# Patient Record
Sex: Female | Born: 1955
Health system: Southern US, Community
[De-identification: ages and names within clinical notes are randomized; demographics above are authoritative.]

## PROBLEM LIST (undated history)

## (undated) DIAGNOSIS — C801 Malignant (primary) neoplasm, unspecified: Secondary | ICD-10-CM

## (undated) DIAGNOSIS — D689 Coagulation defect, unspecified: Secondary | ICD-10-CM

## (undated) DIAGNOSIS — E785 Hyperlipidemia, unspecified: Secondary | ICD-10-CM

## (undated) DIAGNOSIS — I82409 Acute embolism and thrombosis of unspecified deep veins of unspecified lower extremity: Secondary | ICD-10-CM

## (undated) DIAGNOSIS — M1990 Unspecified osteoarthritis, unspecified site: Secondary | ICD-10-CM

## (undated) DIAGNOSIS — L409 Psoriasis, unspecified: Secondary | ICD-10-CM

## (undated) DIAGNOSIS — M722 Plantar fascial fibromatosis: Secondary | ICD-10-CM

## (undated) HISTORY — DX: Hyperlipidemia, unspecified: E78.5

## (undated) HISTORY — PX: TYMPANOSTOMY TUBE PLACEMENT: SHX32

## (undated) HISTORY — PX: TOTAL KNEE ARTHROPLASTY: SHX125

## (undated) HISTORY — PX: TONSILLECTOMY: SUR1361

## (undated) HISTORY — DX: Acute embolism and thrombosis of unspecified deep veins of unspecified lower extremity: I82.409

## (undated) HISTORY — PX: OTHER SURGICAL HISTORY: SHX169

## (undated) HISTORY — PX: WISDOM TOOTH EXTRACTION: SHX21

## (undated) HISTORY — DX: Coagulation defect, unspecified: D68.9

## (undated) HISTORY — DX: Plantar fascial fibromatosis: M72.2

## (undated) HISTORY — DX: Malignant (primary) neoplasm, unspecified: C80.1

## (undated) HISTORY — PX: REPLACEMENT TOTAL KNEE: SUR1224

## (undated) HISTORY — PX: TYMPANIC MEMBRANE REPAIR: SHX294

## (undated) HISTORY — DX: Psoriasis, unspecified: L40.9

---

## 1995-10-11 HISTORY — PX: ABDOMINAL HYSTERECTOMY: SHX81

## 2005-09-22 ENCOUNTER — Ambulatory Visit: Payer: Self-pay | Admitting: Family Medicine

## 2006-04-06 ENCOUNTER — Ambulatory Visit: Payer: Self-pay | Admitting: Family Medicine

## 2009-05-26 ENCOUNTER — Ambulatory Visit: Payer: Self-pay | Admitting: Family Medicine

## 2009-06-18 ENCOUNTER — Ambulatory Visit: Payer: Self-pay | Admitting: Family Medicine

## 2009-06-18 DIAGNOSIS — R5383 Other fatigue: Secondary | ICD-10-CM

## 2009-06-18 DIAGNOSIS — R5381 Other malaise: Secondary | ICD-10-CM | POA: Insufficient documentation

## 2009-06-18 LAB — CONVERTED CEMR LAB: LDL Cholesterol: 178 mg/dL

## 2009-06-21 LAB — CONVERTED CEMR LAB
ALT: 30 units/L (ref 0–35)
Albumin: 3.9 g/dL (ref 3.5–5.2)
BUN: 17 mg/dL (ref 6–23)
Basophils Relative: 0.2 % (ref 0.0–3.0)
Bilirubin, Direct: 0 mg/dL (ref 0.0–0.3)
CO2: 29 meq/L (ref 19–32)
Chloride: 103 meq/L (ref 96–112)
Cholesterol: 240 mg/dL — ABNORMAL HIGH (ref 0–200)
Creatinine, Ser: 1 mg/dL (ref 0.4–1.2)
Direct LDL: 177.5 mg/dL
Eosinophils Absolute: 0.3 10*3/uL (ref 0.0–0.7)
HCT: 40.8 % (ref 36.0–46.0)
Lymphs Abs: 1.5 10*3/uL (ref 0.7–4.0)
MCHC: 34.6 g/dL (ref 30.0–36.0)
MCV: 92.6 fL (ref 78.0–100.0)
Monocytes Absolute: 0.5 10*3/uL (ref 0.1–1.0)
Neutrophils Relative %: 59.9 % (ref 43.0–77.0)
Potassium: 4.4 meq/L (ref 3.5–5.1)
RBC: 4.4 M/uL (ref 3.87–5.11)
TSH: 1.92 microintl units/mL (ref 0.35–5.50)
Total Protein: 6.9 g/dL (ref 6.0–8.3)

## 2009-06-24 ENCOUNTER — Ambulatory Visit: Payer: Self-pay | Admitting: Family Medicine

## 2009-06-24 DIAGNOSIS — E785 Hyperlipidemia, unspecified: Secondary | ICD-10-CM | POA: Insufficient documentation

## 2009-06-24 DIAGNOSIS — E782 Mixed hyperlipidemia: Secondary | ICD-10-CM | POA: Insufficient documentation

## 2009-06-24 LAB — CONVERTED CEMR LAB: Cholesterol, target level: 200 mg/dL

## 2009-07-30 ENCOUNTER — Ambulatory Visit: Payer: Self-pay | Admitting: Internal Medicine

## 2009-08-31 ENCOUNTER — Encounter (INDEPENDENT_AMBULATORY_CARE_PROVIDER_SITE_OTHER): Payer: Self-pay | Admitting: *Deleted

## 2009-09-01 ENCOUNTER — Ambulatory Visit: Payer: Self-pay | Admitting: Internal Medicine

## 2009-09-28 ENCOUNTER — Ambulatory Visit: Payer: Self-pay | Admitting: Internal Medicine

## 2009-09-28 LAB — HM COLONOSCOPY

## 2009-10-19 ENCOUNTER — Ambulatory Visit: Payer: Self-pay | Admitting: Family Medicine

## 2009-12-16 ENCOUNTER — Encounter (INDEPENDENT_AMBULATORY_CARE_PROVIDER_SITE_OTHER): Payer: Self-pay | Admitting: *Deleted

## 2010-01-11 ENCOUNTER — Ambulatory Visit: Payer: Self-pay | Admitting: Family Medicine

## 2010-01-11 LAB — CONVERTED CEMR LAB
Direct LDL: 137.6 mg/dL
HDL: 50.1 mg/dL (ref >39.00)
Total CHOL/HDL Ratio: 4
Triglycerides: 181 mg/dL — ABNORMAL HIGH (ref 0.0–149.0)
VLDL: 36.2 mg/dL (ref 0.0–40.0)

## 2010-01-14 ENCOUNTER — Ambulatory Visit: Payer: Self-pay | Admitting: Family Medicine

## 2010-03-10 ENCOUNTER — Ambulatory Visit: Payer: Self-pay | Admitting: Family Medicine

## 2010-05-14 ENCOUNTER — Telehealth: Payer: Self-pay | Admitting: Family Medicine

## 2010-07-08 ENCOUNTER — Telehealth (INDEPENDENT_AMBULATORY_CARE_PROVIDER_SITE_OTHER): Payer: Self-pay | Admitting: *Deleted

## 2010-08-19 ENCOUNTER — Ambulatory Visit: Payer: Self-pay | Admitting: Family Medicine

## 2010-08-19 LAB — CONVERTED CEMR LAB
ALT: 24 units/L (ref 0–35)
BUN: 12 mg/dL (ref 6–23)
Bilirubin, Direct: 0.1 mg/dL (ref 0.0–0.3)
CO2: 30 meq/L (ref 19–32)
Chloride: 107 meq/L (ref 96–112)
Cholesterol: 239 mg/dL — ABNORMAL HIGH (ref 0–200)
Creatinine, Ser: 1 mg/dL (ref 0.4–1.2)
Direct LDL: 166.9 mg/dL
Eosinophils Absolute: 0.3 10*3/uL (ref 0.0–0.7)
Eosinophils Relative: 4.9 % (ref 0.0–5.0)
Glucose, Bld: 85 mg/dL (ref 70–99)
HCT: 42.7 % (ref 36.0–46.0)
Lymphs Abs: 1.8 10*3/uL (ref 0.7–4.0)
MCHC: 34.7 g/dL (ref 30.0–36.0)
MCV: 92.9 fL (ref 78.0–100.0)
Monocytes Absolute: 0.5 10*3/uL (ref 0.1–1.0)
Neutrophils Relative %: 54.3 % (ref 43.0–77.0)
Platelets: 193 10*3/uL (ref 150.0–400.0)
Potassium: 4.9 meq/L (ref 3.5–5.1)
TSH: 2.02 microintl units/mL (ref 0.35–5.50)
Total Bilirubin: 0.6 mg/dL (ref 0.3–1.2)
Total Protein: 6.6 g/dL (ref 6.0–8.3)
WBC: 5.8 10*3/uL (ref 4.5–10.5)

## 2010-10-01 ENCOUNTER — Ambulatory Visit: Payer: Self-pay | Admitting: Family Medicine

## 2010-11-09 NOTE — Assessment & Plan Note (Signed)
Summary: NECK,R FOOT PAIN/CLE   Vital Signs:  Patient profile:   55 year old female Height:      68 inches Weight:      179.8 pounds BMI:     27.44 Temp:     97.4 degrees F oral Pulse rate:   72 / minute Pulse rhythm:   regular BP sitting:   90 / 68  (left arm) Cuff size:   regular  Vitals Entered By: Benny Lennert CMA Duncan Dull) (March 10, 2010 11:46 AM)  History of Present Illness: Chief complaint neck and right foot pain  55 year old with neck and R foot:  Neck: Has been having some ongoing subtle neck pain , posterior aspect, but also can be constant on the left side.  Not constant or severe. Has been ongoing for several months. no radiculopathy  R midfoot: A couple of weeks ago. Was hurting so bad that she was limping and not walking correctly. Alleve then it came back. Has been hurting more.   Was tingling some on the dorsum. Now, will hurt in the midfoot and deep.  R midfoot - not painful now     Allergies (verified): No Known Drug Allergies  Past History:  Past medical, surgical, family and social histories (including risk factors) reviewed, and no changes noted (except as noted below).  Past Medical History: Reviewed history from 06/24/2009 and no changes required. reputured tympanic membrane plantar fasciitis Hyperlipidemia  Past Surgical History: Reviewed history from 05/26/2009 and no changes required. tubes in ears hysterectomy tympanic membrane surgery tonsillectomy wisdom teeth extraction  Family History: Reviewed history from 05/26/2009 and no changes required. father alive with parkinsons disease and macular degeneration Mother deceased at age 76 with alzehimer's and diabetes She has 1 brother Grandmothers on both sides died of Breast Cancer Grandfathers on both sides dies of heart attacks  Social History: Reviewed history from 05/26/2009 and no changes required. 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 Otherwise the pertinent positives of the ROS are noted above.    Physical Exam  General:  GEN: Well-developed,well-nourished,in no acute distress; alert,appropriate and cooperative throughout examination HEENT: Normocephalic and atraumatic without obvious abnormalities. No apparent alopecia or balding. Ears, externally no deformities PULM: Breathing comfortably in no respiratory distress EXT: No clubbing, cyanosis, or edema PSYCH: Normally interactive. Cooperative during the interview. Pleasant. Friendly and conversant. Not anxious or depressed appearing. Normal, full affect.    Detailed Back/Spine Exam  Cervical Exam:  Inspection-deformity:    Normal Palpation-spinal tenderness:  Abnormal    not spinal tenderness, but L lateral paracervical Range of Motion:    Forward Flexion:   60 degrees    Hyperextension:   75 degrees    Right Lat. Flexion:   45 degrees    Left Lat. Flexion:   45 degrees    Right Lat. Rotation:   80 degrees    Left Lat. Rotation:   80 degrees Spurling Maneuver:    negative   Foot/Ankle Exam  Gait:    Normal heel-toe gait pattern bilaterally.    Skin:    Intact with no scars, lesions, rashes, cafe-au-lait spots or bruising.    Inspection:    Inspection  reveals no deformity, ecchymosis or swelling.   Palpation:    non-tender to palpation over distal leg, medial and lateral ankle, distal achilles tendon, medial and lateral hindfoot, posterior heel, plantar heel, midfoot and forefoot bilaterally.   Foot Exam:    Right:    Inspection:  Normal    Palpation:  Normal    Stability:  stable    Tenderness:  no    Swelling:  no    Erythema:   no    Range of Motion:       Hallux MTP Dorsiflex-Active: 60       Hallux MTP Plantar Flex-Active: 45       Hallux IP-Active: full       Hallux MTP Dorsiflex-Passive: 60       Hallux MTP Plantar Flex-Passive: 45       Hallux IP-Passive: full    Left:    Inspection:  Normal    Palpation:  Normal    Stability:  stable    Tenderness:  no    Swelling:  no    Erythema:  no    Range of Motion:       Hallux MTP Dorsiflex-Active: 60       Hallux MTP Plantar Flex-Active: 45       Hallux IP-Active: full       Hallux MTP Dorsiflex-Passive: 60       Hallux MTP Plantar Flex-Passive: 45       Hallux IP-Passive: full   Impression & Recommendations:  Problem # 1:  CERVICALGIA (ICD-723.1) Assessment New No red flags moist heat reviewed ROM protocols from [facility]  formal PT  Her updated medication list for this problem includes:    Diclofenac Sodium 75 Mg Tbec (Diclofenac sodium) .Marland Kitchen... 1 by mouth bid. take with food    Tizanidine Hcl 4 Mg Tabs (Tizanidine hcl) .Marland Kitchen... 1  by mouth at bedtime  Orders: Physical Therapy Referral (PT)  Problem # 2:  FOOT, PAIN (ICD-719.47) Assessment: New resolved.  mild midfoot sprain likely. follow only - reassured  Complete Medication List: 1)  Womens Multivitamin Plus Tabs (Multiple vitamins-minerals) .... One a day 2)  Calcium 500 Mg Tabs (Calcium carbonate) .... Once a day 3)  Meclizine Hcl 25 Mg Tabs (Meclizine hcl) .Marland Kitchen.. 1 by mouth 4 times a day as needed vertigo 4)  Diclofenac Sodium 75 Mg Tbec (Diclofenac sodium) .Marland Kitchen.. 1 by mouth bid. take with food 5)  Tizanidine Hcl 4 Mg Tabs (Tizanidine hcl) .Marland Kitchen.. 1  by mouth at bedtime  Patient Instructions: 1)  Referral Appointment Information 2)  Day/Date: 3)  Time: 4)  Place/MD: 5)  Address: 6)  Phone/Fax: 7)  Patient given appointment information. Information/Orders faxed/mailed.  Prescriptions: TIZANIDINE HCL 4 MG TABS (TIZANIDINE HCL) 1  by mouth at bedtime  #30 x 1   Entered and Authorized by:    Hannah Beat MD   Signed by:   Hannah Beat MD on 03/10/2010   Method used:   Electronically to        [facility]* (retail)       [address]       [place], [place]  [address]       Ph: [phone]       Fax: [phone]   RxID:   [id number] DICLOFENAC SODIUM 75 MG TBEC (DICLOFENAC SODIUM) 1 by mouth bid. take with food  #60 x 2   Entered and Authorized by:   Hannah Beat MD   Signed by:   Karleen Hampshire  Ida Uppal MD on 03/10/2010   Method used:   Electronically to        [facility]* (retail)       [address]       [place], [place]  [address]       Ph: [phone]       Fax: [phone]   RxID:   [id number]   Current Allergies (reviewed today): No known allergies

## 2010-11-09 NOTE — Letter (Signed)
Summary: [place] No Show Letter  [place] [facility]  [address] [place], [place] [address]   Phone: [phone]  Fax: [phone]    12/16/2009 MRN: [id number]  Angela Moses [address] [place], [place]  [address]   Dear Ms. Machamer,   Our records indicate that you missed your scheduled appointment with ___lab__________________ on _3-9-11___________.  Please contact this office to reschedule your appointment as soon as possible.  It is important that you keep your scheduled appointments with your physician, so we can provide you the best care possible.  Please be advised that there may be a charge for "no show" appointments.    Sincerely,   [place] at [facility]

## 2010-11-09 NOTE — Assessment & Plan Note (Signed)
Summary: 6 M F/U DLO   Vital Signs:  Patient profile:   55 year old female Height:      68 inches Weight:      179.6 pounds BMI:     27.41 Temp:     97.8 degrees F oral Pulse rate:   72 / minute Pulse rhythm:   regular BP sitting:   108 / 80  (left arm) Cuff size:   regular  Vitals Entered By: Benny Lennert CMA Duncan Dull) (January 14, 2010 3:46 PM)  History of Present Illness: Chief complaint 6 month follow up  [employer], two or three times a week.   f/u chol dramatically improved  Clinical Review Panels:  Lipid Management   Cholesterol:  203 (01/11/2010)   LDL (bad choesterol):  178 (06/18/2009)   HDL (good cholesterol):  50.10 (01/11/2010)   Allergies (verified): No Known Drug Allergies  Review of Systems       ROS: GEN: No acute illnesses, no fevers, chills, sweats, fatigue, weight loss, or URI sx. GI: No n/v/d Pulm: No SOB, cough, wheezing Interactive and getting along well at home.  Otherwise, ROS is as per the HPI.   Physical Exam  General:  GEN: WDWN, NAD, Non-toxic, A & O x 3 HEENT: Atraumatic, Normocephalic. Neck supple. No masses, No LAD. Ears and Nose: No external deformity. CV: RRR, No M/G/R. No JVD. No thrill. No extra heart sounds. PULM: CTA B, no wheezes, crackles, rhonchi. No retractions. No resp. distress. No accessory muscle use. EXTR: No c/c/e NEURO: Normal gait.  PSYCH: Normally interactive. Conversant. Not depressed or anxious appearing.  Calm demeanor.     Impression & Recommendations:  Problem # 1:  HYPERLIPIDEMIA (ICD-272.4) Assessment Improved  doing much better  Labs Reviewed: SGOT: 27 (06/18/2009)   SGPT: 30 (06/18/2009)  Lipid Goals: Chol Goal: 200 (06/24/2009)   HDL Goal: 40 (06/24/2009)   LDL Goal: 160 (06/24/2009)   TG Goal: 150 (06/24/2009)  Prior 10 Yr Risk Heart Disease: 8 % (06/24/2009)   HDL:50.10 (01/11/2010), 50.10 (06/18/2009)  LDL:178 (06/18/2009)  Chol:203 (01/11/2010), 240 (06/18/2009)  Trig:181.0  (01/11/2010), 101.0 (06/18/2009)  Complete Medication List: 1)  Womens Multivitamin Plus Tabs (Multiple vitamins-minerals) .... One a day 2)  Calcium 500 Mg Tabs (Calcium carbonate) .... Once a day 3)  Meclizine Hcl 25 Mg Tabs (Meclizine hcl) .Marland Kitchen.. 1 by mouth 4 times a day as needed vertigo 4)  Diazepam 5 Mg Tabs (Diazepam) .Marland Kitchen.. 1 by mouth two times a day as needed vertigo  Patient Instructions: 1)  CPX end of sept or later. 2)   1 week before 3)  future fasting lipid panel 4)  272.4 5)  BMP: v77.1 6)  Hepatic Function Panel: v58.69 7)  CBC with diff: 780.79 8)  TSH: 780.79  9)  Vit D: 780.79  Current Allergies (reviewed today): No known allergies

## 2010-11-09 NOTE — Progress Notes (Signed)
----   Converted from flag ---- ---- 07/08/2010 2:43 PM, Hannah Beat MD wrote: FLP: v77.91 BMP: v77.1 Hepatic Function Panel: v58.69 CBC with diff: 780.79 TSH: 780.79   ---- 07/08/2010 10:07 AM, Liane Comber CMA (AAMA) wrote: Lab orders please! Good Morning! This pt is scheduled for cpx labs Monday, which labs to draw and dx codes to use? Thanks Tasha ------------------------------

## 2010-11-09 NOTE — Assessment & Plan Note (Signed)
Summary: 2:15 R SIDE PAIN/CLE   Vital Signs:  Patient profile:   55 year old female Height:      68 inches Weight:      187.4 pounds BMI:     28.60 Temp:     97.8 degrees F oral Pulse rate:   72 / minute Pulse rhythm:   regular BP sitting:   110 / 70  (left arm) Cuff size:   regular  Vitals Entered By: Benny Lennert CMA Duncan Dull) (October 19, 2009 2:44 PM)  History of Present Illness: Chief complaint back pain on right under arm and around also has rash  55 year old female:  Pleasant lady who started to have burning and stinging side and flank pain on the right side last tues or wed and now has developed a reddish, elevated rash that is vesicular in nature. Continues to have this burning pain. No n/v/d, no URI signs. No itching, only pain. No ingestion or other problems aside from this.  Pain started tuesday or wednesday, a burning or stinging pain.  REVIEW OF SYSTEMS GEN: Acute illness details above. CV: No chest pain or SOB GI: No noted N or V Otherwise, pertinent positives and negatives are noted in the HPI.   Allergies: No Known Drug Allergies  Past History:  Past medical, surgical, family and social histories (including risk factors) reviewed, and no changes noted (except as noted below).  Past Medical History: Reviewed history from 06/24/2009 and no changes required. reputured tympanic membrane plantar fasciitis Hyperlipidemia  Past Surgical History: Reviewed history from 05/26/2009 and no changes required. tubes in ears hysterectomy tympanic membrane surgery tonsillectomy wisdom teeth extraction  Family History: Reviewed history from 05/26/2009 and no changes required. father alive with parkinsons disease and macular degeneration Mother deceased at age 30 with alzehimer's and diabetes She has 1 brother Grandmothers on both sides died of Breast Cancer Grandfathers on both sides dies of heart attacks  Social History: Reviewed history from 05/26/2009  and no changes required. 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 No apparent alopecia or balding. Ears:  no external deformities.   Nose:  no external deformity.   Chest Wall:  No deformities, masses, or tenderness noted. Lungs:  normal respiratory effort.   Abdomen:  no cvat Extremities:  No clubbing, cyanosis, edema, or deformity noted with normal full range of motion of all joints.   Neurologic:  alert & oriented X3, sensation intact to light touch, and gait normal.   Skin:  dermatomal distrubution rash, elevated and vescular and purple in color, L side, 1 lesion on upper breast Psych:  Cognition and judgment appear intact. Alert and cooperative with normal attention span and concentration. No apparent delusions, illusions, hallucinations   Impression & Recommendations:  Problem # 1:  SHINGLES (ICD-053.9) Assessment New Discussed treatment, pain, post-infection pain  Valtrex  Complete Medication List: 1)  Womens Multivitamin Plus Tabs (Multiple vitamins-minerals) .... One a day 2)  Calcium 500 Mg Tabs (Calcium carbonate) .... Once a day 3)  Meclizine Hcl 25 Mg Tabs (Meclizine hcl) .Marland Kitchen.. 1 by mouth 4 times a day as needed vertigo 4)  Diazepam 5 Mg Tabs (Diazepam) .Marland Kitchen.. 1 by mouth two times a day as needed vertigo 5)  Valacyclovir Hcl 1 Gm Tabs (Valacyclovir hcl) .Marland Kitchen.. 1 by mouth three times a day  Prescriptions: VALACYCLOVIR HCL 1 GM TABS (VALACYCLOVIR HCL) 1 by mouth three times a day  #21 x 0   Entered and Authorized by:   Hannah Beat MD   Signed by:   Hannah Beat MD on 10/19/2009   Method used:   Electronically to        [facility]* (retail)       [address]       [place], [place]   [address]       Ph: [phone]       Fax: [phone]   RxID:   [id number]   Prior Medications: WOMENS MULTIVITAMIN PLUS  TABS (MULTIPLE VITAMINS-MINERALS) one a day CALCIUM 500 MG TABS (CALCIUM CARBONATE) once a day MECLIZINE HCL 25 MG TABS (MECLIZINE HCL) 1 by mouth 4 times a day as needed vertigo DIAZEPAM 5 MG TABS (DIAZEPAM) 1 by mouth two times a day as needed vertigo Current Allergies: No known allergies

## 2010-11-09 NOTE — Progress Notes (Signed)
Summary: wants zostavax  Phone Note Call from Patient Call back at [phone]   Caller: Patient Call For: Angela Beat MD Summary of Call: Pt is asking if she can get zostavax, since she had shingles in the past.  She is going to call her insurance company to make sure it will be covered and she will call back.             Lowella Petties CMA  May 18, 2010 4:06 PM   Follow-up for Phone Call        this is primarily an insurance coverage issue. Typically not done until older -- if insurance will cover it, then it is not harmful and OK to do. Follow-up by: Angela Beat MD,  May 18, 2010 4:11 PM

## 2010-11-10 ENCOUNTER — Encounter: Payer: Self-pay | Admitting: Family Medicine

## 2010-11-11 NOTE — Assessment & Plan Note (Signed)
Summary: CPX/JRR   Vital Signs:  Patient profile:   55 year old female Height:      68 inches Weight:      175.0 pounds BMI:     26.70 Temp:     98.4 degrees F oral Pulse rate:   72 / minute Pulse rhythm:   regular BP sitting:   112 / 80  (left arm) Cuff size:   large  Vitals Entered By: Benny Lennert CMA Duncan Dull) (October 01, 2010 8:38 AM)  History of Present Illness: Chief complaint cpx  s/p hysterectomy  pap mammo - 03/2010 normal  Has a tube in the left ear - Dr. Dorma Russell is the ear Dr.     Lipid Management History:      Negative NCEP/ATP III risk factors include female age less than 64 years old, no history of early menopause without estrogen hormone replacement, non-diabetic, no family history for ischemic heart disease, non-tobacco-user status, non-hypertensive, no ASHD (atherosclerotic heart disease), no prior stroke/TIA, no peripheral vascular disease, and no history of aortic aneurysm.        The patient states that she knows about the "Therapeutic Lifestyle Change" diet.  The patient expresses understanding of adjunctive measures for cholesterol lowering.  Adjunctive measures started by the patient include aerobic exercise, fiber, ASA, folic acid, omega-3 supplements, sitostanol esters, limit alcohol consumpton, and weight reduction.    Preventive Screening-Counseling & Management  Alcohol-Tobacco     Alcohol drinks/day: <1     Alcohol type: beer     Alcohol Counseling: not indicated; use of alcohol is not excessive or problematic     Smoking Status: never  Caffeine-Diet-Exercise     Diet Counseling: to improve diet; diet is suboptimal     Does Patient Exercise: yes     Type of exercise: [employer]     Exercise Counseling: to improve exercise regimen  Hep-HIV-STD-Contraception     HIV Risk: no risk noted     STD Risk: no risk noted     SBE Education/Counseling: to perform regular SBE      Sexual History:  currently monogamous.        Drug Use:  never.     Clinical Review Panels:  Prevention   Last Mammogram:  normal (03/10/2010)   Last Pap Smear:  normal (12/08/2008)   Last Colonoscopy:  DONE (09/28/2009)  Immunizations   Last Tetanus Booster:  given (10/01/2010)   Last Flu Vaccine:  given (10/01/2010)  Lipid Management   Cholesterol:  239 (08/19/2010)   LDL (bad choesterol):  178 (06/18/2009)   HDL (good cholesterol):  54.90 (08/19/2010)  Diabetes Management   Creatinine:  1.0 (08/19/2010)   Last Flu Vaccine:  given (10/01/2010)  CBC   WBC:  5.8 (08/19/2010)   RBC:  4.60 (08/19/2010)   Hgb:  14.8 (08/19/2010)   Hct:  42.7 (08/19/2010)   Platelets:  193.0 (08/19/2010)   MCV  92.9 (08/19/2010)   MCHC  34.7 (08/19/2010)   RDW  13.0 (08/19/2010)   PMN:  54.3 (08/19/2010)   Lymphs:  32.0 (08/19/2010)   Monos:  8.1 (08/19/2010)   Eosinophils:  4.9 (08/19/2010)   Basophil:  0.7 (08/19/2010)  Complete Metabolic Panel   Glucose:  85 (08/19/2010)   Sodium:  144 (08/19/2010)   Potassium:  4.9 (08/19/2010)   Chloride:  107 (08/19/2010)   CO2:  30 (08/19/2010)   BUN:  12 (08/19/2010)   Creatinine:  1.0 (08/19/2010)   Albumin:  3.9 (08/19/2010)   Total Protein:  6.6 (08/19/2010)   Calcium:  10.0 (08/19/2010)   Total Bili:  0.6 (08/19/2010)   Alk Phos:  80 (08/19/2010)   SGPT (ALT):  24 (08/19/2010)   SGOT (AST):  23 (08/19/2010)   Current Problems (verified): 1)  Health Maintenance Exam  (ICD-V70.0) 2)  Hyperlipidemia  (ICD-272.4) 3)  Other Malaise and Fatigue  (ICD-780.79) 4)  Screening For Diabetes Mellitus  (ICD-V77.1)  Allergies (verified): No Known Drug Allergies  Past History:  Past medical, surgical, family and social histories (including risk factors) reviewed, and no changes noted (except as noted below).  Past Medical History: Reviewed history from 06/24/2009 and no changes required. reputured tympanic membrane plantar fasciitis Hyperlipidemia  Past Surgical History: Reviewed history from  05/26/2009 and no changes required. tubes in ears hysterectomy tympanic membrane surgery tonsillectomy wisdom teeth extraction  Family History: Reviewed history from 05/26/2009 and no changes required. father alive with parkinsons disease and macular degeneration Mother deceased at age 76 with alzehimer's and diabetes She has 1 brother Grandmothers on both sides died of Breast Cancer Grandfathers on both sides dies of heart attacks  Social History: Reviewed history from 05/26/2009 and no changes required. 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  Cardiovascular: Denies chest pains, palpitations, syncope, dyspnea on exertion,  Respiratory: Denies cough, dyspnea at rest, excessive sputum,wheeezing GI: Denies nausea, vomiting, diarrhea, constipation, change in bowel habits, abdominal pain, melena, hematochezia GU: Denies dysuria, hematuria, discharge, urinary frequency, urinary hesitancy, nocturia, incontinence, genital sores, decreased libido Musculoskeletal: OCC NECK AND JOINT ACHES Derm: Denies rash, itching Neuro: Denies  paresthesias, frequent falls, frequent headaches, and difficulty walking.  Psych: Denies depression, anxiety Endocrine: Denies cold intolerance, heat intolerance, polydipsia, polyphagia, polyuria, and unusual weight change.  Heme: Denies enlarged lymph nodes Allergy: No hayfever   Otherwise, the pertinent positives and negatives are listed above and in the HPI, otherwise a full review of systems has been reviewed and is negative unless noted  positive.   Physical Exam  General:  Well-developed,well-nourished,in no acute distress; alert,appropriate and cooperative throughout examination Head:  Normocephalic and atraumatic without obvious abnormalities. No apparent alopecia or balding. Eyes:  pupils equal, pupils round, pupils reactive to light, pupils react to accomodation, corneas and lenses clear, and no injection.   Ears:  External ear exam shows no significant lesions or deformities.  Otoscopic examination reveals clear canals, tympanic membranes are intact bilaterally without bulging, retraction, inflammation or discharge. Hearing is grossly normal bilaterally. Nose:  External nasal examination shows no deformity or inflammation. Nasal mucosa are pink and moist without lesions or exudates. Mouth:  Oral mucosa and oropharynx without lesions or exudates.  Teeth in good repair. Neck:  No deformities, masses, or tenderness noted. Chest Wall:  No deformities, masses, or tenderness noted. Lungs:  Normal respiratory effort, chest expands symmetrically. Lungs are clear to auscultation, no crackles or wheezes. Heart:  Normal rate and regular rhythm. S1 and S2 normal without gallop, murmur, click, rub or other extra sounds. Abdomen:  Bowel sounds positive,abdomen soft and non-tender without masses, organomegaly or hernias noted. Extremities:  No clubbing, cyanosis, edema, or deformity noted with normal full range of motion  of all joints.   Neurologic:  alert & oriented X3 and gait normal.   Skin:  Intact without suspicious lesions or rashes Cervical Nodes:  No lymphadenopathy noted Psych:  Cognition and judgment appear intact. Alert and cooperative with normal attention span and concentration. No apparent delusions, illusions, hallucinations   Impression & Recommendations:  Problem # 1:  HEALTH MAINTENANCE EXAM (ICD-V70.0) The patient's preventative maintenance and recommended screening tests for an annual wellness exam were reviewed  in full today. Brought up to date unless services declined.  Counselled on the importance of diet, exercise, and its role in overall health and mortality. The patient's FH and SH was reviewed, including their home life, tobacco status, and drug and alcohol status.   Problem # 2:  HYPERLIPIDEMIA (ICD-272.4) Assessment: New New onset treatment  reviewed [place] trial data with patient recheck 3 mo  Her updated medication list for this problem includes:    Lipitor 20 Mg Tabs (Atorvastatin calcium) .Marland Kitchen... 1 by mouth at bedtime (generic when available)  Labs Reviewed: SGOT: 23 (08/19/2010)   SGPT: 24 (08/19/2010)  Lipid Goals: Chol Goal: 200 (06/24/2009)   HDL Goal: 40 (06/24/2009)   LDL Goal: 160 (06/24/2009)   TG Goal: 150 (06/24/2009)  Prior 10 Yr Risk Heart Disease: 8 % (06/24/2009)   HDL:54.90 (08/19/2010), 50.10 (01/11/2010)  LDL:178 (06/18/2009)  Chol:239 (08/19/2010), 203 (01/11/2010)  Trig:179.0 (08/19/2010), 181.0 (01/11/2010)  Complete Medication List: 1)  Womens Multivitamin Plus Tabs (Multiple vitamins-minerals) .... One a day 2)  Calcium 500 Mg Tabs (Calcium carbonate) .... Once a day 3)  Lipitor 20 Mg Tabs (Atorvastatin calcium) .Marland Kitchen.. 1 by mouth at bedtime (generic when available)  Lipid Assessment/Plan:      The patient's calculated 10 year coronary heart disease risk is 8 %.  Based on NCEP/ATP III, the patient's risk factor category is "0-1 risk factors".  From this information, the patient's calculated lipid goals are as follows: Total cholesterol goal is 200; LDL cholesterol goal is 160; HDL cholesterol goal is 40; Triglyceride goal is 150.  Her LDL cholesterol goal has not been met.  She has been counseled on adjunctive measures for lowering her cholesterol and has been provided with dietary instructions.    Patient Instructions: 1)  recheck 3-4 months, labs before 2)  FLP, HFP: 272.4, v58.69 Prescriptions: LIPITOR 20 MG TABS (ATORVASTATIN CALCIUM) 1 by mouth at  bedtime (generic when available)  #30 x 11   Entered and Authorized by:   Hannah Beat MD   Signed by:   Hannah Beat MD on 10/01/2010   Method used:   Print then Give to Patient   RxID:   [id number]    Orders Added: 1)  Est. Patient 40-64 years [99396] 2)  Est. Patient Level III [42595]   Immunization History:  Tetanus/Td Immunization History:    Tetanus/Td:  historical (03/10/2006)  Influenza Immunization History:    Influenza:  historical (07/10/2010)   Immunization History:  Tetanus/Td Immunization History:    Tetanus/Td:  Historical (03/10/2006)  Influenza Immunization History:    Influenza:  Historical (07/10/2010)  Current Allergies (reviewed today): No known allergies    Prevention & Chronic Care Immunizations   Influenza vaccine: given  (10/01/2010)   Influenza vaccine due: 10/02/2011    Tetanus booster: 10/01/2010: given   Tetanus booster due: 10/01/2020    Pneumococcal vaccine: Not documented  Colorectal Screening   Hemoccult: Not documented    Colonoscopy: DONE  (09/28/2009)   Colonoscopy action/deferral: GI referral  (06/24/2009)  Colonoscopy due: 09/2019  Other Screening   Pap smear: normal  (12/08/2008)   Pap smear action/deferral: Deferred  (10/01/2010)   Pap smear due: 12/08/2009    Mammogram: normal  (03/10/2010)   Mammogram action/deferral: Deferred  (10/01/2010)   Mammogram due: 03/11/2011   Smoking status: never  (10/01/2010)  Lipids   Total Cholesterol: 239  (08/19/2010)   LDL: 178  (06/18/2009)   LDL Direct: 166.9  (08/19/2010)   HDL: 54.90  (08/19/2010)   Triglycerides: 179.0  (08/19/2010)    SGOT (AST): 23  (08/19/2010)   SGPT (ALT): 24  (08/19/2010)   Alkaline phosphatase: 80  (08/19/2010)   Total bilirubin: 0.6  (08/19/2010)  Self-Management Support :    Lipid self-management support: Education handout, Psychologist, forensic, Resources for patients handout  (06/24/2009)    Flu Vaccine  Result Date:  10/01/2010 Flu Vaccine Result:  given TD Result Date:  10/01/2010 TD Result:  given Last PAP:  normal (12/08/2008 12:02:34 PM) PAP Result Date:  03/10/2010 PAP Next Due:  Not Indicated Last Mammogram:  normal (12/08/2008 12:02:34 PM) Mammogram Result Date:  03/10/2010 Mammogram Result:  normal

## 2010-12-21 ENCOUNTER — Encounter (INDEPENDENT_AMBULATORY_CARE_PROVIDER_SITE_OTHER): Payer: Self-pay | Admitting: *Deleted

## 2010-12-21 ENCOUNTER — Other Ambulatory Visit: Payer: Self-pay | Admitting: Family Medicine

## 2010-12-21 ENCOUNTER — Other Ambulatory Visit (INDEPENDENT_AMBULATORY_CARE_PROVIDER_SITE_OTHER): Payer: 59

## 2010-12-21 DIAGNOSIS — E785 Hyperlipidemia, unspecified: Secondary | ICD-10-CM

## 2010-12-21 LAB — HEPATIC FUNCTION PANEL
ALT: 21 U/L (ref 0–35)
Albumin: 3.7 g/dL (ref 3.5–5.2)
Alkaline Phosphatase: 73 U/L (ref 39–117)
Total Protein: 6 g/dL (ref 6.0–8.3)

## 2010-12-21 LAB — LIPID PANEL
Cholesterol: 152 mg/dL (ref 0–200)
LDL Cholesterol: 81 mg/dL (ref 0–99)
Triglycerides: 105 mg/dL (ref 0.0–149.0)
VLDL: 21 mg/dL (ref 0.0–40.0)

## 2010-12-27 ENCOUNTER — Ambulatory Visit (INDEPENDENT_AMBULATORY_CARE_PROVIDER_SITE_OTHER): Payer: 59 | Admitting: Family Medicine

## 2010-12-27 ENCOUNTER — Encounter: Payer: Self-pay | Admitting: Family Medicine

## 2010-12-27 DIAGNOSIS — R42 Dizziness and giddiness: Secondary | ICD-10-CM

## 2010-12-27 DIAGNOSIS — M542 Cervicalgia: Secondary | ICD-10-CM | POA: Insufficient documentation

## 2010-12-27 DIAGNOSIS — M25569 Pain in unspecified knee: Secondary | ICD-10-CM

## 2010-12-27 DIAGNOSIS — E785 Hyperlipidemia, unspecified: Secondary | ICD-10-CM

## 2011-01-06 NOTE — Assessment & Plan Note (Signed)
Summary: 4 month f/u DLO   Vital Signs:  Patient profile:   55 year old female Height:      68 inches Weight:      183.50 pounds BMI:     28.00 Temp:     98.0 degrees F oral Pulse rate:   72 / minute Pulse rhythm:   regular BP sitting:   110 / 70  (left arm) Cuff size:   large  Vitals Entered By: Benny Lennert CMA [facility] Dull) (December 27, 2010 8:26 AM)  History of Present Illness: Chief complaint 4 month follow up (cholesterol)  chol: tol meds fine w/o SE. no myalgias. specific joint pains. Lipid Panel: reviewed last resultsChol:     152 (12/21/2010 11:07:44 AM)HDL:     50.10 (12/21/2010 11:07:44 AM)LDL:      81 (12/21/2010 11:07:44 AM)  knee pain: R knee, mild pain, mild effusion, no specific injury. no locking up or catching.   neck pain: decreased ROM, rotation to the L, no specific injury, no radiculopathy,no weakness or numbness  vertigo: intermittent, rare. Has a L tympanostomy in place, a couple of months ago, worsened with looking to the L while lying down sleeping.  ROS: as above. no fever or chills.  Allergies (verified): No Known Drug Allergies  Past History:  Past medical, surgical, family and social histories (including risk factors) reviewed, and no changes noted (except as noted below).  Past Medical History: Reviewed history from 06/24/2009 and no changes required. reputured tympanic membrane plantar fasciitis Hyperlipidemia  Past Surgical History: Reviewed history from 05/26/2009 and no changes required. tubes in ears hysterectomy tympanic membrane surgery tonsillectomy wisdom teeth extraction  Family History: Reviewed history from 05/26/2009 and no changes required. father alive with parkinsons disease and macular degeneration Mother deceased at age 66 with alzehimer's and diabetes She has 1 brother Grandmothers on both sides died of Breast Cancer Grandfathers on both sides dies of heart attacks  Social History: Reviewed history from  05/26/2009 and no changes required. 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 No apparent alopecia or balding. Ears:  External ear exam shows no significant lesions or deformities.  Otoscopic examination reveals clear canals, L tympanostomy tube in place. R retracted TM Mouth:  Oral mucosa and oropharynx without lesions or exudates.  Teeth in good repair. Neck:  No deformities, masses, or tenderness noted. Lungs:  Normal respiratory effort, chest expands symmetrically. Lungs are clear to auscultation, no crackles or wheezes. Heart:  Normal rate and regular rhythm. S1 and S2 normal without gallop, murmur, click, rub or other extra sounds. Msk:  Gait: Normal heel toe pattern ROM: WNL Effusion: mild effusion Echymosis or edema: none Patellar tendon NT Painful PLICA: neg Patellar grind: negative Medial and lateral patellar facet loading: negative medial and lateral joint lines:NT Mcmurray's neg Flexion-pinch neg Varus and valgus stress: stable Lachman: neg Ant and Post drawer: neg Hip abduction, IR, ER: WNL Hip flexion str: 5/5 Hip abd: 5/5 Quad: 5/5 VMO atrophy:No Hamstring concentric and eccentric: 5/5   CERVICAL SPINE: decrease, lateral bending and L rotation by approx 40%. NT spinous processes. spurlings neg Neurologic:  alert & oriented X3 and gait normal.   Cervical Nodes:  No lymphadenopathy noted Psych:  Cognition and judgment appear intact. Alert and cooperative with normal attention span and concentration. No apparent delusions, illusions, hallucinations  Impression & Recommendations:  Problem # 1:  HYPERLIPIDEMIA (ICD-272.4) Assessment Improved  Her updated medication list for this problem  includes:    Lipitor 20 Mg Tabs (Atorvastatin calcium) .Marland Kitchen... 1 by mouth at bedtime (generic when available)  Labs Reviewed: SGOT: 19 (12/21/2010)   SGPT: 21 (12/21/2010)  Lipid Goals: Chol Goal: 200 (06/24/2009)   HDL Goal: 40 (06/24/2009)   LDL Goal: 160 (06/24/2009)   TG Goal: 150 (06/24/2009)  Prior 10 Yr Risk Heart Disease: 8 % (06/24/2009)   HDL:50.10 (12/21/2010), 54.90 (08/19/2010)  LDL:81 (12/21/2010), 178 (81/19/1478)  Chol:152 (12/21/2010), 239 (08/19/2010)  Trig:105.0 (12/21/2010), 179.0 (08/19/2010)  Problem # 2:  VERTIGO (ICD-780.4) Assessment: Unchanged meclizine or valium as needed   Her updated medication list for this problem includes:    Meclizine Hcl 25 Mg Tabs (Meclizine hcl) .Marland Kitchen... 1 by mouth q 8 hours as needed vertigo  Problem # 3:  CERVICALGIA (ICD-723.1) Assessment: Deteriorated work on ROM moist heat muscle relaxants / nsaids as needed reasonable  Problem # 4:  KNEE PAIN, RIGHT (ICD-719.46) Assessment: New most likely oa, mild flare  Complete Medication List: 1)  Womens Multivitamin Plus Tabs (Multiple vitamins-minerals) .... One a day 2)  Calcium 500 Mg Tabs (Calcium carbonate) .... Once a day 3)  Lipitor 20 Mg Tabs (Atorvastatin calcium) .Marland Kitchen.. 1 by mouth at bedtime (generic when available) 4)  Glucosamine 500 Mg Caps (Glucosamine sulfate) .... Take one tablet two times a day 5)  Meclizine Hcl 25 Mg Tabs (Meclizine hcl) .Marland Kitchen.. 1 by mouth q 8 hours as needed vertigo 6)  Diazepam 5 Mg Tabs (Diazepam) .Marland Kitchen.. 1 by mouth q 8 hours as needed vertigo Prescriptions: DIAZEPAM 5 MG TABS (DIAZEPAM) 1 by mouth q 8 hours as needed vertigo  #20 x 0   Entered and Authorized by:   Hannah Beat MD   Signed by:   Hannah Beat MD on 12/27/2010   Method used:   Print then Give to Patient   RxID:   [id number] MECLIZINE HCL 25 MG TABS (MECLIZINE HCL) 1 by mouth q 8 hours as needed vertigo  #30 x 5   Entered and Authorized by:   Hannah Beat MD   Signed by:    Hannah Beat MD on 12/27/2010   Method used:   Electronically to        [facility]* (retail)       [address]       [place], [place]  [address]       Ph: [phone]       Fax: [phone]   RxID:   [id number]    Orders Added: 1)  Est. Patient Level IV [32951]    Current Allergies (reviewed today): No known allergies

## 2011-04-12 ENCOUNTER — Encounter: Payer: Self-pay | Admitting: Family Medicine

## 2011-09-13 ENCOUNTER — Ambulatory Visit (INDEPENDENT_AMBULATORY_CARE_PROVIDER_SITE_OTHER): Payer: 59 | Admitting: Psychology

## 2011-09-13 DIAGNOSIS — F341 Dysthymic disorder: Secondary | ICD-10-CM

## 2011-10-21 ENCOUNTER — Other Ambulatory Visit: Payer: Self-pay | Admitting: *Deleted

## 2011-10-21 MED ORDER — ATORVASTATIN CALCIUM 20 MG PO TABS: 20.0000 mg | ORAL_TABLET | Freq: Every day | ORAL | Status: DC

## 2011-12-26 ENCOUNTER — Ambulatory Visit (INDEPENDENT_AMBULATORY_CARE_PROVIDER_SITE_OTHER): Payer: 59 | Admitting: Psychology

## 2011-12-26 DIAGNOSIS — F341 Dysthymic disorder: Secondary | ICD-10-CM

## 2012-02-06 ENCOUNTER — Other Ambulatory Visit: Payer: Self-pay | Admitting: *Deleted

## 2012-02-06 MED ORDER — ATORVASTATIN CALCIUM 20 MG PO TABS: 20.0000 mg | ORAL_TABLET | Freq: Every day | ORAL | Status: DC

## 2012-03-07 ENCOUNTER — Ambulatory Visit (INDEPENDENT_AMBULATORY_CARE_PROVIDER_SITE_OTHER): Payer: 59 | Admitting: Family Medicine

## 2012-03-07 ENCOUNTER — Encounter: Payer: Self-pay | Admitting: Family Medicine

## 2012-03-07 VITALS — BP 120/70 | HR 73 | Temp 98.6°F | Ht 68.0 in | Wt 189.0 lb

## 2012-03-07 DIAGNOSIS — E785 Hyperlipidemia, unspecified: Secondary | ICD-10-CM

## 2012-03-07 DIAGNOSIS — M25569 Pain in unspecified knee: Secondary | ICD-10-CM

## 2012-03-07 DIAGNOSIS — R609 Edema, unspecified: Secondary | ICD-10-CM

## 2012-03-07 DIAGNOSIS — M722 Plantar fascial fibromatosis: Secondary | ICD-10-CM

## 2012-03-07 DIAGNOSIS — Z79899 Other long term (current) drug therapy: Secondary | ICD-10-CM

## 2012-03-07 MED ORDER — ATORVASTATIN CALCIUM 20 MG PO TABS: 20.0000 mg | ORAL_TABLET | Freq: Every day | ORAL | Status: DC

## 2012-03-07 NOTE — Progress Notes (Signed)
Patient Name: Angela Moses Date of Birth: 1956-04-12 Age: 56 y.o. Medical Record Number: [id number] Gender: female Date of Encounter: 03/07/2012  History of Present Illness:  Angela Moses is a 56 y.o. very pleasant female patient who presents with the following:  Needs cholesterol refilled.  Lipids: Doing well, stable. Tolerating meds fine with no SE. Panel reviewed with patient.  Lipids:    Component Value Date/Time   CHOL 152 12/21/2010 1107   TRIG 105.0 12/21/2010 1107   HDL 50.10 12/21/2010 1107   LDLDIRECT 166.9 08/19/2010 0853   VLDL 21.0 12/21/2010 1107   CHOLHDL 3 12/21/2010 1107    Lab Results  Component Value Date   ALT 21 12/21/2010   AST 19 12/21/2010   ALKPHOS 73 12/21/2010   BILITOT 0.5 12/21/2010   Was diagnosed with PF --- L foot, several month ago. Has had it for at least a few months. Does not hurt all the time. 1 alleve a day. The patient presents with a several month long history of heel pain. This is notable for worsening pain first thing in the morning when arising and standing after sitting.   Prior foot or ankle fractures: none Prior operations: none Orthotics or bracing: none Medications: none PT or home rehab: none Night splints: no Ice massage: no Ball massage: no  Metatarsal pain: at L bunionette   L knee hurts a little bit and will grab her some. With a certain way she will move. No injury Ankles are swollen some   Past Medical History, Surgical History, Social History, Family History, Problem List, Medications, and Allergies have been reviewed and updated if relevant.  Review of Systems:  GEN: No acute illnesses, no fevers, chills. GI: No n/v/d, eating normally Pulm: No SOB Interactive and getting along well at home.  Otherwise, ROS is as per the HPI.   Physical Examination: Filed Vitals:   03/07/12 0745  BP: 120/70  Pulse: 73  Temp: 98.6 F (37 C)  TempSrc: Oral  Height: 5\' 8"  (1.727 m)  Weight: 189 lb (85.73 kg)    SpO2: 98%    Body mass index is 28.74 kg/(m^2).   GEN: WDWN, NAD, Non-toxic, Alert & Oriented x 3 HEENT: Atraumatic, Normocephalic.  Ears and Nose: No external deformity. CV: rrr, no m/g/r EXTR: No clubbing/cyanosis/edema NEURO: Normal gait.  PSYCH: Normally interactive. Conversant. Not depressed or anxious appearing.  Calm demeanor.   Foot exam Echymosis: no Edema: no ROM: full LE B Gait: heel toe, non-antalgic MT pain: no Callus pattern: none Lateral Mall: NT Medial Mall: NT Talus: NT Navicular: NT Calcaneous: NT Metatarsals: NT 5th MT: NT Phalanges: NT Achilles: NT Plantar Fascia: tender, medial along PF. Pain with forced dorsi Fat Pad: NT Peroneals: NT Post Tib: NT Great Toe: Nml motion Ant Drawer: neg Other foot breakdown: none Long arch: preserved Transverse arch: breakdown with bunionette formation on L and mild tenderness Hindfoot breakdown: none Sensation: intact   L knee full rom, minimally tender. 0-135. Stable acl, pcl, mcl, lcl  Assessment and Plan:  1. HYPERLIPIDEMIA  LDL cholesterol, direct  2. Other and unspecified hyperlipidemia  atorvastatin (LIPITOR) 20 MG tablet  3. Edema    4. Encounter for long-term (current) use of other medications  Basic metabolic panel, CBC with Differential, Hepatic function panel  5. Plantar fascia syndrome    6. Knee pain     Reviewed pf rehab Reassured about knee  Refilled meds  Check labs  Orders Today: Orders Placed This Encounter  Procedures  . LDL cholesterol, direct  . Basic metabolic panel  . CBC with Differential  . Hepatic function panel    Medications Today: Meds ordered this encounter  Medications  . naproxen sodium (ANAPROX) 220 MG tablet    Sig: Take 220 mg by mouth 2 (two) times daily with a meal.  . aspirin 81 MG tablet    Sig: Take 81 mg by mouth daily.  Marland Kitchen glucosamine-chondroitin 500-400 MG tablet    Sig: Take 1 tablet by mouth 3 (three) times daily.  Marland Kitchen DISCONTD: atorvastatin  (LIPITOR) 20 MG tablet    Sig: Take 1 tablet (20 mg total) by mouth daily.    Dispense:  30 tablet    Refill:  11  . atorvastatin (LIPITOR) 20 MG tablet    Sig: Take 1 tablet (20 mg total) by mouth daily.    Dispense:  30 tablet    Refill:  11

## 2012-03-07 NOTE — Patient Instructions (Signed)
  STRETCHING and Strengthening program critically important.  Strengthening on foot and calf muscles as seen in handout. Calf raises, 2 legged, then 1 legged. Foot massage with tennis ball. Ice massage.  Towel Scrunches: get a towel or hand towel, use toes to pick up and scrunch up the towel.  Marble pick-ups, practice picking up marbles with toes and placing into a cup  NEEDS TO BE DONE EVERY DAY  Recommended over the counter insoles. (Spenco or Hapad)  A rigid shoe with good arch support helps: Dansko (great), Keen, Merrell No easily bendable shoes.   Tuli's heel cups   

## 2012-03-08 ENCOUNTER — Encounter: Payer: Self-pay | Admitting: *Deleted

## 2012-03-08 LAB — BASIC METABOLIC PANEL
BUN: 19 mg/dL (ref 6–23)
Calcium: 9.1 mg/dL (ref 8.4–10.5)
GFR: 67.71 mL/min (ref >60.00)
Glucose, Bld: 86 mg/dL (ref 70–99)
Sodium: 144 mEq/L (ref 135–145)

## 2012-03-08 LAB — CBC WITH DIFFERENTIAL/PLATELET
Basophils Absolute: 0 10*3/uL (ref 0.0–0.1)
Lymphocytes Relative: 25.6 % (ref 12.0–46.0)
Monocytes Relative: 8.8 % (ref 3.0–12.0)
Platelets: 171 10*3/uL (ref 150.0–400.0)
RDW: 13.7 % (ref 11.5–14.6)

## 2012-03-08 LAB — HEPATIC FUNCTION PANEL
AST: 20 U/L (ref 0–37)
Alkaline Phosphatase: 72 U/L (ref 39–117)
Total Bilirubin: 0.7 mg/dL (ref 0.3–1.2)

## 2012-10-04 ENCOUNTER — Telehealth: Payer: Self-pay

## 2012-10-04 NOTE — Telephone Encounter (Signed)
Pt left note requesting rx for motion sickness patch sent to [facility] pharmacy. Pt leaving 10/05/12 for 6 day cruise.Please advise.Pt request call back when med sent in.

## 2012-10-04 NOTE — Telephone Encounter (Signed)
Transdermal Scopolamine Patch Apply 1 patch q 72 hours prn motion sickness Disp: #4, 1 refills  

## 2012-10-04 NOTE — Telephone Encounter (Signed)
rx called to pharmacy 

## 2012-10-04 NOTE — Telephone Encounter (Signed)
Pt called to verify patches had been called in to [facility], Casie said rx ready for pick up. Pt notified while on phone.

## 2013-03-22 ENCOUNTER — Other Ambulatory Visit: Payer: Self-pay

## 2013-03-22 DIAGNOSIS — E785 Hyperlipidemia, unspecified: Secondary | ICD-10-CM

## 2013-03-22 MED ORDER — ATORVASTATIN CALCIUM 20 MG PO TABS: 20.0000 mg | ORAL_TABLET | Freq: Every day | ORAL | Status: DC

## 2013-03-22 NOTE — Telephone Encounter (Signed)
Pt request refill atorvastatin to [facility]; pt has already scheduled appt with Dr Patsy Lager on 04/03/13. Advised pt done.

## 2013-04-03 ENCOUNTER — Ambulatory Visit (INDEPENDENT_AMBULATORY_CARE_PROVIDER_SITE_OTHER): Payer: 59 | Admitting: Family Medicine

## 2013-04-03 ENCOUNTER — Encounter: Payer: Self-pay | Admitting: Family Medicine

## 2013-04-03 VITALS — BP 120/70 | HR 68 | Temp 98.1°F | Ht 68.0 in | Wt 190.8 lb

## 2013-04-03 DIAGNOSIS — E785 Hyperlipidemia, unspecified: Secondary | ICD-10-CM

## 2013-04-03 MED ORDER — ATORVASTATIN CALCIUM 20 MG PO TABS: 20.0000 mg | ORAL_TABLET | Freq: Every day | ORAL | Status: DC

## 2013-04-03 NOTE — Progress Notes (Signed)
Nature conservation officer at [facility] [address] [place] [address] Phone: [phone] Fax: [phone]  Date:  04/03/2013   Name:  Angela Moses   DOB:  1956-07-27   MRN:  [id number] Gender: female Age: 57 y.o.  Primary Physician:  Hannah Beat, MD  Evaluating MD: Hannah Beat, MD   Chief Complaint: Follow-up and Medication Refill   History of Present Illness:  Angela Moses is a 57 y.o. pleasant patient who presents with the following:  Follow-up:  Has been having some R heel pain. Small achilles tears, ? PF tears.   Lipids: Doing well, stable. Tolerating meds fine with no SE. Panel reviewed with patient.  Lipids:    Component Value Date/Time   CHOL 152 12/21/2010 1107   TRIG 105.0 12/21/2010 1107   HDL 50.10 12/21/2010 1107   LDLDIRECT 90.6 03/08/2012 0735   VLDL 21.0 12/21/2010 1107   CHOLHDL 3 12/21/2010 1107    Lab Results  Component Value Date   ALT 22 03/08/2012   AST 20 03/08/2012   ALKPHOS 72 03/08/2012   BILITOT 0.7 03/08/2012   Recently labs checked at GYN  Patient Active Problem List   Diagnosis Date Noted  . KNEE PAIN, RIGHT 12/27/2010  . CERVICALGIA 12/27/2010  . VERTIGO 12/27/2010  . HYPERLIPIDEMIA 06/24/2009  . OTHER MALAISE AND FATIGUE 06/18/2009    Past Medical History  Diagnosis Date  . Plantar fasciitis   . HLD (hyperlipidemia)     Past Surgical History  Procedure Laterality Date  . Tubes in ears    . Tympanic membrane repair    . Tonsillectomy    . Wisdom tooth extraction      History   Social History  . Marital Status: Married    Spouse Name: N/A    Number of Children: N/A  . Years of Education: N/A   Occupational History  . Not on file.   Social History Main Topics  . Smoking status: Never Smoker   . Smokeless tobacco: Not on file  . Alcohol Use: Yes  . Drug Use: No  . Sexually Active: Not on file   Other Topics Concern  . Not on file   Social History Narrative  . No narrative on file     Family History  Problem Relation Age of Onset  . Parkinsonism Father   . Macular degeneration Father   . Dementia Mother   . Diabetes Mother   . Breast cancer Maternal Grandmother     cause of death  . Breast cancer Paternal Grandmother     cause of death  . Heart attack Maternal Grandfather     cause of death  . Heart attack Paternal Grandfather     cause of death    No Known Allergies  Medication list has been reviewed and updated.  Outpatient Prescriptions Prior to Visit  Medication Sig Dispense Refill  . aspirin 81 MG tablet Take 81 mg by mouth daily.      Marland Kitchen atorvastatin (LIPITOR) 20 MG tablet Take 1 tablet (20 mg total) by mouth daily.  30 tablet  0  . calcium gluconate 500 MG tablet Take 500 mg by mouth daily.        Marland Kitchen glucosamine-chondroitin 500-400 MG tablet Take 1 tablet by mouth 3 (three) times daily.      . Multiple Vitamins-Minerals (WOMENS MULTI PO) Take 1 tablet by mouth daily.        . naproxen sodium (ANAPROX) 220 MG tablet Take  220 mg by mouth 2 (two) times daily with a meal.       No facility-administered medications prior to visit.    Review of Systems:   GEN: No acute illnesses, no fevers, chills. GI: No n/v/d, eating normally Pulm: No SOB Interactive and getting along well at home.  Otherwise, ROS is as per the HPI.   Physical Examination: BP 120/70  Pulse 68  Temp(Src) 98.1 F (36.7 C) (Oral)  Ht 5\' 8"  (1.727 m)  Wt 190 lb 12 oz (86.524 kg)  BMI 29.01 kg/m2  SpO2 97%  Ideal Body Weight: Weight in (lb) to have BMI = 25: 164.1   GEN: WDWN, NAD, Non-toxic, A & O x 3 HEENT: Atraumatic, Normocephalic. Neck supple. No masses, No LAD. Ears and Nose: No external deformity. CV: RRR, No M/G/R. No JVD. No thrill. No extra heart sounds. PULM: CTA B, no wheezes, crackles, rhonchi. No retractions. No resp. distress. No accessory muscle use. EXTR: No c/c/e NEURO Normal gait.  PSYCH: Normally interactive. Conversant. Not depressed or anxious  appearing.  Calm demeanor.    Assessment and Plan:  Other and unspecified hyperlipidemia - Plan: atorvastatin (LIPITOR) 20 MG tablet  Generally doing well.   Orders Today:  No orders of the defined types were placed in this encounter.    Updated Medication List: (Includes new medications, updates to list, dose adjustments) Meds ordered this encounter  Medications  . atorvastatin (LIPITOR) 20 MG tablet    Sig: Take 1 tablet (20 mg total) by mouth daily.    Dispense:  30 tablet    Refill:  11    Medications Discontinued: Medications Discontinued During This Encounter  Medication Reason  . naproxen sodium (ANAPROX) 220 MG tablet Error  . atorvastatin (LIPITOR) 20 MG tablet Reorder      Signed, Karleen Hampshire T. Johnta Couts, MD 04/03/2013 4:27 PM

## 2013-04-17 ENCOUNTER — Encounter: Payer: Self-pay | Admitting: Family Medicine

## 2013-08-02 ENCOUNTER — Encounter (HOSPITAL_BASED_OUTPATIENT_CLINIC_OR_DEPARTMENT_OTHER): Payer: Self-pay | Admitting: *Deleted

## 2013-08-02 NOTE — Progress Notes (Signed)
No labs needed Pt gave blood yesterday-told to hydrate well the next week

## 2013-08-07 ENCOUNTER — Other Ambulatory Visit: Payer: Self-pay | Admitting: Orthopedic Surgery

## 2013-08-08 ENCOUNTER — Encounter (HOSPITAL_BASED_OUTPATIENT_CLINIC_OR_DEPARTMENT_OTHER): Payer: 59 | Admitting: Certified Registered Nurse Anesthetist

## 2013-08-08 ENCOUNTER — Ambulatory Visit (HOSPITAL_BASED_OUTPATIENT_CLINIC_OR_DEPARTMENT_OTHER)
Admission: RE | Admit: 2013-08-08 | Discharge: 2013-08-08 | Disposition: A | Discharge status: Home / Self Care | Payer: 59 | Source: Ambulatory Visit | Attending: Orthopedic Surgery | Admitting: Orthopedic Surgery

## 2013-08-08 ENCOUNTER — Ambulatory Visit (HOSPITAL_BASED_OUTPATIENT_CLINIC_OR_DEPARTMENT_OTHER): Payer: 59 | Admitting: Certified Registered Nurse Anesthetist

## 2013-08-08 ENCOUNTER — Encounter (HOSPITAL_BASED_OUTPATIENT_CLINIC_OR_DEPARTMENT_OTHER): Payer: Self-pay | Admitting: Certified Registered Nurse Anesthetist

## 2013-08-08 ENCOUNTER — Encounter (HOSPITAL_BASED_OUTPATIENT_CLINIC_OR_DEPARTMENT_OTHER)
Admission: RE | Disposition: A | Discharge status: Home / Self Care | Payer: Self-pay | Source: Ambulatory Visit | Attending: Orthopedic Surgery

## 2013-08-08 DIAGNOSIS — Z79899 Other long term (current) drug therapy: Secondary | ICD-10-CM | POA: Insufficient documentation

## 2013-08-08 DIAGNOSIS — M7661 Achilles tendinitis, right leg: Secondary | ICD-10-CM

## 2013-08-08 DIAGNOSIS — M7660 Achilles tendinitis, unspecified leg: Secondary | ICD-10-CM | POA: Insufficient documentation

## 2013-08-08 DIAGNOSIS — E785 Hyperlipidemia, unspecified: Secondary | ICD-10-CM | POA: Insufficient documentation

## 2013-08-08 DIAGNOSIS — Z7982 Long term (current) use of aspirin: Secondary | ICD-10-CM | POA: Insufficient documentation

## 2013-08-08 DIAGNOSIS — M722 Plantar fascial fibromatosis: Secondary | ICD-10-CM | POA: Insufficient documentation

## 2013-08-08 DIAGNOSIS — M6240 Contracture of muscle, unspecified site: Secondary | ICD-10-CM | POA: Insufficient documentation

## 2013-08-08 HISTORY — PX: EXCISION HAGLUND'S DEFORMITY WITH ACHILLES TENDON REPAIR: SHX5627

## 2013-08-08 HISTORY — PX: GASTROC RECESSION EXTREMITY: SHX6262

## 2013-08-08 LAB — POCT HEMOGLOBIN-HEMACUE: Hemoglobin: 11 g/dL — ABNORMAL LOW (ref 12.0–15.0)

## 2013-08-08 SURGERY — EXCISION HAGLUND'S DEFORMITY WITH ACHILLES TENDON REPAIR
Anesthesia: Regional | Site: Leg Lower | Laterality: Right | Wound class: Clean

## 2013-08-08 MED ORDER — 0.9 % SODIUM CHLORIDE (POUR BTL) OPTIME
TOPICAL | Status: DC | PRN
  Administered 2013-08-08: 300 mL

## 2013-08-08 MED ORDER — FENTANYL CITRATE 0.05 MG/ML IJ SOLN
INTRAMUSCULAR | Status: DC | PRN
  Administered 2013-08-08: 50 ug via INTRAVENOUS

## 2013-08-08 MED ORDER — MIDAZOLAM HCL 2 MG/2ML IJ SOLN
INTRAMUSCULAR | Status: AC
  Filled 2013-08-08: qty 2

## 2013-08-08 MED ORDER — PROPOFOL 10 MG/ML IV BOLUS
INTRAVENOUS | Status: DC | PRN
  Administered 2013-08-08: 100 mg via INTRAVENOUS

## 2013-08-08 MED ORDER — MIDAZOLAM HCL 2 MG/2ML IJ SOLN
1.0000 mg | INTRAMUSCULAR | Status: DC | PRN
  Administered 2013-08-08: 2 mg via INTRAVENOUS

## 2013-08-08 MED ORDER — OXYCODONE HCL 5 MG PO TABS: 5.0000 mg | ORAL_TABLET | Freq: Once | ORAL | Status: DC | PRN

## 2013-08-08 MED ORDER — FENTANYL CITRATE 0.05 MG/ML IJ SOLN
INTRAMUSCULAR | Status: AC
  Filled 2013-08-08: qty 2

## 2013-08-08 MED ORDER — SUCCINYLCHOLINE CHLORIDE 20 MG/ML IJ SOLN
INTRAMUSCULAR | Status: DC | PRN
  Administered 2013-08-08: 50 mg via INTRAVENOUS

## 2013-08-08 MED ORDER — DEXAMETHASONE SODIUM PHOSPHATE 4 MG/ML IJ SOLN
INTRAMUSCULAR | Status: DC | PRN
  Administered 2013-08-08: 10 mg via INTRAVENOUS

## 2013-08-08 MED ORDER — ACETAMINOPHEN 500 MG PO TABS
1000.0000 mg | ORAL_TABLET | Freq: Once | ORAL | Status: AC
  Administered 2013-08-08: 1000 mg via ORAL

## 2013-08-08 MED ORDER — FENTANYL CITRATE 0.05 MG/ML IJ SOLN
50.0000 ug | INTRAMUSCULAR | Status: DC | PRN
  Administered 2013-08-08: 100 ug via INTRAVENOUS

## 2013-08-08 MED ORDER — BUPIVACAINE-EPINEPHRINE PF 0.5-1:200000 % IJ SOLN
INTRAMUSCULAR | Status: AC
  Filled 2013-08-08: qty 30

## 2013-08-08 MED ORDER — HYDROMORPHONE HCL PF 1 MG/ML IJ SOLN: 0.2500 mg | INTRAMUSCULAR | Status: DC | PRN

## 2013-08-08 MED ORDER — BACITRACIN ZINC 500 UNIT/GM EX OINT
TOPICAL_OINTMENT | CUTANEOUS | Status: DC | PRN
  Administered 2013-08-08: 1 via TOPICAL

## 2013-08-08 MED ORDER — BUPIVACAINE-EPINEPHRINE PF 0.5-1:200000 % IJ SOLN
INTRAMUSCULAR | Status: DC | PRN
  Administered 2013-08-08: 30 mL

## 2013-08-08 MED ORDER — LIDOCAINE HCL (CARDIAC) 20 MG/ML IV SOLN
INTRAVENOUS | Status: DC | PRN
  Administered 2013-08-08: 30 mg via INTRAVENOUS

## 2013-08-08 MED ORDER — ACETAMINOPHEN 500 MG PO TABS
ORAL_TABLET | ORAL | Status: AC
  Filled 2013-08-08: qty 2

## 2013-08-08 MED ORDER — PROPOFOL 10 MG/ML IV EMUL
INTRAVENOUS | Status: AC
  Filled 2013-08-08: qty 50

## 2013-08-08 MED ORDER — CEFAZOLIN SODIUM-DEXTROSE 2-3 GM-% IV SOLR
INTRAVENOUS | Status: AC
  Filled 2013-08-08: qty 50

## 2013-08-08 MED ORDER — CEFAZOLIN SODIUM-DEXTROSE 2-3 GM-% IV SOLR
2.0000 g | INTRAVENOUS | Status: AC
  Administered 2013-08-08: 2 g via INTRAVENOUS

## 2013-08-08 MED ORDER — MIDAZOLAM HCL 5 MG/5ML IJ SOLN
INTRAMUSCULAR | Status: DC | PRN
  Administered 2013-08-08: 2 mg via INTRAVENOUS

## 2013-08-08 MED ORDER — SODIUM CHLORIDE 0.9 % IV SOLN: INTRAVENOUS | Status: DC

## 2013-08-08 MED ORDER — FENTANYL CITRATE 0.05 MG/ML IJ SOLN
INTRAMUSCULAR | Status: AC
  Filled 2013-08-08: qty 6

## 2013-08-08 MED ORDER — BACITRACIN ZINC 500 UNIT/GM EX OINT
TOPICAL_OINTMENT | CUTANEOUS | Status: AC
  Filled 2013-08-08: qty 28.35

## 2013-08-08 MED ORDER — OXYCODONE HCL 5 MG/5ML PO SOLN
5.0000 mg | Freq: Once | ORAL | Status: DC | PRN
Start: 2013-08-08 — End: 2013-08-08

## 2013-08-08 MED ORDER — CHLORHEXIDINE GLUCONATE 4 % EX LIQD: 60.0000 mL | Freq: Once | CUTANEOUS | Status: DC

## 2013-08-08 MED ORDER — ONDANSETRON HCL 4 MG/2ML IJ SOLN
INTRAMUSCULAR | Status: DC | PRN
  Administered 2013-08-08: 4 mg via INTRAVENOUS

## 2013-08-08 MED ORDER — LACTATED RINGERS IV SOLN
INTRAVENOUS | Status: DC
  Administered 2013-08-08 (×2): via INTRAVENOUS

## 2013-08-08 MED ORDER — BUPIVACAINE HCL (PF) 0.5 % IJ SOLN
INTRAMUSCULAR | Status: DC | PRN
  Administered 2013-08-08: 10 mL

## 2013-08-08 MED ORDER — ROXICODONE 5 MG PO TABS: 5.0000 mg | ORAL_TABLET | ORAL | Status: DC | PRN

## 2013-08-08 SURGICAL SUPPLY — 76 items
BANDAGE ESMARK 6X9 LF (GAUZE/BANDAGES/DRESSINGS) ×2 IMPLANT
BLADE AVERAGE 25X9 (BLADE) ×3 IMPLANT
BLADE SURG 15 STRL LF DISP TIS (BLADE) ×4 IMPLANT
BLADE SURG 15 STRL SS (BLADE) ×6
BNDG CMPR 9X6 STRL LF SNTH (GAUZE/BANDAGES/DRESSINGS) ×2
BNDG COHESIVE 4X5 TAN STRL (GAUZE/BANDAGES/DRESSINGS) ×3 IMPLANT
BNDG COHESIVE 6X5 TAN STRL LF (GAUZE/BANDAGES/DRESSINGS) ×3 IMPLANT
BNDG ESMARK 6X9 LF (GAUZE/BANDAGES/DRESSINGS) ×3
CANISTER SUCT 1200ML W/VALVE (MISCELLANEOUS) ×1 IMPLANT
CHLORAPREP W/TINT 26ML (MISCELLANEOUS) ×3 IMPLANT
COVER TABLE BACK 60X90 (DRAPES) ×3 IMPLANT
CUFF TOURNIQUET SINGLE 34IN LL (TOURNIQUET CUFF) ×3 IMPLANT
DRAPE EXTREMITY T 121X128X90 (DRAPE) ×3 IMPLANT
DRAPE OEC MINIVIEW 54X84 (DRAPES) IMPLANT
DRAPE U-SHAPE 47X51 STRL (DRAPES) ×3 IMPLANT
DRSG EMULSION OIL 3X3 NADH (GAUZE/BANDAGES/DRESSINGS) ×4 IMPLANT
DRSG PAD ABDOMINAL 8X10 ST (GAUZE/BANDAGES/DRESSINGS) ×6 IMPLANT
DURA STEPPER LG (CAST SUPPLIES) IMPLANT
DURA STEPPER MED (CAST SUPPLIES) IMPLANT
ELECT REM PT RETURN 9FT ADLT (ELECTROSURGICAL) ×3
ELECTRODE REM PT RTRN 9FT ADLT (ELECTROSURGICAL) ×2 IMPLANT
GLOVE BIO SURGEON STRL SZ8 (GLOVE) ×3 IMPLANT
GLOVE BIOGEL PI IND STRL 7.0 (GLOVE) IMPLANT
GLOVE BIOGEL PI IND STRL 7.5 (GLOVE) ×2 IMPLANT
GLOVE BIOGEL PI IND STRL 8 (GLOVE) ×2 IMPLANT
GLOVE BIOGEL PI INDICATOR 7.0 (GLOVE) ×1
GLOVE BIOGEL PI INDICATOR 7.5 (GLOVE) ×1
GLOVE BIOGEL PI INDICATOR 8 (GLOVE) ×1
GLOVE ECLIPSE 6.5 STRL STRAW (GLOVE) ×1 IMPLANT
GLOVE ECLIPSE 7.0 STRL STRAW (GLOVE) ×3 IMPLANT
GLOVE EXAM NITRILE MD LF STRL (GLOVE) ×1 IMPLANT
GOWN PREVENTION PLUS XLARGE (GOWN DISPOSABLE) ×6 IMPLANT
GOWN PREVENTION PLUS XXLARGE (GOWN DISPOSABLE) ×3 IMPLANT
KIT BIO-TENODESIS 3X8 DISP (MISCELLANEOUS)
KIT INSRT BABSR STRL DISP BTN (MISCELLANEOUS) IMPLANT
NDL SAFETY ECLIPSE 18X1.5 (NEEDLE) IMPLANT
NEEDLE HYPO 18GX1.5 SHARP (NEEDLE)
NEEDLE HYPO 22GX1.5 SAFETY (NEEDLE) IMPLANT
NS IRRIG 1000ML POUR BTL (IV SOLUTION) ×3 IMPLANT
PACK ACHILLES SUTUREBRIDGE (Anchor) ×1 IMPLANT
PACK BASIN DAY SURGERY FS (CUSTOM PROCEDURE TRAY) ×3 IMPLANT
PAD CAST 4YDX4 CTTN HI CHSV (CAST SUPPLIES) ×4 IMPLANT
PADDING CAST ABS 4INX4YD NS (CAST SUPPLIES)
PADDING CAST ABS COTTON 4X4 ST (CAST SUPPLIES) IMPLANT
PADDING CAST COTTON 4X4 STRL (CAST SUPPLIES) ×6
PADDING CAST COTTON 6X4 STRL (CAST SUPPLIES) ×3 IMPLANT
PENCIL BUTTON HOLSTER BLD 10FT (ELECTRODE) ×3 IMPLANT
SANITIZER HAND PURELL 535ML FO (MISCELLANEOUS) ×3 IMPLANT
SHEET MEDIUM DRAPE 40X70 STRL (DRAPES) ×3 IMPLANT
SLEEVE SCD COMPRESS KNEE MED (MISCELLANEOUS) ×3 IMPLANT
SPLINT FAST PLASTER 5X30 (CAST SUPPLIES) ×20
SPLINT PLASTER CAST FAST 5X30 (CAST SUPPLIES) ×40 IMPLANT
SPONGE GAUZE 4X4 12PLY (GAUZE/BANDAGES/DRESSINGS) ×3 IMPLANT
SPONGE LAP 18X18 X RAY DECT (DISPOSABLE) ×3 IMPLANT
STAPLER VISISTAT 35W (STAPLE) IMPLANT
STOCKINETTE 6  STRL (DRAPES) ×1
STOCKINETTE 6 STRL (DRAPES) ×2 IMPLANT
STRIP CLOSURE SKIN 1/2X4 (GAUZE/BANDAGES/DRESSINGS) IMPLANT
SUCTION FRAZIER TIP 10 FR DISP (SUCTIONS) ×1 IMPLANT
SUT ETHIBOND 2 OS 4 DA (SUTURE) IMPLANT
SUT ETHIBOND 3-0 V-5 (SUTURE) IMPLANT
SUT ETHILON 3 0 PS 1 (SUTURE) ×3 IMPLANT
SUT FIBERWIRE #2 38 T-5 BLUE (SUTURE)
SUT MNCRL AB 3-0 PS2 18 (SUTURE) ×3 IMPLANT
SUT MNCRL AB 4-0 PS2 18 (SUTURE) IMPLANT
SUT VIC AB 0 CT1 27 (SUTURE) ×3
SUT VIC AB 0 CT1 27XBRD ANBCTR (SUTURE) ×2 IMPLANT
SUT VIC AB 2-0 SH 27 (SUTURE)
SUT VIC AB 2-0 SH 27XBRD (SUTURE) IMPLANT
SUT VICRYL 4-0 PS2 18IN ABS (SUTURE) IMPLANT
SUTURE FIBERWR #2 38 T-5 BLUE (SUTURE) IMPLANT
SYR BULB 3OZ (MISCELLANEOUS) ×3 IMPLANT
TOWEL OR 17X24 6PK STRL BLUE (TOWEL DISPOSABLE) ×3 IMPLANT
TOWEL OR NON WOVEN STRL DISP B (DISPOSABLE) ×3 IMPLANT
TUBE CONNECTING 20X1/4 (TUBING) ×1 IMPLANT
UNDERPAD 30X30 INCONTINENT (UNDERPADS AND DIAPERS) ×3 IMPLANT

## 2013-08-08 NOTE — Transfer of Care (Signed)
Immediate Anesthesia Transfer of Care Note  Patient: Angela Moses  Procedure(s) Performed: Procedure(s): RIGHT EXCISION HAGLUND'S DEFORMITY, GASTROCNEMIUS RECESSION WITH ACHILLES DEBRIDEMENT AND RECONSTRUCTION (Right)  Patient Location: PACU  Anesthesia Type:GA combined with regional for post-op pain  Level of Consciousness: awake  Airway & Oxygen Therapy: Patient Spontanous Breathing and Patient connected to face mask oxygen  Post-op Assessment: Report given to PACU RN and Post -op Vital signs reviewed and stable  Post vital signs: Reviewed and stable  Complications: No apparent anesthesia complications

## 2013-08-08 NOTE — Anesthesia Postprocedure Evaluation (Signed)
  Anesthesia Post-op Note  Patient: Angela Moses  Procedure(s) Performed: Procedure(s): RIGHT EXCISION HAGLUND'S DEFORMITY, GASTROCNEMIUS RECESSION WITH ACHILLES DEBRIDEMENT AND RECONSTRUCTION (Right)  Patient Location: PACU  Anesthesia Type:General and block  Level of Consciousness: awake and alert   Airway and Oxygen Therapy: Patient Spontanous Breathing  Post-op Pain: none  Post-op Assessment: Post-op Vital signs reviewed, Patient's Cardiovascular Status Stable and Respiratory Function Stable  Post-op Vital Signs: Reviewed  Filed Vitals:   08/08/13 0945  BP:   Pulse: 72  Temp:   Resp: 13    Complications: No apparent anesthesia complications

## 2013-08-08 NOTE — Anesthesia Preprocedure Evaluation (Addendum)
Anesthesia Evaluation  Patient identified by MRN, date of birth, ID band Patient awake    Reviewed: Allergy & Precautions, H&P , NPO status , Patient's Chart, lab work & pertinent test results  Airway Mallampati: III TM Distance: >3 FB Neck ROM: Full    Dental no notable dental hx. (+) Teeth Intact and Dental Advisory Given   Pulmonary neg pulmonary ROS,  breath sounds clear to auscultation  Pulmonary exam normal       Cardiovascular negative cardio ROS  Rhythm:Regular Rate:Normal     Neuro/Psych negative neurological ROS  negative psych ROS   GI/Hepatic negative GI ROS, Neg liver ROS,   Endo/Other  negative endocrine ROS  Renal/GU negative Renal ROS  negative genitourinary   Musculoskeletal   Abdominal   Peds  Hematology negative hematology ROS (+)   Anesthesia Other Findings   Reproductive/Obstetrics negative OB ROS                         Anesthesia Physical Anesthesia Plan  ASA: II  Anesthesia Plan: General and Regional   Post-op Pain Management:    Induction: Intravenous  Airway Management Planned: Oral ETT  Additional Equipment:   Intra-op Plan:   Post-operative Plan: Extubation in OR  Informed Consent: I have reviewed the patients History and Physical, chart, labs and discussed the procedure including the risks, benefits and alternatives for the proposed anesthesia with the patient or authorized representative who has indicated his/her understanding and acceptance.   Dental advisory given  Plan Discussed with: CRNA and Surgeon  Anesthesia Plan Comments:        Anesthesia Quick Evaluation

## 2013-08-08 NOTE — Brief Op Note (Signed)
08/08/2013  8:45 AM  PATIENT:  Angela Moses  57 y.o. female  PRE-OPERATIVE DIAGNOSIS:  RIGHT ACHILLES TENODONITIS,TIGHT HEEL CORD,HAGLUND DEFORMITY  POST-OPERATIVE DIAGNOSIS:  RIGHT ACHILLES TENODONITIS,TIGHT HEEL CORD,HAGLUND DEFORMITY  Procedure(s): RIGHT EXCISION HAGLUND'S DEFORMITY, GASTROCNEMIUS RECESSION WITH ACHILLES DEBRIDEMENT AND RECONSTRUCTION  SURGEON:  Toni Arthurs, MD  ASSISTANT:   ANESTHESIA:   General, regional  EBL:  minimal   TOURNIQUET:   Total Tourniquet Time Documented: Thigh (Right) - 51 minutes Total: Thigh (Right) - 51 minutes   COMPLICATIONS:  None apparent  DISPOSITION:  Extubated, awake and stable to recovery.  DICTATION ID:  [id number]

## 2013-08-08 NOTE — Progress Notes (Signed)
Assisted Dr. Fitzgerald with right, ultrasound guided, popliteal/saphenous block. Side rails up, monitors on throughout procedure. See vital signs in flow sheet. Tolerated Procedure well. 

## 2013-08-08 NOTE — Anesthesia Procedure Notes (Addendum)
Anesthesia Regional Block:  Popliteal block  Pre-Anesthetic Checklist: ,, timeout performed, Correct Patient, Correct Site, Correct Laterality, Correct Procedure, Correct Position, site marked, Risks and benefits discussed, pre-op evaluation, post-op pain management  Laterality: Right  Prep: Maximum Sterile Barrier Precautions used and chloraprep       Needles:  Injection technique: Single-shot  Needle Type: Echogenic Stimulator Needle      Needle Gauge: 21 and 21 G    Additional Needles:  Procedures: ultrasound guided (picture in chart) and nerve stimulator Popliteal block  Nerve Stimulator or Paresthesia:  Response: Peroneal,  Response: Tibial,   Additional Responses:   Narrative:  Start time: 08/08/2013 7:02 AM End time: 08/08/2013 7:14 AM Injection made incrementally with aspirations every 5 mL. Anesthesiologist: Sampson Goon, MD  Additional Notes: 2% Lidocaine skin wheel. Saphenous block with 10cc of 0.5% Bupivicaine plain.  Popliteal block Procedure Name: Intubation Date/Time: 08/08/2013 7:32 AM Performed by: Toni Arthurs Pre-anesthesia Checklist: Patient identified, Emergency Drugs available, Suction available and Patient being monitored Patient Re-evaluated:Patient Re-evaluated prior to inductionOxygen Delivery Method: Circle System Utilized Preoxygenation: Pre-oxygenation with 100% oxygen Intubation Type: IV induction Ventilation: Mask ventilation without difficulty Laryngoscope Size: Mac and 3 Tube type: Oral Tube size: 7.0 mm Number of attempts: 1 Airway Equipment and Method: stylet and Video-laryngoscopy Placement Confirmation: ETT inserted through vocal cords under direct vision,  positive ETCO2 and breath sounds checked- equal and bilateral Secured at: 21 cm Tube secured with: Tape Dental Injury: Teeth and Oropharynx as per pre-operative assessment  Comments: Patient with protruding upper dentition, receeding chin, and narrow palate. Gentle Dl x1  with Mac 3 unable to view cords changed to glide scope Dl x1 VC visualized easy placement 7.0 ETT

## 2013-08-08 NOTE — H&P (Signed)
Angela Moses is an 57 y.o. female.   Chief Complaint: right heel pain HPI: 57 y/o female with right achilles insertional tendonitis, haglund deformity and tight heelcord.  She has failed non operative treatment and presents now for surgery.  Past Medical History  Diagnosis Date  . Plantar fasciitis   . HLD (hyperlipidemia)     Past Surgical History  Procedure Laterality Date  . Tubes in ears      lt   . Tympanic membrane repair    . Tonsillectomy    . Wisdom tooth extraction    . Abdominal hysterectomy  1997    Family History  Problem Relation Age of Onset  . Parkinsonism Father   . Macular degeneration Father   . Dementia Mother   . Diabetes Mother   . Breast cancer Maternal Grandmother     cause of death  . Breast cancer Paternal Grandmother     cause of death  . Heart attack Maternal Grandfather     cause of death  . Heart attack Paternal Grandfather     cause of death   Social History:  reports that she has never smoked. She does not have any smokeless tobacco history on file. She reports that she drinks alcohol. She reports that she does not use illicit drugs.  Allergies: No Known Allergies  Medications Prior to Admission  Medication Sig Dispense Refill  . aspirin 81 MG tablet Take 81 mg by mouth daily.      Marland Kitchen atorvastatin (LIPITOR) 20 MG tablet Take 1 tablet (20 mg total) by mouth daily.  30 tablet  11  . glucosamine-chondroitin 500-400 MG tablet Take 1 tablet by mouth 3 (three) times daily.      . Multiple Vitamins-Minerals (WOMENS MULTI PO) Take 1 tablet by mouth daily.          Results for orders placed during the hospital encounter of 08-16-2013 (from the past 48 hour(s))  POCT HEMOGLOBIN-HEMACUE     Status: Abnormal   Collection Time    08/16/2013  7:07 AM      Result Value Range   Hemoglobin 11.0 (*) 12.0 - 15.0 g/dL   No results found.  ROS  No recent f/c/n/v/wt loss  Blood pressure 99/56, pulse 70, temperature 97.9 F (36.6 C), temperature  source Oral, resp. rate 14, height 5\' 8"  (1.727 m), weight 87.658 kg (193 lb 4 oz), SpO2 100.00%. Physical Exam  wn wd female in nad.  A and O x 4.  Mood and affect normal.  EOMI.  resp unlabored.  R heel TTP at achilles insertion.  Sens to LT intact.  5/5 strength in PF and DF.  Tight heelcord.  2+ dp and pt pulses.  No lympadenopathy.  Assessment/Plan Right achilles tendonitis, Haglund deformity and tight heelcord.  To OR for gastroc recession, haglund excision and achilles debridement and reconstruction.  The risks and benefits of the alternative treatment options have been discussed in detail.  The patient wishes to proceed with surgery and specifically understands risks of bleeding, infection, nerve damage, blood clots, need for additional surgery, amputation and death.   Toni Arthurs August 16, 2013, 7:20 AM

## 2013-08-09 ENCOUNTER — Encounter (HOSPITAL_BASED_OUTPATIENT_CLINIC_OR_DEPARTMENT_OTHER): Payer: Self-pay | Admitting: Orthopedic Surgery

## 2013-08-09 NOTE — Op Note (Signed)
NAME:  Angela Moses, LEVEL NO.:  [id number]  MEDICAL RECORD NO.:  [id number]  LOCATION:                                 FACILITY:  PHYSICIAN:  Toni Arthurs, MD             DATE OF BIRTH:  DATE OF PROCEDURE: DATE OF DISCHARGE:                              OPERATIVE REPORT   PREOPERATIVE DIAGNOSES: 1. Right Achilles insertional tendonitis. 2. Right tight heel cord. 3. Right calcaneus Haglund deformity.  POSTOPERATIVE DIAGNOSES: 1. Right Achilles insertional tendonitis. 2. Right tight heel cord. 3. Right calcaneus Haglund deformity.  PROCEDURE: 1. Excision of right Haglund deformity. 2. Right gastrocnemius recession. 3. Right Achilles tendon debridement and reconstruction.  SURGEON:  Toni Arthurs, MD  ASSISTANT:  Lorin Picket Flowers, PA-C  ANESTHESIA:  General, regional.  ESTIMATED BLOOD LOSS:  Minimal.  TOURNIQUET TIME:  51 minutes at 250 mmHg.  COMPLICATIONS:  None apparent.  DISPOSITION:  Extubated awake and stable to recovery.  INDICATIONS FOR PROCEDURE:  The patient is a 57 year old female with history of insertional Achilles tendinopathy of her right heel.  She also has a tight heel cord and large Haglund deformity.  She has failed nonoperative treatment to date including activity modification, oral anti-inflammatories, physical therapy, heel lift, and shoe wear modifications.  She has an MRI, which shows insertional Achilles tendinopathy, Haglund deformity, and insertional enthesophyte at the posterior aspect of the calcaneus.  She presents now for operative treatment of this painful disabling condition.  She understands the risks and benefits of the alternative treatment options and elects surgical treatment.  She specifically understands risks of bleeding, infection, nerve damage, blood clots, need for additional surgery, amputation, continued pain and death.  PROCEDURE IN DETAIL:  After preoperative consent was obtained and the correct operative  site was identified, the patient was brought to the operating room supine on a stretcher.  General anesthesia was induced. Preoperative antibiotics were administered.  Surgical time-out was taken.  The patient was then turned in the prone position on the operating table.  The right lower extremity was then prepped and draped in standard sterile fashion.  The tourniquet had been inflated prior to turning into the prone position.  A longitudinal incision was then made at the insertion of the gastrocnemius tendon onto the soleus.  Sharp dissection was carried down through the skin and subcutaneous tissue. Care was taken to protect the sural nerve and lesser saphenous vein. The gastrocnemius tendon was identified.  The plantaris tendon was identified.  It was transected under direct vision.  The gastrocnemius tendon was then transected under direct vision as well.  The wound was irrigated copiously.  Inverted simple sutures of 3-0 Monocryl was used to close the subcutaneous tissue and running 3-0 nylon was used to close the skin incision.  Attention was then turned to the posterior aspect of the heel where longitudinal incision was made.  Sharp dissection was carried down through the skin, subcutaneous tissue, and peritenon.  The Achilles tendon was split longitudinally and elevated off of its insertion on the posterior aspect of the calcaneus medially and laterally.  The tendon was debrided of all degenerated fibers sharply with a #15  blade.  The remaining fibers were all healthy in appearance.  Less than 50% of the tendon had been debrided.  The oscillating saw was then used to resect the insertional enthesophyte and the Haglund deformity in their entirety, leaving a healthy layer of medullary bone.  The Arthrex suture bridge construct was then used to repair the Achilles tendon back down to the cut surface of the bone.  The ankle could be dorsiflexed easily to neutral with no evident  gapping of the tendon at the bone interface. Wound was again irrigated copiously.  The longitudinal split in the tendon was repaired with horizontal mattress sutures of 0-Vicryl. Paratenon was repaired over the tendon with inverted simple sutures of 3- 0 Monocryl.  A 3-0 nylon running suture was used to close the skin incision.  Sterile dressings were applied followed by a well-padded short-leg splint.  Tourniquet was released at 51 minutes.  The patient was awakened from anesthesia and transported to the recovery room in stable condition.  FOLLOWUP PLAN:  The patient will be nonweightbearing on the right lower extremity.  She will follow up with me in 2 weeks for suture removal and conversion to a CAM walker boot with a heel lift.  Scott Flowers, PA-C was present and scrubbed for the duration of the case.  His assistance was essential in gaining and maintaining exposure, performing operation, closing, dressing the wounds as well as applying the splint.     Toni Arthurs, MD     JH/MEDQ  D:  08/08/2013  T:  08/09/2013  Job:  [id number]

## 2013-11-20 ENCOUNTER — Encounter: Payer: Self-pay | Admitting: Internal Medicine

## 2013-11-20 ENCOUNTER — Ambulatory Visit (INDEPENDENT_AMBULATORY_CARE_PROVIDER_SITE_OTHER): Payer: 59 | Admitting: Internal Medicine

## 2013-11-20 VITALS — BP 116/68 | HR 76 | Temp 98.1°F | Wt 192.0 lb

## 2013-11-20 DIAGNOSIS — J209 Acute bronchitis, unspecified: Secondary | ICD-10-CM

## 2013-11-20 MED ORDER — AZITHROMYCIN 250 MG PO TABS: ORAL_TABLET | ORAL | Status: DC

## 2013-11-20 NOTE — Progress Notes (Signed)
HPI  Pt presents to the clinic today with c/o cough, chest congestion and body aches. She reports this started 3 days ago. The cough is productive of thick green mucous. She denies fever, or chills. She has not taken anything OTC. She has no history of allergies or breathing problems. She has not had sick contacts.  Review of Systems      Past Medical History  Diagnosis Date  . Plantar fasciitis   . HLD (hyperlipidemia)     Family History  Problem Relation Age of Onset  . Parkinsonism Father   . Macular degeneration Father   . Dementia Mother   . Diabetes Mother   . Breast cancer Maternal Grandmother     cause of death  . Breast cancer Paternal Grandmother     cause of death  . Heart attack Maternal Grandfather     cause of death  . Heart attack Paternal Grandfather     cause of death    History   Social History  . Marital Status: Married    Spouse Name: N/A    Number of Children: N/A  . Years of Education: N/A   Occupational History  . Not on file.   Social History Main Topics  . Smoking status: Never Smoker   . Smokeless tobacco: Not on file  . Alcohol Use: Yes     Comment: occasional  . Drug Use: No  . Sexual Activity: Not on file   Other Topics Concern  . Not on file   Social History Narrative  . No narrative on file    No Known Allergies   Constitutional: Positive headache, fatigue. Denies fever or abrupt weight changes.  HEENT:  Positive sore throat. Denies eye redness, eye pain, pressure behind the eyes, facial pain, nasal congestion, ear pain, ringing in the ears, wax buildup, runny nose or bloody nose. Respiratory: Positive cough. Denies difficulty breathing or shortness of breath.  Cardiovascular: Denies chest pain, chest tightness, palpitations or swelling in the hands or feet.   No other specific complaints in a complete review of systems (except as listed in HPI above).  Objective:   BP 116/68  Pulse 76  Temp(Src) 98.1 F (36.7 C)  (Oral)  Wt 192 lb (87.091 kg)  SpO2 98% Wt Readings from Last 3 Encounters:  11/20/13 192 lb (87.091 kg)  08/08/13 193 lb 4 oz (87.658 kg)  08/08/13 193 lb 4 oz (87.658 kg)     General: Appears his stated age, well developed, well nourished in NAD. HEENT: Head: normal shape and size; Eyes: sclera white, no icterus, conjunctiva pink, PERRLA and EOMs intact; Ears: Tm's gray and intact, normal light reflex; Nose: mucosa pink and moist, septum midline; Throat/Mouth: + PND. Teeth present, mucosa erythematous and moist, no exudate noted, no lesions or ulcerations noted.  Neck: Mild cervical lymphadenopathy. Neck supple, trachea midline. No massses, lumps or thyromegaly present.  Cardiovascular: Normal rate and rhythm. S1,S2 noted.  No murmur, rubs or gallops noted. No JVD or BLE edema. No carotid bruits noted. Pulmonary/Chest: Normal effort and scattered rhonchi throughout. No respiratory distress. No wheezes, rales noted.      Assessment & Plan:   Acute Bronchitis:  Get some rest and drink plenty of water Do salt water gargles for the sore throat eRx for Azithromax x 5 days Delsym OTC for cough  RTC as needed or if symptoms persist.

## 2013-11-20 NOTE — Patient Instructions (Addendum)
Acute Bronchitis Bronchitis is inflammation of the airways that extend from the windpipe into the lungs (bronchi). The inflammation often causes mucus to develop. This leads to a cough, which is the most common symptom of bronchitis.  In acute bronchitis, the condition usually develops suddenly and goes away over time, usually in a couple weeks. Smoking, allergies, and asthma can make bronchitis worse. Repeated episodes of bronchitis may cause further lung problems.  CAUSES Acute bronchitis is most often caused by the same virus that causes a cold. The virus can spread from person to person (contagious).  SIGNS AND SYMPTOMS   Cough.   Fever.   Coughing up mucus.   Body aches.   Chest congestion.   Chills.   Shortness of breath.   Sore throat.  DIAGNOSIS  Acute bronchitis is usually diagnosed through a physical exam. Tests, such as chest X-rays, are sometimes done to rule out other conditions.  TREATMENT  Acute bronchitis usually goes away in a couple weeks. Often times, no medical treatment is necessary. Medicines are sometimes given for relief of fever or cough. Antibiotics are usually not needed but may be prescribed in certain situations. In some cases, an inhaler may be recommended to help reduce shortness of breath and control the cough. A cool mist vaporizer may also be used to help thin bronchial secretions and make it easier to clear the chest.  HOME CARE INSTRUCTIONS  Get plenty of rest.   Drink enough fluids to keep your urine clear or pale yellow (unless you have a medical condition that requires fluid restriction). Increasing fluids may help thin your secretions and will prevent dehydration.   Only take over-the-counter or prescription medicines as directed by your health care provider.   Avoid smoking and secondhand smoke. Exposure to cigarette smoke or irritating chemicals will make bronchitis worse. If you are a smoker, consider using nicotine gum or skin  patches to help control withdrawal symptoms. Quitting smoking will help your lungs heal faster.   Reduce the chances of another bout of acute bronchitis by washing your hands frequently, avoiding people with cold symptoms, and trying not to touch your hands to your mouth, nose, or eyes.   Follow up with your health care provider as directed.  SEEK MEDICAL CARE IF: Your symptoms do not improve after 1 week of treatment.  SEEK IMMEDIATE MEDICAL CARE IF:  You develop an increased fever or chills.   You have chest pain.   You have severe shortness of breath.  You have bloody sputum.   You develop dehydration.  You develop fainting.  You develop repeated vomiting.  You develop a severe headache. MAKE SURE YOU:   Understand these instructions.  Will watch your condition.  Will get help right away if you are not doing well or get worse. Document Released: 11/03/2004 Document Revised: 05/29/2013 Document Reviewed: 03/19/2013 [employer] Patient Information 2014 [employer].  

## 2013-11-20 NOTE — Progress Notes (Signed)
Pre-visit discussion using our clinic review tool. No additional management support is needed unless otherwise documented below in the visit note.  

## 2013-11-20 NOTE — Progress Notes (Signed)
HPI  Pt presents to the clinic today with c/o cold symptoms x 3 days.   Review of Systems      Past Medical History  Diagnosis Date  . Plantar fasciitis   . HLD (hyperlipidemia)     Family History  Problem Relation Age of Onset  . Parkinsonism Father   . Macular degeneration Father   . Dementia Mother   . Diabetes Mother   . Breast cancer Maternal Grandmother     cause of death  . Breast cancer Paternal Grandmother     cause of death  . Heart attack Maternal Grandfather     cause of death  . Heart attack Paternal Grandfather     cause of death    History   Social History  . Marital Status: Married    Spouse Name: N/A    Number of Children: N/A  . Years of Education: N/A   Occupational History  . Not on file.   Social History Main Topics  . Smoking status: Never Smoker   . Smokeless tobacco: Not on file  . Alcohol Use: Yes     Comment: occasional  . Drug Use: No  . Sexual Activity: Not on file   Other Topics Concern  . Not on file   Social History Narrative  . No narrative on file    No Known Allergies   Constitutional: Positive headache, fatigue and fever. Denies abrupt weight changes.  HEENT:  Positive sore throat. Denies eye redness, eye pain, pressure behind the eyes, facial pain, nasal congestion, ear pain, ringing in the ears, wax buildup, runny nose or bloody nose. Respiratory: Positive cough. Denies difficulty breathing or shortness of breath.  Cardiovascular: Denies chest pain, chest tightness, palpitations or swelling in the hands or feet.   No other specific complaints in a complete review of systems (except as listed in HPI above).  Objective:   Wt 192 lb (87.091 kg) Wt Readings from Last 3 Encounters:  11/20/13 192 lb (87.091 kg)  08/08/13 193 lb 4 oz (87.658 kg)  08/08/13 193 lb 4 oz (87.658 kg)     General: Appears his stated age, well developed, well nourished in NAD. HEENT: Head: normal shape and size; Eyes: sclera white, no  icterus, conjunctiva pink, PERRLA and EOMs intact; Ears: Tm's gray and intact, normal light reflex; Nose: mucosa pink and moist, septum midline; Throat/Mouth: + PND. Teeth present, mucosa erythematous and moist, no exudate noted, no lesions or ulcerations noted.  Neck: Mild cervical lymphadenopathy. Neck supple, trachea midline. No massses, lumps or thyromegaly present.  Cardiovascular: Normal rate and rhythm. S1,S2 noted.  No murmur, rubs or gallops noted. No JVD or BLE edema. No carotid bruits noted. Pulmonary/Chest: Normal effort and positive vesicular breath sounds. No respiratory distress. No wheezes, rales or ronchi noted.      Assessment & Plan:   Upper Respiratory Infection:  Get some rest and drink plenty of water Do salt water gargles for the sore throat eRx for Azithromax x 5 days eRx for Hycodan cough syrup  RTC as needed or if symptoms persist.

## 2013-12-13 ENCOUNTER — Encounter: Payer: Self-pay | Admitting: Family Medicine

## 2013-12-13 ENCOUNTER — Ambulatory Visit (INDEPENDENT_AMBULATORY_CARE_PROVIDER_SITE_OTHER): Payer: 59 | Admitting: Family Medicine

## 2013-12-13 VITALS — BP 100/70 | HR 75 | Temp 98.3°F | Ht 68.0 in | Wt 195.5 lb

## 2013-12-13 DIAGNOSIS — B9789 Other viral agents as the cause of diseases classified elsewhere: Principal | ICD-10-CM

## 2013-12-13 DIAGNOSIS — J069 Acute upper respiratory infection, unspecified: Secondary | ICD-10-CM | POA: Insufficient documentation

## 2013-12-13 MED ORDER — GUAIFENESIN-CODEINE 100-10 MG/5ML PO SYRP: 5.0000 mL | ORAL_SOLUTION | Freq: Every evening | ORAL | Status: DC | PRN

## 2013-12-13 NOTE — Progress Notes (Signed)
Pre visit review using our clinic review tool, if applicable. No additional management support is needed unless otherwise documented below in the visit note. 

## 2013-12-13 NOTE — Assessment & Plan Note (Signed)
Symptomatic care 

## 2013-12-13 NOTE — Patient Instructions (Addendum)
Mucinex DM Push fluids. Rest Prescription cough suppressant at night prn. Call if SOB or worsening symptoms but expect virus to last 7-14 days total.

## 2013-12-13 NOTE — Progress Notes (Signed)
   Subjective:    Patient ID: Angela Moses, female    DOB: [birth year], 58 y.o.   MRN: [id number]  Cough This is a new problem. The current episode started in the past 7 days (2 -3 days). The problem has been gradually worsening. The problem occurs constantly. The cough is non-productive (dry hacky cough). Associated symptoms include headaches and nasal congestion. Pertinent negatives include no chest pain, chills, ear congestion, ear pain, fever, myalgias, rash, rhinorrhea, sore throat, shortness of breath or wheezing. Associated symptoms comments: Tired  no sinus pressure. The symptoms are aggravated by lying down (cough keeping her up at night). Risk factors: nonsmoker. She has tried nothing for the symptoms. There is no history of asthma, bronchiectasis, bronchitis, COPD, emphysema, environmental allergies or pneumonia.    She was treated for bronchitis 3 weeks ago on 2/11. Saw r. Baity.  Treated you with zpak.  She had resolution of symptoms.    Review of Systems  Constitutional: Negative for fever and chills.  HENT: Negative for ear pain, rhinorrhea and sore throat.   Respiratory: Positive for cough. Negative for shortness of breath and wheezing.   Cardiovascular: Negative for chest pain.  Musculoskeletal: Negative for myalgias.  Skin: Negative for rash.  Allergic/Immunologic: Negative for environmental allergies.  Neurological: Positive for headaches.       Objective:   Physical Exam  Constitutional: Vital signs are normal. She appears well-developed and well-nourished. She is cooperative.  Non-toxic appearance. She does not appear ill. No distress.  HENT:  Head: Normocephalic.  Right Ear: Hearing, tympanic membrane, external ear and ear canal normal. Tympanic membrane is not erythematous, not retracted and not bulging.  Left Ear: Hearing, tympanic membrane, external ear and ear canal normal. Tympanic membrane is not erythematous, not retracted and not bulging.  Nose:  Mucosal edema and rhinorrhea present. Right sinus exhibits no maxillary sinus tenderness and no frontal sinus tenderness. Left sinus exhibits no maxillary sinus tenderness and no frontal sinus tenderness.  Mouth/Throat: Uvula is midline, oropharynx is clear and moist and mucous membranes are normal.  Eyes: Conjunctivae, EOM and lids are normal. Pupils are equal, round, and reactive to light. Lids are everted and swept, no foreign bodies found.  Neck: Trachea normal and normal range of motion. Neck supple. Carotid bruit is not present. No mass and no thyromegaly present.  Cardiovascular: Normal rate, regular rhythm, S1 normal, S2 normal, normal heart sounds, intact distal pulses and normal pulses.  Exam reveals no gallop and no friction rub.   No murmur heard. Pulmonary/Chest: Effort normal and breath sounds normal. Not tachypneic. No respiratory distress. She has no decreased breath sounds. She has no wheezes. She has no rhonchi. She has no rales.  Neurological: She is alert.  Skin: Skin is warm, dry and intact. No rash noted.  Psychiatric: Her speech is normal and behavior is normal. Judgment normal. Her mood appears not anxious. Cognition and memory are normal. She does not exhibit a depressed mood.          Assessment & Plan:

## 2014-04-22 ENCOUNTER — Other Ambulatory Visit: Payer: Self-pay | Admitting: Family Medicine

## 2014-04-25 ENCOUNTER — Telehealth: Payer: Self-pay | Admitting: Family Medicine

## 2014-04-25 NOTE — Telephone Encounter (Signed)
Patient called.  She said the office called her and told her she needed to schedule an appointment for a physical to get her medication refilled.  Dr. Lillie Fragmin first available for a physical is 07/10/14.  Patient wants to know can she be seen sooner? Please call patient.

## 2014-04-25 NOTE — Telephone Encounter (Signed)
Morey Hummingbird,   Please schedule Angela Moses in a 30 minute slot on Dr. Lillie Fragmin schedule.  Thanks, Butch Penny

## 2014-05-05 NOTE — Telephone Encounter (Signed)
Scheduled 05/07/14

## 2014-05-07 ENCOUNTER — Encounter: Payer: Self-pay | Admitting: Family Medicine

## 2014-05-07 ENCOUNTER — Ambulatory Visit (INDEPENDENT_AMBULATORY_CARE_PROVIDER_SITE_OTHER): Payer: 59 | Admitting: Family Medicine

## 2014-05-07 VITALS — BP 108/68 | HR 60 | Temp 97.9°F | Ht 68.0 in | Wt 197.2 lb

## 2014-05-07 DIAGNOSIS — Z79899 Other long term (current) drug therapy: Secondary | ICD-10-CM

## 2014-05-07 DIAGNOSIS — M25562 Pain in left knee: Secondary | ICD-10-CM

## 2014-05-07 DIAGNOSIS — M25569 Pain in unspecified knee: Secondary | ICD-10-CM

## 2014-05-07 DIAGNOSIS — E785 Hyperlipidemia, unspecified: Secondary | ICD-10-CM

## 2014-05-07 DIAGNOSIS — M25561 Pain in right knee: Secondary | ICD-10-CM

## 2014-05-07 LAB — CBC WITH DIFFERENTIAL/PLATELET
BASOS ABS: 0 10*3/uL (ref 0.0–0.1)
Basophils Relative: 0.6 % (ref 0.0–3.0)
Eosinophils Absolute: 0.4 10*3/uL (ref 0.0–0.7)
Eosinophils Relative: 5.1 % — ABNORMAL HIGH (ref 0.0–5.0)
HCT: 41.5 % (ref 36.0–46.0)
HEMOGLOBIN: 13.7 g/dL (ref 12.0–15.0)
LYMPHS PCT: 32.1 % (ref 12.0–46.0)
Lymphs Abs: 2.6 10*3/uL (ref 0.7–4.0)
MCHC: 33 g/dL (ref 30.0–36.0)
MCV: 90.3 fl (ref 78.0–100.0)
MONOS PCT: 7.2 % (ref 3.0–12.0)
Monocytes Absolute: 0.6 10*3/uL (ref 0.1–1.0)
NEUTROS ABS: 4.5 10*3/uL (ref 1.4–7.7)
Neutrophils Relative %: 55 % (ref 43.0–77.0)
Platelets: 221 10*3/uL (ref 150.0–400.0)
RBC: 4.6 Mil/uL (ref 3.87–5.11)
RDW: 13.9 % (ref 11.5–15.5)
WBC: 8.1 10*3/uL (ref 4.0–10.5)

## 2014-05-07 LAB — LIPID PANEL
Cholesterol: 237 mg/dL — ABNORMAL HIGH (ref 0–200)
HDL: 49.2 mg/dL (ref >39.00)
LDL CALC: 151 mg/dL — AB (ref 0–99)
NonHDL: 187.8
Total CHOL/HDL Ratio: 5
Triglycerides: 182 mg/dL — ABNORMAL HIGH (ref 0.0–149.0)
VLDL: 36.4 mg/dL (ref 0.0–40.0)

## 2014-05-07 LAB — HEPATIC FUNCTION PANEL
ALK PHOS: 100 U/L (ref 39–117)
ALT: 39 U/L — ABNORMAL HIGH (ref 0–35)
AST: 30 U/L (ref 0–37)
Albumin: 3.9 g/dL (ref 3.5–5.2)
Bilirubin, Direct: 0 mg/dL (ref 0.0–0.3)
Total Bilirubin: 0.6 mg/dL (ref 0.2–1.2)
Total Protein: 7 g/dL (ref 6.0–8.3)

## 2014-05-07 LAB — BASIC METABOLIC PANEL
BUN: 16 mg/dL (ref 6–23)
CALCIUM: 9.7 mg/dL (ref 8.4–10.5)
CO2: 30 mEq/L (ref 19–32)
Chloride: 106 mEq/L (ref 96–112)
Creatinine, Ser: 1 mg/dL (ref 0.4–1.2)
GFR: 61.72 mL/min (ref >60.00)
Glucose, Bld: 88 mg/dL (ref 70–99)
Potassium: 4.3 mEq/L (ref 3.5–5.1)
SODIUM: 141 meq/L (ref 135–145)

## 2014-05-07 MED ORDER — ATORVASTATIN CALCIUM 20 MG PO TABS: 20.0000 mg | ORAL_TABLET | Freq: Every day | ORAL | Status: DC

## 2014-05-07 NOTE — Progress Notes (Signed)
[place] [address] Phone: [phone] Fax: [phone]  Patient ID: Angela Moses MRN: [id number], DOB: 05-07-1956, 58 y.o. Date of Encounter: 05/07/2014  Primary Physician:  Owens Loffler, MD   Chief Complaint: Medication Refill and Knee Pain   Subjective:   History of Present Illness:  Angela Moses is a 58 y.o. very pleasant female patient who presents with the following:  Lipids: Doing well, stable. Tolerating meds fine with no SE. Panel reviewed with patient.  Lipids:    Component Value Date/Time   CHOL 152 12/21/2010 1107   TRIG 105.0 12/21/2010 1107   HDL 50.10 12/21/2010 1107   LDLDIRECT 90.6 03/08/2012 0735   VLDL 21.0 12/21/2010 1107   CHOLHDL 3 12/21/2010 1107    Lab Results  Component Value Date   ALT 22 03/08/2012   AST 20 03/08/2012   ALKPHOS 72 03/08/2012   BILITOT 0.7 03/08/2012    Labs P.  There is also been bothering her more recently, LEFT greater than RIGHT. She is low but is some puffiness in the anterior medial aspect of her RIGHT knee as well.  Past Medical History, Surgical History, Social History, Family History, Problem List, Medications, and Allergies have been reviewed and updated if relevant.  Review of Systems:  GEN: No acute illnesses, no fevers, chills. GI: No n/v/d, eating normally Pulm: No SOB Interactive and getting along well at home.  Otherwise, ROS is as per the HPI.  Objective:   Physical Examination: BP 108/68  Pulse 60  Temp(Src) 97.9 F (36.6 C) (Oral)  Ht 5\' 8"  (1.727 m)  Wt 197 lb 4 oz (89.472 kg)  BMI 30.00 kg/m2   GEN: WDWN, NAD, Non-toxic, A & O x 3 HEENT: Atraumatic, Normocephalic. Neck supple. No masses, No LAD. Ears and Nose: No external deformity. CV: RRR, No M/G/R. No JVD. No thrill. No extra heart sounds. PULM: CTA B, no wheezes, crackles, rhonchi. No retractions. No resp. distress. No accessory muscle use. EXTR: No c/c/e NEURO Normal gait.  PSYCH: Normally interactive.  Conversant. Not depressed or anxious appearing.  Calm demeanor.   Knee:  B Gait: Normal heel toe pattern ROM: +2 - 125 on R, +2 - 120 on L Effusion: neg Echymosis or edema: none Patellar tendon NT Painful PLICA: neg Patellar grind: negative Medial and lateral patellar facet loading: negative medial and lateral joint lines:NT Mcmurray's neg Flexion-pinch neg Varus and valgus stress: stable Lachman: neg Ant and Post drawer: neg Hip abduction, IR, ER: WNL Hip flexion str: 5/5 Hip abd: 5/5 Quad: 5/5 VMO atrophy:No Hamstring concentric and eccentric: 5/5   Laboratory and Imaging Data:  Assessment & Plan:   Other and unspecified hyperlipidemia - Plan: Lipid panel  Encounter for long-term (current) use of other medications - Plan: Basic metabolic panel, CBC with Differential, Hepatic function panel  Knee pain, bilateral  Reassured about knees, by exam mild OA.  Cont activity and str  New Prescriptions   No medications on file   Modified Medications   Modified Medication Previous Medication   ATORVASTATIN (LIPITOR) 20 MG TABLET atorvastatin (LIPITOR) 20 MG tablet      Take 1 tablet (20 mg total) by mouth daily.    TAKE 1 TABLET BY MOUTH DAILY   Orders Placed This Encounter  Procedures  . Basic metabolic panel  . CBC with Differential  . Hepatic function panel  . Lipid panel   Follow-up: No Follow-up on file. Unless noted above, the patient is to follow-up  if symptoms worsen. Red flags were reviewed with the patient.  Signed,  Maud Deed. Keivon Garden, MD, [facility]   Discontinued Medications   GUAIFENESIN-CODEINE (ROBITUSSIN AC) 100-10 MG/5ML SYRUP    Take 5-10 mLs by mouth at bedtime as needed for cough.   MULTIPLE VITAMIN (MULTIVITAMIN) TABLET    Take 1 tablet by mouth daily.   Current Medications at Discharge:   Medication List       This list is accurate as of: 05/07/14  4:38 PM.  Always use your most recent med list.               aspirin 81  MG tablet  Take 81 mg by mouth daily.     atorvastatin 20 MG tablet  Commonly known as:  LIPITOR  Take 1 tablet (20 mg total) by mouth daily.     glucosamine-chondroitin 500-400 MG tablet  Take 1 tablet by mouth 3 (three) times daily.     WOMENS MULTI PO  Take 1 tablet by mouth daily.

## 2014-05-07 NOTE — Progress Notes (Signed)
Pre visit review using our clinic review tool, if applicable. No additional management support is needed unless otherwise documented below in the visit note. 

## 2014-05-08 ENCOUNTER — Encounter: Payer: Self-pay | Admitting: *Deleted

## 2014-05-21 ENCOUNTER — Encounter: Payer: Self-pay | Admitting: Family Medicine

## 2015-03-11 ENCOUNTER — Ambulatory Visit (INDEPENDENT_AMBULATORY_CARE_PROVIDER_SITE_OTHER): Payer: 59 | Admitting: Family Medicine

## 2015-03-11 ENCOUNTER — Encounter: Payer: Self-pay | Admitting: Internal Medicine

## 2015-03-11 ENCOUNTER — Encounter: Payer: Self-pay | Admitting: Family Medicine

## 2015-03-11 VITALS — BP 100/60 | HR 61 | Temp 98.6°F | Ht 68.0 in | Wt 201.0 lb

## 2015-03-11 DIAGNOSIS — M1990 Unspecified osteoarthritis, unspecified site: Secondary | ICD-10-CM

## 2015-03-11 DIAGNOSIS — M1712 Unilateral primary osteoarthritis, left knee: Secondary | ICD-10-CM

## 2015-03-11 DIAGNOSIS — M1711 Unilateral primary osteoarthritis, right knee: Secondary | ICD-10-CM | POA: Diagnosis not present

## 2015-03-11 DIAGNOSIS — L409 Psoriasis, unspecified: Secondary | ICD-10-CM

## 2015-03-11 HISTORY — DX: Psoriasis, unspecified: L40.9

## 2015-03-11 NOTE — Progress Notes (Signed)
Pre visit review using our clinic review tool, if applicable. No additional management support is needed unless otherwise documented below in the visit note. 

## 2015-03-11 NOTE — Progress Notes (Signed)
Dr. Frederico Hamman T. Trevaun Rendleman, MD, [facility] [place], [address] Phone: [phone] Fax: [phone]  03/11/2015  Patient: Angela Moses, MRN: [id number], DOB: Jul 20, 1956, 59 y.o.  Primary Physician:  Owens Loffler, MD  Chief Complaint: Knee Pain  Subjective:   Angela Moses is a 59 y.o. very pleasant female patient who presents with the following:  Both knees are hurting. Saw Dr. Shellia Carwin a few years ago, and drew fluid. And he had an MRI of her L knee. Swelling went away.   For the past couple of years. Not all that bad. Alleve will help some. L knee is a little bit worse compared to the R. Today it is not all that bad, but it will intermittently bother her. Occ will have some swelling, more on the R. No buckling or giving way.   No / minimal pain in the morning. No hand synovitis.  She does have a history of psoriasis and wonders about possibility of psoriatic arthritis  Psoriasis + No RA  Past Medical History, Surgical History, Social History, Family History, Problem List, Medications, and Allergies have been reviewed and updated if relevant.  GEN: No fevers, chills. Nontoxic. Primarily MSK c/o today. MSK: Detailed in the HPI GI: tolerating PO intake without difficulty Neuro: No numbness, parasthesias, or tingling associated. Otherwise the pertinent positives of the ROS are noted above.   Objective:   BP 100/60 mmHg  Pulse 61  Temp(Src) 98.6 F (37 C) (Oral)  Ht _0  (1.727 m)  Wt 201 lb (91.173 kg)  BMI 30.57 kg/m2   GEN: WDWN, NAD, Non-toxic, Alert & Oriented x 3 HEENT: Atraumatic, Normocephalic.  Ears and Nose: No external deformity. EXTR: No clubbing/cyanosis/edema NEURO: Normal gait.  PSYCH: Normally interactive. Conversant. Not depressed or anxious appearing.  Calm demeanor.   Knee:  B Gait: Normal heel toe pattern ROM: lack 2 deg extension in both knees and flexion to 115 Effusion:  neg Echymosis or edema: none Patellar tendon NT Painful PLICA: neg Patellar grind: negative Medial and lateral patellar facet loading: negative medial and lateral joint lines: mild medial joint line pain Mcmurray's neg Flexion-pinch some pain Varus and valgus stress: stable Lachman: neg Ant and Post drawer: neg Hip abduction, IR, ER: WNL Hip flexion str: 5/5 Hip abd: 5/5 Quad: 5/5 VMO atrophy:No Hamstring concentric and eccentric: 5/5   Radiology: Results for orders placed or performed in visit on 03/11/15  ANA  Result Value Ref Range   Anit Nuclear Antibody(ANA) NEG NEGATIVE  Cyclic citrul peptide antibody, IgG  Result Value Ref Range   Cyclic Citrullin Peptide Ab  0.0 - 5.0 U/mL  Rheumatoid factor  Result Value Ref Range   Rhuematoid fact SerPl-aCnc <10 <=14 IU/mL  High sensitivity CRP  Result Value Ref Range   CRP, High Sensitivity 2.360 0.000 - 5.000 mg/L  Sedimentation rate  Result Value Ref Range   Sed Rate 17 0 - 22 mm/hr     Assessment and Plan:   Primary osteoarthritis of left knee  Psoriasis - Plan: ANA, Cyclic citrul peptide antibody, IgG, Rheumatoid factor, High sensitivity CRP, Sedimentation rate  Arthritis - Plan: ANA, Cyclic citrul peptide antibody, IgG, Rheumatoid factor, High sensitivity CRP, Sedimentation rate  Primary osteoarthritis of right knee  We discussed treatment strategies including: Tylenol on a routine bases, 2 tablets up to 3-4 times a day For other times, NSAIDS ok  During an acute flare, intraarticular corticosteroids can be helpful.  Hyaluronic Acid injections also have had good success in treating grade I - III OA, not grade IV  Modified impact physical activity can often help   Weight loss and activity increase  With normal CRP, ESR, clinical scenario, she should not have Psoriatic arthritis  Follow-up: CPX in the fall  New Prescriptions   No medications on file   Orders Placed This Encounter  Procedures  . ANA  .  Cyclic citrul peptide antibody, IgG  . Rheumatoid factor  . High sensitivity CRP  . Sedimentation rate    Signed,  Frankie Scipio T. Angela Platner, MD   Patient's Medications  New Prescriptions   No medications on file  Previous Medications   ATORVASTATIN (LIPITOR) 20 MG TABLET    Take 1 tablet (20 mg total) by mouth daily.   GLUCOSAMINE-CHONDROITIN 500-400 MG TABLET    Take 1 tablet by mouth 3 (three) times daily.   MULTIPLE VITAMINS-MINERALS (WOMENS MULTI PO)    Take 1 tablet by mouth daily.    Modified Medications   No medications on file  Discontinued Medications   ASPIRIN 81 MG TABLET    Take 81 mg by mouth daily.

## 2015-03-12 LAB — HIGH SENSITIVITY CRP: CRP, High Sensitivity: 2.36 mg/L (ref 0.000–5.000)

## 2015-03-12 LAB — SEDIMENTATION RATE: Sed Rate: 17 mm/hr (ref 0–22)

## 2015-03-12 LAB — ANA: Anti Nuclear Antibody(ANA): NEGATIVE

## 2015-03-12 LAB — RHEUMATOID FACTOR: Rhuematoid fact SerPl-aCnc: <10 IU/mL (ref <=14)

## 2015-03-13 ENCOUNTER — Encounter: Payer: Self-pay | Admitting: *Deleted

## 2015-03-13 LAB — CYCLIC CITRUL PEPTIDE ANTIBODY, IGG

## 2015-03-23 ENCOUNTER — Telehealth: Payer: Self-pay | Admitting: Family Medicine

## 2015-03-23 NOTE — Telephone Encounter (Signed)
Pt is still  having knee pain and is requesting an xray.  Can she come on for an xray and then make appt with you? Best call back [phone].

## 2015-03-23 NOTE — Telephone Encounter (Signed)
Katerin notified as instructed by telephone.  Appointment scheduled with Dr. Lorelei Pont 03/25/2015 at 3:45 pm.

## 2015-03-23 NOTE — Telephone Encounter (Signed)
F/u and we can get x-rays and alter her plan of care.

## 2015-03-25 ENCOUNTER — Ambulatory Visit (INDEPENDENT_AMBULATORY_CARE_PROVIDER_SITE_OTHER)
Admission: RE | Admit: 2015-03-25 | Discharge: 2015-03-25 | Disposition: A | Discharge status: Home / Self Care | Payer: 59 | Source: Ambulatory Visit | Attending: Family Medicine | Admitting: Family Medicine

## 2015-03-25 ENCOUNTER — Ambulatory Visit
Admission: RE | Admit: 2015-03-25 | Discharge: 2015-03-25 | Disposition: A | Discharge status: Home / Self Care | Payer: 59 | Source: Ambulatory Visit | Attending: Family Medicine | Admitting: Family Medicine

## 2015-03-25 ENCOUNTER — Ambulatory Visit (INDEPENDENT_AMBULATORY_CARE_PROVIDER_SITE_OTHER): Payer: 59 | Admitting: Family Medicine

## 2015-03-25 ENCOUNTER — Encounter: Payer: Self-pay | Admitting: Family Medicine

## 2015-03-25 VITALS — BP 100/64 | HR 75 | Temp 97.5°F | Ht 68.0 in

## 2015-03-25 DIAGNOSIS — M170 Bilateral primary osteoarthritis of knee: Secondary | ICD-10-CM | POA: Diagnosis not present

## 2015-03-25 DIAGNOSIS — M25562 Pain in left knee: Secondary | ICD-10-CM

## 2015-03-25 DIAGNOSIS — M25561 Pain in right knee: Secondary | ICD-10-CM

## 2015-03-25 MED ORDER — DICLOFENAC SODIUM 75 MG PO TBEC: 75.0000 mg | DELAYED_RELEASE_TABLET | Freq: Two times a day (BID) | ORAL | Status: DC

## 2015-03-25 MED ORDER — METHYLPREDNISOLONE ACETATE 40 MG/ML IJ SUSP
80.0000 mg | Freq: Once | INTRAMUSCULAR | Status: AC
  Administered 2015-03-25: 80 mg via INTRA_ARTICULAR

## 2015-03-25 NOTE — Progress Notes (Signed)
Pre visit review using our clinic review tool, if applicable. No additional management support is needed unless otherwise documented below in the visit note. 

## 2015-03-26 DIAGNOSIS — M170 Bilateral primary osteoarthritis of knee: Secondary | ICD-10-CM | POA: Insufficient documentation

## 2015-03-26 NOTE — Progress Notes (Signed)
Dr. Frederico Hamman T. Valita Righter, MD, [facility] [place], [address] Phone: [phone] Fax: [phone]  03/25/2015  Patient: Angela Moses, MRN: [id number], DOB: 08-Jan-1956, 59 y.o.  Primary Physician:  Owens Loffler, MD  Chief Complaint: Follow-up  Subjective:   Angela Moses is a 59 y.o. very pleasant female patient who presents with the following:  Well-known patient here in follow-up about her knees.  Her left knee is been hurting her quite a bit in the last 7-10 days after I saw her.  She went to the beach last week, and now she is having pain with movement, standing up, and flexion.  She is having pain with deep flexion or with squatting down.  03/11/2015 Last OV with Owens Loffler, MD  Both knees are hurting. Saw Dr. Shellia Carwin a few years ago, and drew fluid. And he had an MRI of her L knee. Swelling went away.   For the past couple of years. Not all that bad. Alleve will help some. L knee is a little bit worse compared to the R. Today it is not all that bad, but it will intermittently bother her. Occ will have some swelling, more on the R. No buckling or giving way.   No / minimal pain in the morning. No hand synovitis.  She does have a history of psoriasis and wonders about possibility of psoriatic arthritis  Psoriasis + No RA  Past Medical History, Surgical History, Social History, Family History, Problem List, Medications, and Allergies have been reviewed and updated if relevant.  GEN: No fevers, chills. Nontoxic. Primarily MSK c/o today. MSK: Detailed in the HPI GI: tolerating PO intake without difficulty Neuro: No numbness, parasthesias, or tingling associated. Otherwise the pertinent positives of the ROS are noted above.   Objective:   BP 100/64 mmHg  Pulse 75  Temp(Src) 97.5 F (36.4 C) (Oral)  Ht 5\' 8"  (1.727 m)  Wt    GEN: WDWN, NAD, Non-toxic, Alert & Oriented x 3 HEENT: Atraumatic,  Normocephalic.  Ears and Nose: No external deformity. EXTR: No clubbing/cyanosis/edema NEURO: Normal gait.  PSYCH: Normally interactive. Conversant. Not depressed or anxious appearing.  Calm demeanor.   Knee:  B Gait: Normal heel toe pattern ROM: lack 2 deg extension in both knees and flexion to 115 on R, 105 on L Effusion: neg Echymosis or edema: none Patellar tendon NT Painful PLICA: neg Patellar grind: negative Medial and lateral patellar facet loading: negative medial and lateral joint lines: mild medial joint line pain Mcmurray's pain on L Flexion-pinch + on L Varus and valgus stress: stable Lachman: neg Ant and Post drawer: neg Hip abduction, IR, ER: WNL Hip flexion str: 5/5 Hip abd: 5/5 Quad: 5/5 VMO atrophy:No Hamstring concentric and eccentric: 5/5   Radiology: Results for orders placed or performed in visit on 03/11/15  ANA  Result Value Ref Range   Anit Nuclear Antibody(ANA) NEG NEGATIVE  Cyclic citrul peptide antibody, IgG  Result Value Ref Range   Cyclic Citrullin Peptide Ab <2.0 0.0 - 5.0 U/mL  Rheumatoid factor  Result Value Ref Range   Rhuematoid fact SerPl-aCnc <10 <=14 IU/mL  High sensitivity CRP  Result Value Ref Range   CRP, High Sensitivity 2.360 0.000 - 5.000 mg/L  Sedimentation rate  Result Value Ref Range   Sed Rate 17 0 - 22 mm/hr    Dg Knee Ap/lat W/sunrise Left  03/25/2015   CLINICAL DATA:  BILATERAL knee pain  EXAM: RIGHT KNEE 3 VIEWS; LEFT KNEE 3 VIEWS  COMPARISON:  None  FINDINGS: LEFT knee four views:  Osseous mineralization normal for technique.  Tricompartmental osteoarthritic changes with joint space narrowing and spur formation.  No acute fracture, dislocation or bone destruction.  No definite knee joint effusion.  RIGHT knee four views:  Osseous mineralization normal.  Tricompartmental osteoarthritic changes with joint space narrowing and spur formation.  No acute fracture, dislocation or bone destruction.  No knee joint effusion.   IMPRESSION: Tricompartmental osteoarthritic changes in both knees.   Electronically Signed   By: Lavonia Dana M.D.   On: 03/25/2015 16:57   Dg Knee Ap/lat W/sunrise Right  03/25/2015   CLINICAL DATA:  BILATERAL knee pain  EXAM: RIGHT KNEE 3 VIEWS; LEFT KNEE 3 VIEWS  COMPARISON:  None  FINDINGS: LEFT knee four views:  Osseous mineralization normal for technique.  Tricompartmental osteoarthritic changes with joint space narrowing and spur formation.  No acute fracture, dislocation or bone destruction.  No definite knee joint effusion.  RIGHT knee four views:  Osseous mineralization normal.  Tricompartmental osteoarthritic changes with joint space narrowing and spur formation.  No acute fracture, dislocation or bone destruction.  No knee joint effusion.  IMPRESSION: Tricompartmental osteoarthritic changes in both knees.   Electronically Signed   By: Lavonia Dana M.D.   On: 03/25/2015 16:57     Assessment and Plan:   Primary osteoarthritis of both knees  Bilateral knee pain - Plan: DG Knee AP/LAT W/Sunrise Right, DG Knee AP/LAT W/Sunrise Left  Left knee pain - Plan: methylPREDNISolone acetate (DEPO-MEDROL) injection 80 mg  Left knee is actively flared up today, notably worse compared to my last examination 2 weeks ago.  Radiographic changes are more significant than anticipated with at least moderate degree osteoarthritic change bilaterally tricompartmentally, with more of a moderate to severe picture on the medial compartment on the left.  This is best visualized on the 30 AP weightbearing views.  Knee Injection, LEFT Patient verbally consented to procedure. Risks (including potential rare risk of infection), benefits, and alternatives explained. Sterilely prepped with Chloraprep. Ethyl cholride used for anesthesia. 8 cc Lidocaine 1% mixed with Depo-Medrol 80 mg injected using the anteromedial approach without difficulty. No complications with procedure and tolerated well. Patient had decreased pain  post-injection.   New Prescriptions   DICLOFENAC (VOLTAREN) 75 MG EC TABLET    Take 1 tablet (75 mg total) by mouth 2 (two) times daily.   Orders Placed This Encounter  Procedures  . DG Knee AP/LAT W/Sunrise Right  . DG Knee AP/LAT W/Sunrise Left    Signed,  Cornell Gaber T. Tamsen Reist, MD   Patient's Medications  New Prescriptions   DICLOFENAC (VOLTAREN) 75 MG EC TABLET    Take 1 tablet (75 mg total) by mouth 2 (two) times daily.  Previous Medications   ATORVASTATIN (LIPITOR) 20 MG TABLET    Take 1 tablet (20 mg total) by mouth daily.   GLUCOSAMINE-CHONDROITIN 500-400 MG TABLET    Take 1 tablet by mouth 3 (three) times daily.   MULTIPLE VITAMINS-MINERALS (WOMENS MULTI PO)    Take 1 tablet by mouth daily.    Modified Medications   No medications on file  Discontinued Medications   No medications on file

## 2015-05-30 ENCOUNTER — Other Ambulatory Visit: Payer: Self-pay | Admitting: Family Medicine

## 2015-05-30 NOTE — Telephone Encounter (Signed)
Please call and schedule CPE with fasting labs prior with Dr. Copland.  

## 2015-09-10 ENCOUNTER — Telehealth: Payer: Self-pay | Admitting: Family Medicine

## 2015-09-10 NOTE — Telephone Encounter (Signed)
Please call and schedule CPE with fasting labs for Dr. Copland. 

## 2015-09-11 NOTE — Telephone Encounter (Signed)
Left message asking pt to call office  °

## 2015-09-15 NOTE — Telephone Encounter (Signed)
Labs 1/6 Cpx 1/11 Pt aware  Please close

## 2015-10-14 ENCOUNTER — Other Ambulatory Visit: Payer: Self-pay | Admitting: Family Medicine

## 2015-10-15 ENCOUNTER — Other Ambulatory Visit: Payer: Self-pay | Admitting: Family Medicine

## 2015-10-15 DIAGNOSIS — E785 Hyperlipidemia, unspecified: Secondary | ICD-10-CM

## 2015-10-15 DIAGNOSIS — R5382 Chronic fatigue, unspecified: Secondary | ICD-10-CM

## 2015-10-16 ENCOUNTER — Other Ambulatory Visit (INDEPENDENT_AMBULATORY_CARE_PROVIDER_SITE_OTHER): Payer: 59

## 2015-10-16 ENCOUNTER — Other Ambulatory Visit: Payer: 59

## 2015-10-16 DIAGNOSIS — R5382 Chronic fatigue, unspecified: Secondary | ICD-10-CM | POA: Diagnosis not present

## 2015-10-16 DIAGNOSIS — E785 Hyperlipidemia, unspecified: Secondary | ICD-10-CM | POA: Diagnosis not present

## 2015-10-16 LAB — BASIC METABOLIC PANEL
BUN: 17 mg/dL (ref 6–23)
CO2: 29 mEq/L (ref 19–32)
Calcium: 9.5 mg/dL (ref 8.4–10.5)
Chloride: 108 mEq/L (ref 96–112)
Creatinine, Ser: 0.96 mg/dL (ref 0.40–1.20)
GFR: 63.62 mL/min (ref >60.00)
GLUCOSE: 82 mg/dL (ref 70–99)
Potassium: 4 mEq/L (ref 3.5–5.1)
Sodium: 144 mEq/L (ref 135–145)

## 2015-10-16 LAB — LIPID PANEL
CHOLESTEROL: 197 mg/dL (ref 0–200)
HDL: 53.5 mg/dL (ref >39.00)
LDL Cholesterol: 115 mg/dL — ABNORMAL HIGH (ref 0–99)
NonHDL: 143.63
TRIGLYCERIDES: 141 mg/dL (ref 0.0–149.0)
Total CHOL/HDL Ratio: 4
VLDL: 28.2 mg/dL (ref 0.0–40.0)

## 2015-10-16 LAB — CBC WITH DIFFERENTIAL/PLATELET
BASOS ABS: 0 10*3/uL (ref 0.0–0.1)
Basophils Relative: 0.6 % (ref 0.0–3.0)
EOS ABS: 0.3 10*3/uL (ref 0.0–0.7)
Eosinophils Relative: 4.8 % (ref 0.0–5.0)
HEMATOCRIT: 39.5 % (ref 36.0–46.0)
Hemoglobin: 12.8 g/dL (ref 12.0–15.0)
Lymphocytes Relative: 30.5 % (ref 12.0–46.0)
Lymphs Abs: 2 10*3/uL (ref 0.7–4.0)
MCHC: 32.2 g/dL (ref 30.0–36.0)
MCV: 87.6 fl (ref 78.0–100.0)
Monocytes Absolute: 0.5 10*3/uL (ref 0.1–1.0)
Monocytes Relative: 7.6 % (ref 3.0–12.0)
Neutro Abs: 3.8 10*3/uL (ref 1.4–7.7)
Neutrophils Relative %: 56.5 % (ref 43.0–77.0)
PLATELETS: 268 10*3/uL (ref 150.0–400.0)
RBC: 4.52 Mil/uL (ref 3.87–5.11)
RDW: 14.1 % (ref 11.5–15.5)
WBC: 6.7 10*3/uL (ref 4.0–10.5)

## 2015-10-16 LAB — HEPATIC FUNCTION PANEL
ALBUMIN: 3.9 g/dL (ref 3.5–5.2)
ALT: 37 U/L — ABNORMAL HIGH (ref 0–35)
AST: 24 U/L (ref 0–37)
Alkaline Phosphatase: 107 U/L (ref 39–117)
BILIRUBIN TOTAL: 0.4 mg/dL (ref 0.2–1.2)
Bilirubin, Direct: 0 mg/dL (ref 0.0–0.3)
Total Protein: 6.6 g/dL (ref 6.0–8.3)

## 2015-10-16 LAB — TSH: TSH: 2.32 u[IU]/mL (ref 0.35–4.50)

## 2015-10-21 ENCOUNTER — Ambulatory Visit (INDEPENDENT_AMBULATORY_CARE_PROVIDER_SITE_OTHER): Payer: 59 | Admitting: Family Medicine

## 2015-10-21 ENCOUNTER — Encounter: Payer: Self-pay | Admitting: Family Medicine

## 2015-10-21 VITALS — BP 100/60 | HR 78 | Temp 97.5°F | Ht 68.0 in | Wt 200.5 lb

## 2015-10-21 DIAGNOSIS — Z0000 Encounter for general adult medical examination without abnormal findings: Secondary | ICD-10-CM | POA: Diagnosis not present

## 2015-10-21 NOTE — Progress Notes (Signed)
Pre visit review using our clinic review tool, if applicable. No additional management support is needed unless otherwise documented below in the visit note. 

## 2015-10-21 NOTE — Progress Notes (Signed)
Dr. Frederico Hamman T. Pietrina Jagodzinski, MD, [facility] [place], [address] Phone: [phone] Fax: [phone]  10/21/2015  Patient: Angela Moses, MRN: [id number], DOB: 23-Sep-1956, 60 y.o.  Primary Physician:  Owens Loffler, MD   Chief Complaint  Patient presents with  . Annual Exam   Subjective:   Angela Moses is a 60 y.o. pleasant patient who presents with the following:  Health Maintenance Summary Reviewed and updated, unless pt declines services.  Tobacco History Reviewed. Non-smoker Alcohol: No concerns, no excessive use Exercise Habits: Some activity on farm, rec at least 30 mins 5 times a week STD concerns: none Drug Use: None Birth control method: Menses regular: yes Lumps or breast concerns: no Breast Cancer Family History: no  Taking some voltaren in the morning.   Health Maintenance  Topic Date Due  . Hepatitis C Screening  1955/12/20  . HIV Screening  07/26/1973  . INFLUENZA VACCINE  05/10/2016  . MAMMOGRAM  06/01/2017  . COLONOSCOPY  09/29/2019  . TETANUS/TDAP  10/01/2020    Immunization History  Administered Date(s) Administered  . Influenza Whole 07/10/2010, 10/01/2010  . Influenza,inj,Quad PF,36+ Mos 08/17/2015  . Td 03/10/2006, 10/01/2010   Patient Active Problem List   Diagnosis Date Noted  . Primary osteoarthritis of both knees 03/26/2015  . Psoriasis 03/11/2015  . CERVICALGIA 12/27/2010  . VERTIGO 12/27/2010  . HYPERLIPIDEMIA 06/24/2009   Past Medical History  Diagnosis Date  . Plantar fasciitis   . HLD (hyperlipidemia)   . Psoriasis 03/11/2015   Past Surgical History  Procedure Laterality Date  . Tubes in ears      lt   . Tympanic membrane repair    . Tonsillectomy    . Wisdom tooth extraction    . Abdominal hysterectomy  1997  . Excision haglund's deformity with achilles tendon repair Right 08/08/2013    Procedure: RIGHT EXCISION HAGLUND'S DEFORMITY  WITH ACHILLES  DEBRIDEMENT AND RECONSTRUCTION;  Surgeon: Wylene Simmer, MD;  Location: [facility];  Service: Orthopedics;  Laterality: Right;  . Gastroc recession extremity Right 08/08/2013    Procedure: GASTROCNEMIUS RECESSION ;  Surgeon: Wylene Simmer, MD;  Location: [facility];  Service: Orthopedics;  Laterality: Right;   Social History   Social History  . Marital Status: Married    Spouse Name: N/A  . Number of Children: N/A  . Years of Education: N/A   Occupational History  . Not on file.   Social History Main Topics  . Smoking status: Never Smoker   . Smokeless tobacco: Never Used  . Alcohol Use: Yes     Comment: occasional  . Drug Use: No  . Sexual Activity: Not on file   Other Topics Concern  . Not on file   Social History Narrative   Family History  Problem Relation Age of Onset  . Parkinsonism Father   . Macular degeneration Father   . Dementia Mother   . Diabetes Mother   . Breast cancer Maternal Grandmother     cause of death  . Breast cancer Paternal Grandmother     cause of death  . Heart attack Maternal Grandfather     cause of death  . Heart attack Paternal Grandfather     cause of death   No Known Allergies  Medication list has been reviewed and updated.   General: Denies fever, chills, sweats. No significant weight loss. Eyes: Denies blurring,significant itching ENT: Denies earache, sore  throat, and hoarseness.  Cardiovascular: Denies chest pains, palpitations, dyspnea on exertion,  Respiratory: Denies cough, dyspnea at rest,wheeezing Breast: no concerns about lumps GI: Denies nausea, vomiting, diarrhea, constipation, change in bowel habits, abdominal pain, melena, hematochezia GU: Denies dysuria, hematuria, urinary hesitancy, nocturia, denies STD risk, no concerns about discharge Musculoskeletal: Denies back pain. Joint and knee stiffness Derm: Denies rash, itching Neuro: Denies  paresthesias, frequent falls, frequent  headaches Psych: Denies depression, anxiety Endocrine: Denies cold intolerance, heat intolerance, polydipsia Heme: Denies enlarged lymph nodes Allergy: No hayfever  Objective:   BP 100/60 mmHg  Pulse 78  Temp(Src) 97.5 F (36.4 C) (Oral)  Ht _0  (1.727 m)  Wt 200 lb 8 oz (90.946 kg)  BMI 30.49 kg/m2 No exam data present  GEN: well developed, well nourished, no acute distress Eyes: conjunctiva and lids normal, PERRLA, EOMI ENT: TM clear, nares clear, oral exam WNL Neck: supple, no lymphadenopathy, no thyromegaly, no JVD Pulm: clear to auscultation and percussion, respiratory effort normal CV: regular rate and rhythm, S1-S2, no murmur, rub or gallop, no bruits Chest: no scars, masses, no lumps BREAST: breast exam declined GI: soft, non-tender; no hepatosplenomegaly, masses; active bowel sounds all quadrants GU: GU exam declined Lymph: no cervical, axillary or inguinal adenopathy MSK: gait normal, muscle tone and strength WNL, no joint swelling, effusions, discoloration, crepitus  SKIN: clear, good turgor, color WNL, no rashes, lesions, or ulcerations Neuro: normal mental status, normal strength, sensation, and motion Psych: alert; oriented to person, place and time, normally interactive and not anxious or depressed in appearance.   All labs reviewed with patient. Lipids:    Component Value Date/Time   CHOL 197 10/16/2015 0843   TRIG 141.0 10/16/2015 0843   HDL 53.50 10/16/2015 0843   LDLDIRECT 90.6 03/08/2012 0735   VLDL 28.2 10/16/2015 0843   CHOLHDL 4 10/16/2015 0843   CBC: CBC Latest Ref Rng 10/16/2015 05/07/2014 08/08/2013  WBC 4.0 - 10.5 K/uL 6.7 8.1 -  Hemoglobin 12.0 - 15.0 g/dL 12.8 13.7 11.0(L)  Hematocrit 36.0 - 46.0 % 39.5 41.5 -  Platelets 150.0 - 400.0 K/uL 268.0 221.0 -    Basic Metabolic Panel:    Component Value Date/Time   NA 144 10/16/2015 0843   K 4.0 10/16/2015 0843   CL 108 10/16/2015 0843   CO2 29 10/16/2015 0843   BUN 17 10/16/2015 0843    CREATININE 0.96 10/16/2015 0843   GLUCOSE 82 10/16/2015 0843   CALCIUM 9.5 10/16/2015 0843   Hepatic Function Latest Ref Rng 10/16/2015 05/07/2014 03/08/2012  Total Protein 6.0 - 8.3 g/dL 6.6 7.0 6.3  Albumin 3.5 - 5.2 g/dL 3.9 3.9 3.7  AST 0 - 37 U/L _1 ALT 0 - 35 U/L 37(H) 39(H) 22  Alk Phosphatase 39 - 117 U/L 107 100 72  Total Bilirubin 0.2 - 1.2 mg/dL 0.4 0.6 0.7  Bilirubin, Direct 0.0 - 0.3 mg/dL 0.0 0.0 0.1    Lab Results  Component Value Date   TSH 2.32 10/16/2015   No results found.  Assessment and Plan:   No diagnosis found.  Health Maintenance Exam: The patient's preventative maintenance and recommended screening tests for an annual wellness exam were reviewed in full today. Brought up to date unless services declined.  Counselled on the importance of diet, exercise, and its role in overall health and mortality. The patient's FH and SH was reviewed, including their home life, tobacco status, and drug and alcohol status.  Follow-up: No Follow-up on file.  Or follow-up in 1 year for complete physical examination  Signed,  Frederico Hamman T. Alisyn Lequire, MD   Patient's Medications  New Prescriptions   No medications on file  Previous Medications   ATORVASTATIN (LIPITOR) 20 MG TABLET    TAKE 1 TABLET BY MOUTH ONCE A DAY *NEED OFFICE VISIT   DICLOFENAC (VOLTAREN) 75 MG EC TABLET    Take 1 tablet (75 mg total) by mouth 2 (two) times daily.   MULTIPLE VITAMINS-MINERALS (WOMENS MULTI PO)    Take 1 tablet by mouth daily.    Modified Medications   No medications on file  Discontinued Medications   GLUCOSAMINE-CHONDROITIN 500-400 MG TABLET    Take 1 tablet by mouth 3 (three) times daily.

## 2015-11-14 ENCOUNTER — Other Ambulatory Visit: Payer: Self-pay | Admitting: Family Medicine

## 2016-03-01 ENCOUNTER — Emergency Department: Payer: 59

## 2016-03-01 ENCOUNTER — Emergency Department
Admission: EM | Admit: 2016-03-01 | Discharge: 2016-03-01 | Disposition: A | Discharge status: Home / Self Care | Payer: 59 | Attending: Emergency Medicine | Admitting: Emergency Medicine

## 2016-03-01 ENCOUNTER — Telehealth: Payer: Self-pay | Admitting: Family Medicine

## 2016-03-01 ENCOUNTER — Encounter: Payer: Self-pay | Admitting: Emergency Medicine

## 2016-03-01 DIAGNOSIS — R112 Nausea with vomiting, unspecified: Secondary | ICD-10-CM | POA: Diagnosis not present

## 2016-03-01 DIAGNOSIS — R519 Headache, unspecified: Secondary | ICD-10-CM

## 2016-03-01 DIAGNOSIS — Z791 Long term (current) use of non-steroidal anti-inflammatories (NSAID): Secondary | ICD-10-CM | POA: Diagnosis not present

## 2016-03-01 DIAGNOSIS — R51 Headache: Secondary | ICD-10-CM | POA: Insufficient documentation

## 2016-03-01 DIAGNOSIS — W1800XA Striking against unspecified object with subsequent fall, initial encounter: Secondary | ICD-10-CM | POA: Diagnosis not present

## 2016-03-01 DIAGNOSIS — Y929 Unspecified place or not applicable: Secondary | ICD-10-CM | POA: Insufficient documentation

## 2016-03-01 DIAGNOSIS — S0993XA Unspecified injury of face, initial encounter: Secondary | ICD-10-CM | POA: Diagnosis present

## 2016-03-01 DIAGNOSIS — E785 Hyperlipidemia, unspecified: Secondary | ICD-10-CM | POA: Diagnosis not present

## 2016-03-01 DIAGNOSIS — Y939 Activity, unspecified: Secondary | ICD-10-CM | POA: Diagnosis not present

## 2016-03-01 DIAGNOSIS — Y999 Unspecified external cause status: Secondary | ICD-10-CM | POA: Diagnosis not present

## 2016-03-01 DIAGNOSIS — M170 Bilateral primary osteoarthritis of knee: Secondary | ICD-10-CM | POA: Insufficient documentation

## 2016-03-01 MED ORDER — ONDANSETRON HCL 4 MG PO TABS: 4.0000 mg | ORAL_TABLET | Freq: Every day | ORAL | Status: DC | PRN

## 2016-03-01 MED ORDER — BUTALBITAL-APAP-CAFFEINE 50-325-40 MG PO TABS
2.0000 | ORAL_TABLET | Freq: Once | ORAL | Status: AC
  Administered 2016-03-01: 2 via ORAL
  Filled 2016-03-01: qty 1

## 2016-03-01 MED ORDER — BUTALBITAL-APAP-CAFFEINE 50-325-40 MG PO TABS: 1.0000 | ORAL_TABLET | Freq: Four times a day (QID) | ORAL | Status: AC | PRN

## 2016-03-01 MED ORDER — BUTALBITAL-APAP-CAFFEINE 50-325-40 MG PO TABS
ORAL_TABLET | ORAL | Status: AC
  Administered 2016-03-01: 2 via ORAL
  Filled 2016-03-01: qty 1

## 2016-03-01 NOTE — Telephone Encounter (Signed)
i will see in 2 days

## 2016-03-01 NOTE — Telephone Encounter (Signed)
Pt is on her way to [facility] ED now.

## 2016-03-01 NOTE — ED Notes (Signed)
Patient presents to the ED with headache since Sunday.  Patient states she fell and hit her face on a shelf on Saturday, patient has a small laceration to her nose and a small amount of swelling under her left eye.  Patient denies loss of consciousness or blurry vision.  Patient states on Monday she tried to take diclofenac and ibuprofen for her headache and she vomited after taking the medication.  Patient reports being able to eat this morning without vomiting.  Patient is alert and oriented x 4.  No obvious distress.  Patient states headache feels somewhat improved at this time.

## 2016-03-01 NOTE — Telephone Encounter (Signed)
Nurse Assessment  Nurse: Julien Girt, RN, [facility] Free Date/Time Angela Moses Time): 03/01/2016 12:16:23 PM  Confirm and document reason for call. If symptomatic, describe symptoms. You must click the next button to save text entered. ---Caller states she fell on Saturday hitting the bridge of her nose on a shelf and falling down. Denies any head injury but noticed swelling above her right eye on Sunday night. Adds, Sunday she developed a headache and it has been persistent. She did vomit x 2 on Monday but thought it to be from taking her pain medication on an empty stomach. This morning the headache seemed better, she took Ibuprofen, tried to work but her headache is worse now and she does not feel well. 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 ---Not Applicable  Does the patient have any new or worsening symptoms? ---Yes  Will a triage be completed? ---Yes  Related visit to physician within the last 2 weeks? ---No  Does the PT have any chronic conditions? (i.e. diabetes, asthma, etc.) ---Yes  List chronic conditions. ---Arthritis knees, High cholesterol  Is this a behavioral health or substance abuse call? ---No     Guidelines    Guideline Title Affirmed Question Affirmed Notes  Head Injury Vomiting once or more    Final Disposition User   Go to ED Now Julien Girt, RN, [facility] Free    Referrals  GO TO FACILITY UNDECIDED   Disagree/Comply: Leta Baptist

## 2016-03-01 NOTE — Discharge Instructions (Signed)
Nausea and Vomiting °Nausea is a sick feeling that often comes before throwing up (vomiting). Vomiting is a reflex where stomach contents come out of your mouth. Vomiting can cause severe loss of body fluids (dehydration). Children and elderly adults can become dehydrated quickly, especially if they also have diarrhea. Nausea and vomiting are symptoms of a condition or disease. It is important to find the cause of your symptoms. °CAUSES  °· Direct irritation of the stomach lining. This irritation can result from increased acid production (gastroesophageal reflux disease), infection, food poisoning, taking certain medicines (such as nonsteroidal anti-inflammatory drugs), alcohol use, or tobacco use. °· Signals from the brain. These signals could be caused by a headache, heat exposure, an inner ear disturbance, increased pressure in the brain from injury, infection, a tumor, or a concussion, pain, emotional stimulus, or metabolic problems. °· An obstruction in the gastrointestinal tract (bowel obstruction). °· Illnesses such as diabetes, hepatitis, gallbladder problems, appendicitis, kidney problems, cancer, sepsis, atypical symptoms of a heart attack, or eating disorders. °· Medical treatments such as chemotherapy and radiation. °· Receiving medicine that makes you sleep (general anesthetic) during surgery. °DIAGNOSIS °Your caregiver may ask for tests to be done if the problems do not improve after a few days. Tests may also be done if symptoms are severe or if the reason for the nausea and vomiting is not clear. Tests may include: °· Urine tests. °· Blood tests. °· Stool tests. °· Cultures (to look for evidence of infection). °· X-rays or other imaging studies. °Test results can help your caregiver make decisions about treatment or the need for additional tests. °TREATMENT °You need to stay well hydrated. Drink frequently but in small amounts. You may wish to drink water, sports drinks, clear broth, or eat frozen  ice pops or gelatin dessert to help stay hydrated. When you eat, eating slowly may help prevent nausea. There are also some antinausea medicines that may help prevent nausea. °HOME CARE INSTRUCTIONS  °· Take all medicine as directed by your caregiver. °· If you do not have an appetite, do not force yourself to eat. However, you must continue to drink fluids. °· If you have an appetite, eat a normal diet unless your caregiver tells you differently. °¨ Eat a variety of complex carbohydrates (rice, wheat, potatoes, bread), lean meats, yogurt, fruits, and vegetables. °¨ Avoid high-fat foods because they are more difficult to digest. °· Drink enough water and fluids to keep your urine clear or pale yellow. °· If you are dehydrated, ask your caregiver for specific rehydration instructions. Signs of dehydration may include: °¨ Severe thirst. °¨ Dry lips and mouth. °¨ Dizziness. °¨ Dark urine. °¨ Decreasing urine frequency and amount. °¨ Confusion. °¨ Rapid breathing or pulse. °SEEK IMMEDIATE MEDICAL CARE IF:  °· You have blood or brown flecks (like coffee grounds) in your vomit. °· You have black or bloody stools. °· You have a severe headache or stiff neck. °· You are confused. °· You have severe abdominal pain. °· You have chest pain or trouble breathing. °· You do not urinate at least once every 8 hours. °· You develop cold or clammy skin. °· You continue to vomit for longer than 24 to 48 hours. °· You have a fever. °MAKE SURE YOU:  °· Understand these instructions. °· Will watch your condition. °· Will get help right away if you are not doing well or get worse. °  °This information is not intended to replace advice given to you by your health care provider. Make sure   you discuss any questions you have with your health care provider.   Document Released: 09/26/2005 Document Revised: 12/19/2011 Document Reviewed: 02/23/2011 Elsevier Interactive Patient Education 2016 [place] Headache Without Cause A  headache is pain or discomfort felt around the head or neck area. There are many causes and types of headaches. In some cases, the cause may not be found.  HOME CARE  Managing Pain  Take over-the-counter and prescription medicines only as told by your doctor.  Lie down in a dark, quiet room when you have a headache.  If directed, apply ice to the head and neck area:  Put ice in a plastic bag.  Place a towel between your skin and the bag.  Leave the ice on for 20 minutes, 2-3 times per day.  Use a heating pad or hot shower to apply heat to the head and neck area as told by your doctor.  Keep lights dim if bright lights bother you or make your headaches worse. Eating and Drinking  Eat meals on a regular schedule.  Lessen how much alcohol you drink.  Lessen how much caffeine you drink, or stop drinking caffeine. General Instructions  Keep all follow-up visits as told by your doctor. This is important.  Keep a journal to find out if certain things bring on headaches. For example, write down:  What you eat and drink.  How much sleep you get.  Any change to your diet or medicines.  Relax by getting a massage or doing other relaxing activities.  Lessen stress.  Sit up straight. Do not tighten (tense) your muscles.  Do not use tobacco products. This includes cigarettes, chewing tobacco, or e-cigarettes. If you need help quitting, ask your doctor.  Exercise regularly as told by your doctor.  Get enough sleep. This often means 7-9 hours of sleep. GET HELP IF:  Your symptoms are not helped by medicine.  You have a headache that feels different than the other headaches.  You feel sick to your stomach (nauseous) or you throw up (vomit).  You have a fever. GET HELP RIGHT AWAY IF:   Your headache becomes really bad.  You keep throwing up.  You have a stiff neck.  You have trouble seeing.  You have trouble speaking.  You have pain in the eye or ear.  Your  muscles are weak or you lose muscle control.  You lose your balance or have trouble walking.  You feel like you will pass out (faint) or you pass out.  You have confusion.   This information is not intended to replace advice given to you by your health care provider. Make sure you discuss any questions you have with your health care provider.   Document Released: 07/05/2008 Document Revised: 06/17/2015 Document Reviewed: 01/19/2015 Elsevier Interactive Patient Education [employer].

## 2016-03-01 NOTE — ED Provider Notes (Signed)
[facility] Emergency Department Provider Note        Time seen: ----------------------------------------- 3:37 PM on 03/01/2016 -----------------------------------------    I have reviewed the triage vital signs and the nursing notes.   HISTORY  Chief Complaint Fall and Headache    HPI Angela Moses is a 60 y.o. female who presents ER for headaches since Sunday. Patient states she fell and hit her face on a shelf on Saturday particularly injuring her nose. No abrasion was noted over her nose with small swelling on the left side. She denies loss of consciousness or blurred vision. Patient states Monday and today she's had headache and vomiting. States headache feels a little improved at this time.   Past Medical History  Diagnosis Date  . Plantar fasciitis   . HLD (hyperlipidemia)   . Psoriasis 03/11/2015    Patient Active Problem List   Diagnosis Date Noted  . Primary osteoarthritis of both knees 03/26/2015  . Psoriasis 03/11/2015  . CERVICALGIA 12/27/2010  . VERTIGO 12/27/2010  . HYPERLIPIDEMIA 06/24/2009    Past Surgical History  Procedure Laterality Date  . Tubes in ears      lt   . Tympanic membrane repair    . Tonsillectomy    . Wisdom tooth extraction    . Abdominal hysterectomy  1997  . Excision haglund's deformity with achilles tendon repair Right 08/08/2013    Procedure: RIGHT EXCISION HAGLUND'S DEFORMITY  WITH ACHILLES DEBRIDEMENT AND RECONSTRUCTION;  Surgeon: Wylene Simmer, MD;  Location: [place];  Service: Orthopedics;  Laterality: Right;  . Gastroc recession extremity Right 08/08/2013    Procedure: GASTROCNEMIUS RECESSION ;  Surgeon: Wylene Simmer, MD;  Location: [place];  Service: Orthopedics;  Laterality: Right;    Allergies Review of patient's allergies indicates no known allergies.  Social History Social History  Substance Use Topics  . Smoking status: Never Smoker   . Smokeless  tobacco: Never Used  . Alcohol Use: Yes     Comment: occasional    Review of Systems Constitutional: Negative for fever. Eyes: Negative for visual changes. ENT: Negative for sore throat. Cardiovascular: Negative for chest pain. Respiratory: Negative for shortness of breath. Gastrointestinal: Negative for abdominal pain, Positive for vomiting Genitourinary: Negative for dysuria. Musculoskeletal: Negative for back pain. Skin: Negative for rash. Neurological: Positive for headache  10-point ROS otherwise negative.  ____________________________________________   PHYSICAL EXAM:  VITAL SIGNS: ED Triage Vitals  Enc Vitals Group     BP 03/01/16 1420 120/96 mmHg     Pulse Rate 03/01/16 1420 72     Resp 03/01/16 1420 29     Temp 03/01/16 1420 97.7 F (36.5 C)     Temp Source 03/01/16 1420 Oral     SpO2 03/01/16 1420 100 %     Weight 03/01/16 1420 200 lb (90.719 kg)     Height 03/01/16 1420 5\' 8"  (1.727 m)     Head Cir --      Peak Flow --      Pain Score 03/01/16 1424 1     Pain Loc --      Pain Edu? --      Excl. in [place]? --     Constitutional: Alert and oriented. Well appearing and in no distress. Eyes: Conjunctivae are normal. PERRL. Normal extraocular movements. ENT   Head: Normocephalic, Small nasal bridge abrasion, small left periorbital soft tissue swelling    Nose: No congestion/rhinnorhea.   Mouth/Throat: Mucous membranes are moist.  Neck: No stridor. Cardiovascular: Normal rate, regular rhythm. No murmurs, rubs, or gallops. Respiratory: Normal respiratory effort without tachypnea nor retractions. Breath sounds are clear and equal bilaterally. No wheezes/rales/rhonchi. Gastrointestinal: Soft and nontender. Normal bowel sounds Musculoskeletal: Nontender with normal range of motion in all extremities. No lower extremity tenderness nor edema. Small abrasion noted over the left elbow Neurologic:  Normal speech and language. No gross focal neurologic  deficits are appreciated.  Skin:  Skin is warm, dry and intact. Facial abrasion and left elbow abrasion as dictated above Psychiatric: Mood and affect are normal. Speech and behavior are normal.  ____________________________________________  ED COURSE:  Pertinent labs & imaging results that were available during my care of the patient were reviewed by me and considered in my medical decision making (see chart for details). Patient presents to ER with recent head and face trauma, persistent headache and vomiting. I will obtain CT imaging of the head and reevaluate. ____________________________________________  RADIOLOGY  CT head IMPRESSION: 1. Normal CT appearance the brain. 2. Minimal left supraorbital soft tissue swelling without an underlying fracture. ____________________________________________  FINAL ASSESSMENT AND PLAN  Headache, facial trauma  Plan: Patient with imaging as dictated above. Patient presents ER after head injury, possible concussion. We will prescribe headache medication for her advised follow-up with her doctor in 2 days for recheck. She will also be given antiemetics. Neurologic exam currently is normal.   Earleen Newport, MD   Note: This dictation was prepared with Dragon dictation. Any transcriptional errors that result from this process are unintentional   Earleen Newport, MD 03/01/16 1540

## 2016-03-03 ENCOUNTER — Ambulatory Visit (INDEPENDENT_AMBULATORY_CARE_PROVIDER_SITE_OTHER): Payer: 59 | Admitting: Family Medicine

## 2016-03-03 ENCOUNTER — Encounter: Payer: Self-pay | Admitting: Family Medicine

## 2016-03-03 VITALS — BP 116/69 | HR 58 | Temp 98.5°F | Ht 68.0 in | Wt 196.5 lb

## 2016-03-03 DIAGNOSIS — S060X0A Concussion without loss of consciousness, initial encounter: Secondary | ICD-10-CM | POA: Diagnosis not present

## 2016-03-03 NOTE — Patient Instructions (Signed)

## 2016-03-03 NOTE — Progress Notes (Signed)
Dr. Frederico Hamman T. Alpha Mysliwiec, MD, [facility] [place], [address] Phone: [phone] Fax: [phone]  03/03/2016  Patient: Angela Moses, MRN: [id number], DOB: 1955-11-03, 60 y.o.  Primary Physician:  Owens Loffler, MD   Chief Complaint  Patient presents with  . Follow-up    [facility] ER-Fall on Saturday/Headache   Subjective:   Angela Moses is a 60 y.o. very pleasant female patient who presents with the following:  The patient fell and had a trauma on Saturday, where she fell and hit her face on a shelf on Saturday.  She did injure her nose and she has some minor abrasions.   That point she did not have any loss of consciousness.  On Sunday and Monday the patient had a fairly significant headache and had some vomiting..  She also had some mental fogginess, and her full ACE questionnaire can be reviewed in the record and is scanned in.  She was fairly symptomatic initially, but she promptly cleared.  She was able to work yesterday  Without limitation and did not feel worse or have a headache or nauseous at all while at work.  She was also able to drive today without difficulty.  Past Medical History, Surgical History, Social History, Family History, Problem List, Medications, and Allergies have been reviewed and updated if relevant.  Patient Active Problem List   Diagnosis Date Noted  . Primary osteoarthritis of both knees 03/26/2015  . Psoriasis 03/11/2015  . CERVICALGIA 12/27/2010  . VERTIGO 12/27/2010  . HYPERLIPIDEMIA 06/24/2009    Past Medical History  Diagnosis Date  . Plantar fasciitis   . HLD (hyperlipidemia)   . Psoriasis 03/11/2015    Past Surgical History  Procedure Laterality Date  . Tubes in ears      lt   . Tympanic membrane repair    . Tonsillectomy    . Wisdom tooth extraction    . Abdominal hysterectomy  1997  . Excision haglund's deformity with achilles tendon repair Right 08/08/2013      Procedure: RIGHT EXCISION HAGLUND'S DEFORMITY  WITH ACHILLES DEBRIDEMENT AND RECONSTRUCTION;  Surgeon: Wylene Simmer, MD;  Location: [place];  Service: Orthopedics;  Laterality: Right;  . Gastroc recession extremity Right 08/08/2013    Procedure: GASTROCNEMIUS RECESSION ;  Surgeon: Wylene Simmer, MD;  Location: [place];  Service: Orthopedics;  Laterality: Right;    Social History   Social History  . Marital Status: Married    Spouse Name: N/A  . Number of Children: N/A  . Years of Education: N/A   Occupational History  . Not on file.   Social History Main Topics  . Smoking status: Never Smoker   . Smokeless tobacco: Never Used  . Alcohol Use: Yes     Comment: occasional  . Drug Use: No  . Sexual Activity: Not on file   Other Topics Concern  . Not on file   Social History Narrative    Family History  Problem Relation Age of Onset  . Parkinsonism Father   . Macular degeneration Father   . Dementia Mother   . Diabetes Mother   . Breast cancer Maternal Grandmother     cause of death  . Breast cancer Paternal Grandmother     cause of death  . Heart attack Maternal Grandfather     cause of death  . Heart attack Paternal Grandfather     cause of death  No Known Allergies  Medication list reviewed and updated in full in [place].   GEN: No acute illnesses, no fevers, chills. GI: No n/v/d, eating normally Pulm: No SOB Interactive and getting along well at home.  Otherwise, ROS is as per the HPI.  Objective:   BP 116/69 mmHg  Pulse 58  Temp(Src) 98.5 F (36.9 C) (Oral)  Ht 5\' 8"  (1.727 m)  Wt 196 lb 8 oz (89.132 kg)  BMI 29.88 kg/m2  GEN: WDWN, NAD, Non-toxic, A & O x 3 HEENT: Atraumatic, Normocephalic. Neck supple. No masses, No LAD. Ears and Nose: No external deformity. CV: RRR, No M/G/R. No JVD. No thrill. No extra heart sounds. PULM: CTA B, no wheezes, crackles, rhonchi. No retractions. No resp. distress.  No accessory muscle use. EXTR: No c/c/e NEURO Normal gait.  PSYCH: Normally interactive. Conversant. Not depressed or anxious appearing.  Calm demeanor.   Neuro: CN 2-12 grossly intact. PERRLA. EOMI. Sensation intact throughout. Str 5/5 all extremities. DTR 2+. No clonus. A and o x 4. Romberg neg. Finger nose neg. Heel -shin neg.   BESS testing shows some unsteadiness on 1 foot with closed eyes as well as with a tandem standing position with closed eyes.    Laboratory and Imaging Data: Ct Head Wo Contrast  03/01/2016  CLINICAL DATA:  Headache and vomiting for 2 days. Head trauma on Saturday. Laceration to the nose. EXAM: CT HEAD WITHOUT CONTRAST TECHNIQUE: Contiguous axial images were obtained from the base of the skull through the vertex without intravenous contrast. COMPARISON:  None. FINDINGS: No acute cortical infarct, hemorrhage, or mass lesion is present. The ventricles are of normal size. No significant extra-axial fluid collection is evident. The paranasal sinuses and mastoid air cells are clear. The calvarium is intact. Mild left supraorbital soft tissue thickening is present. There is no underlying fracture. The paranasal sinuses mastoid air cells are clear. The calvarium is intact. No significant extracranial soft tissue lesion is present. IMPRESSION: 1. Normal CT appearance the brain. 2. Minimal left supraorbital soft tissue swelling without an underlying fracture. Electronically Signed   By: San Morelle M.D.   On: 03/01/2016 15:23     Assessment and Plan:   Concussion without loss of consciousness, initial encounter  >25 minutes spent in face to face time with patient, >50% spent in counselling or coordination of care  Multiple symptoms of concussion.  She did relatively rest for a few days, and today she is feeling better and asymptomatic.  Think that she is okay to work, but I did give her some strong precautions that if she started to feel bad at all then she needed to  stop and rest.  She has return of her headaches and nausea since or other kind of foggy feelings then she needs to take a few days off and rest and sleep.  I gave her a physical exertion progression additionally.  Follow-up: prn  Signed,  Judah Chevere T. Jamire Shabazz, MD   Patient's Medications  New Prescriptions   No medications on file  Previous Medications   ATORVASTATIN (LIPITOR) 20 MG TABLET    Take 1 tablet (20 mg total) by mouth daily.   BUTALBITAL-ACETAMINOPHEN-CAFFEINE (FIORICET) 50-325-40 MG TABLET    Take 1-2 tablets by mouth every 6 (six) hours as needed for headache.   DICLOFENAC (VOLTAREN) 75 MG EC TABLET    Take 1 tablet (75 mg total) by mouth 2 (two) times daily.   MULTIPLE VITAMINS-MINERALS (WOMENS MULTI PO)  Take 1 tablet by mouth daily.     ONDANSETRON (ZOFRAN) 4 MG TABLET    Take 1 tablet (4 mg total) by mouth daily as needed for nausea or vomiting.  Modified Medications   No medications on file  Discontinued Medications   No medications on file

## 2016-03-24 ENCOUNTER — Other Ambulatory Visit: Payer: Self-pay | Admitting: Family Medicine

## 2016-06-08 ENCOUNTER — Encounter: Payer: Self-pay | Admitting: Family Medicine

## 2016-07-31 IMAGING — CT CT HEAD W/O CM
2 series · 16 of 30 positions shown, 18 images · non-contrast
Comparison: None.

CLINICAL DATA: Headache and vomiting for 2 days. Head trauma on
[REDACTED]. Laceration to the nose.

EXAM:
CT HEAD WITHOUT CONTRAST
TECHNIQUE: Contiguous axial images were obtained from the base of the skull
through the vertex without intravenous contrast.

[Series 2: head wo · axial · 0.44mm/px · z∈[+261,+396]mm · 8 of 36 slices shown, 10 images]
[im 4/36  brain]
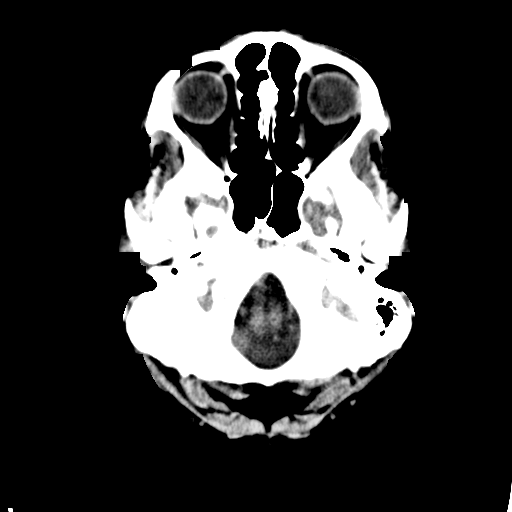
[im 4/36  bone]
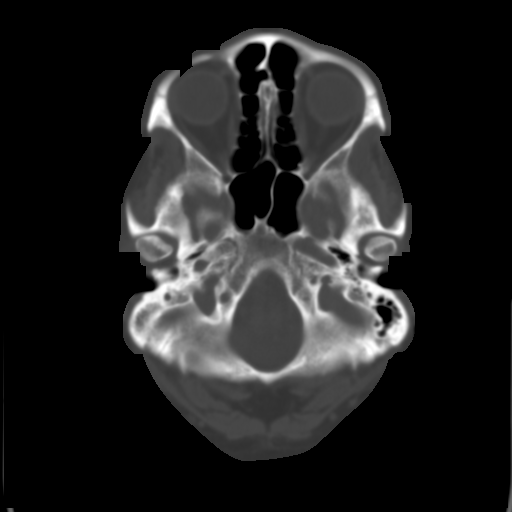
[im 8/36  brain]
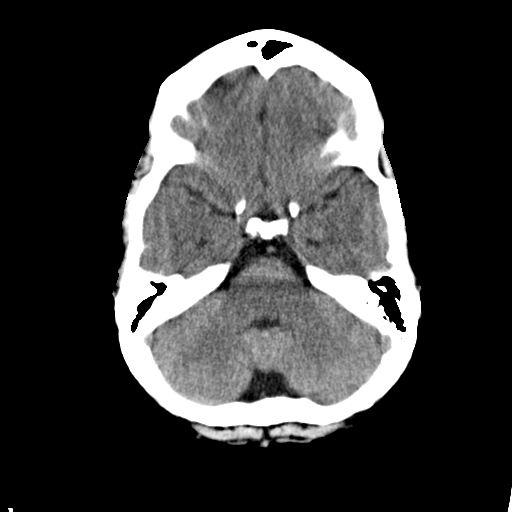
[im 12/36  brain]
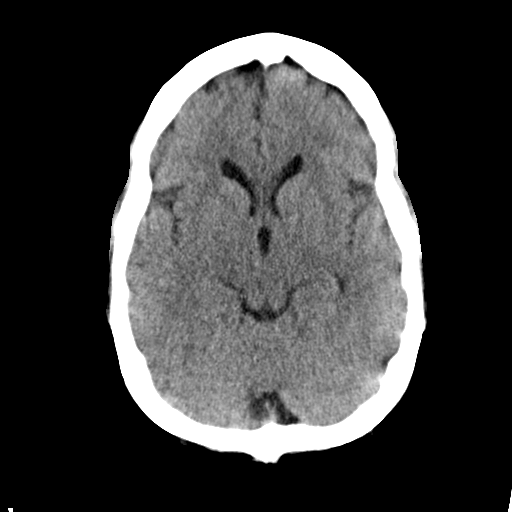
[im 16/36  brain]
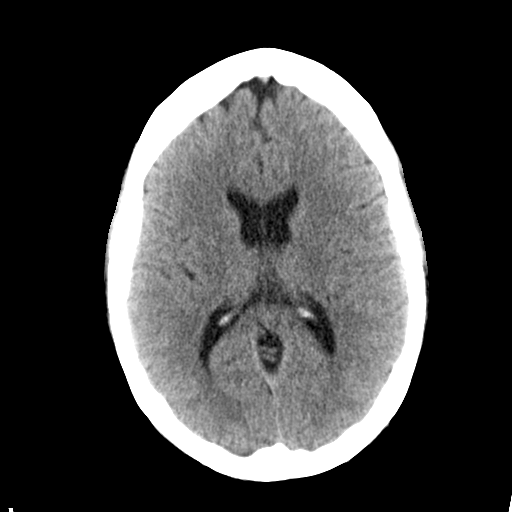
[im 20/36  brain]
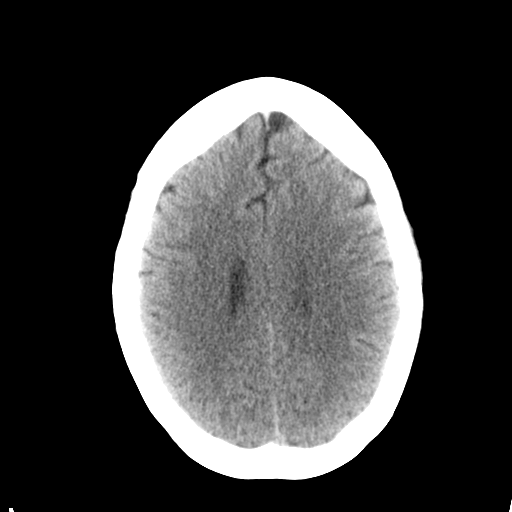
[im 20/36  bone]
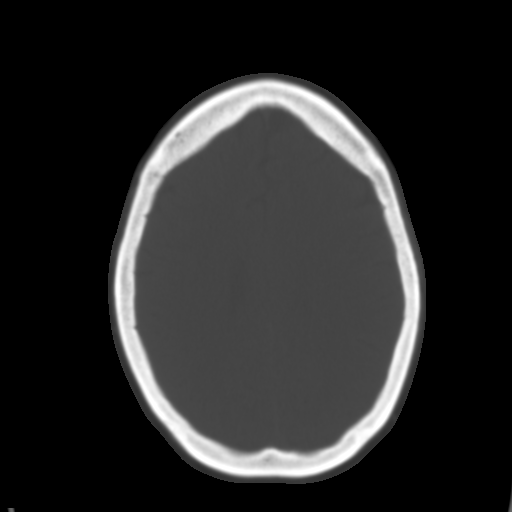
[im 24/36  brain]
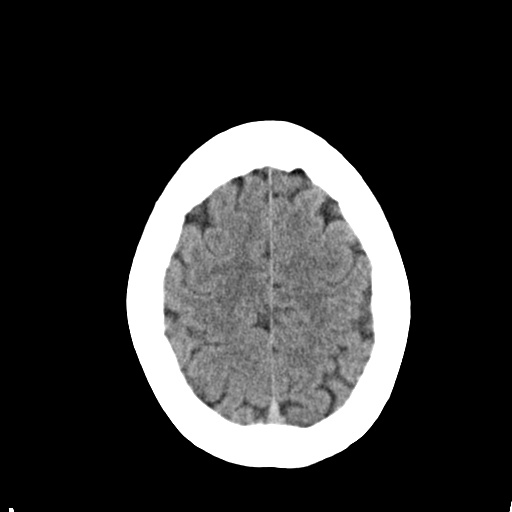
[im 28/36  brain]
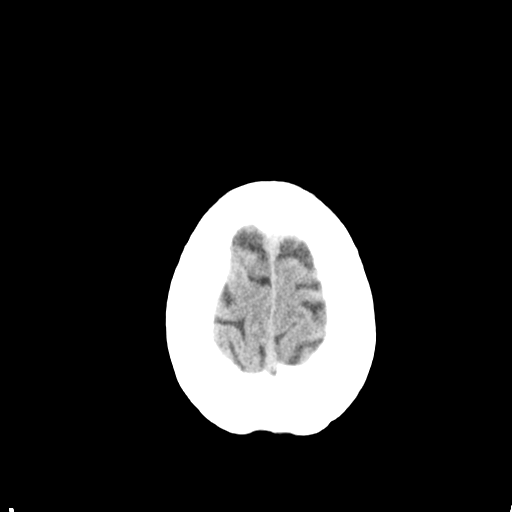
[im 32/36  brain]
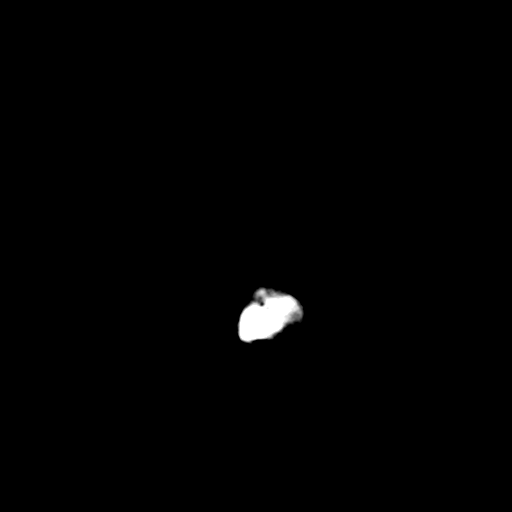

[Series 3: head bone · axial · 0.44mm/px · z∈[+262,+397]mm · 8 of 72 slices shown]
[im 8/72  bone]
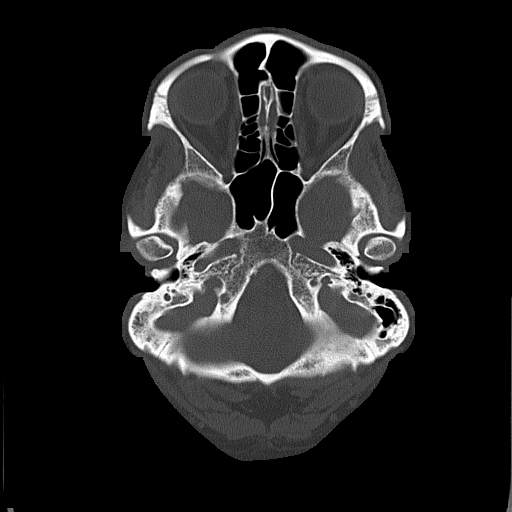
[im 15/72  bone]
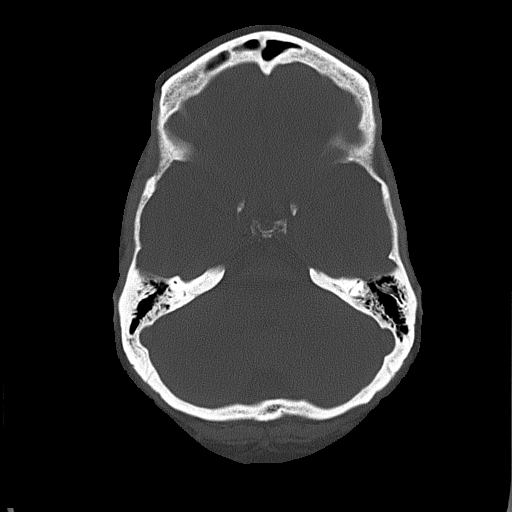
[im 23/72  bone]
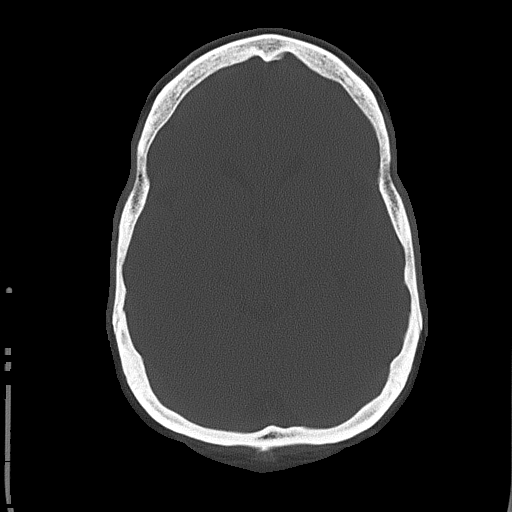
[im 30/72  bone]
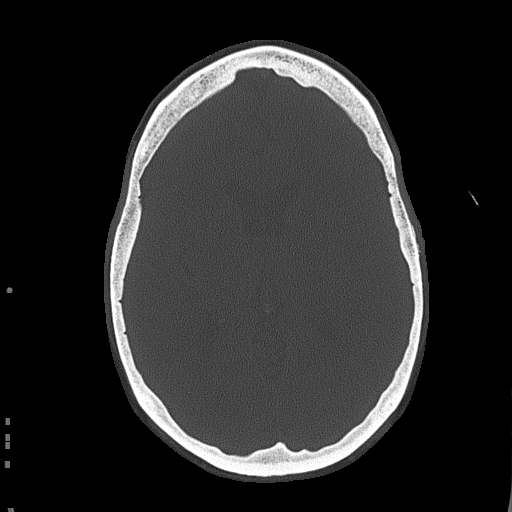
[im 42/72  bone]
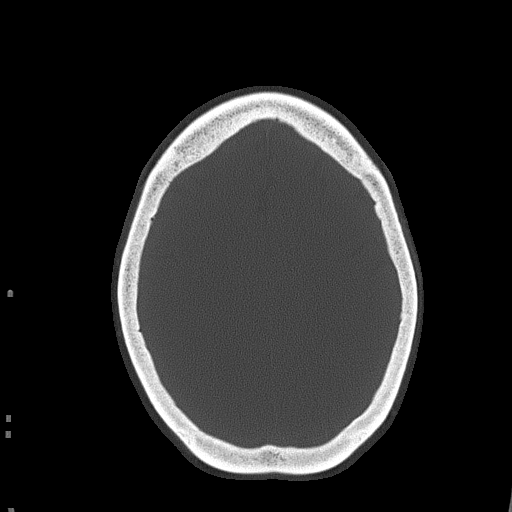
[im 49/72  bone]
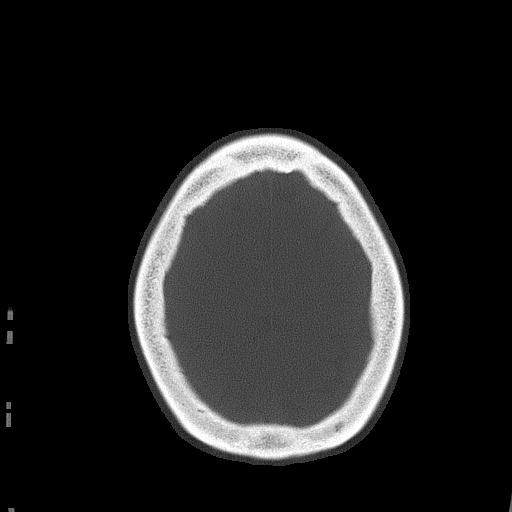
[im 57/72  bone]
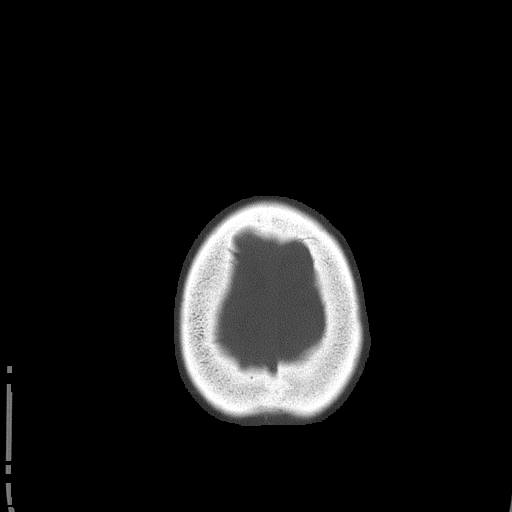
[im 64/72  bone]
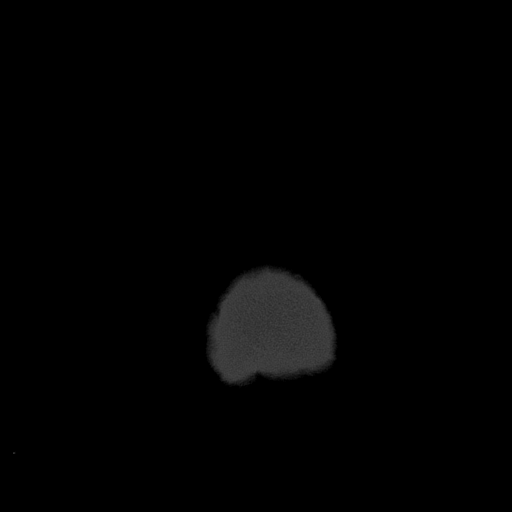

[16 of 30 positions shown; findings below may reference images not displayed]

FINDINGS: No acute cortical infarct, hemorrhage, or mass lesion is present.
The ventricles are of normal size. No significant extra-axial fluid
collection is evident. The paranasal sinuses and mastoid air cells
are clear. The calvarium is intact. Mild left supraorbital soft
tissue thickening is present. There is no underlying fracture. The
paranasal sinuses mastoid air cells are clear. The calvarium is
intact. No significant extracranial soft tissue lesion is present.
IMPRESSION: 1. Normal CT appearance the brain.
2. Minimal left supraorbital soft tissue swelling without an
underlying fracture.

## 2016-08-24 ENCOUNTER — Ambulatory Visit (INDEPENDENT_AMBULATORY_CARE_PROVIDER_SITE_OTHER): Payer: 59 | Admitting: Internal Medicine

## 2016-08-24 ENCOUNTER — Encounter: Payer: Self-pay | Admitting: Internal Medicine

## 2016-08-24 VITALS — BP 108/72 | HR 66 | Temp 98.1°F | Wt 188.8 lb

## 2016-08-24 DIAGNOSIS — G44201 Tension-type headache, unspecified, intractable: Secondary | ICD-10-CM | POA: Diagnosis not present

## 2016-08-24 NOTE — Progress Notes (Signed)
Subjective:    Patient ID: Angela Moses, female    DOB: 1956-05-28, 60 y.o.   MRN: [id number]  HPI  Pt presents to the clinic today with c/o headache. This started 2-3 days ago. The headache was located on the left side of her head 3 days ago. That has improved but she woke up this morning with pain at the base of her right skull. She describes the pain as achy. She denies pain in her neck. She denies changes in vision, dizziness, sensitivity to light or sound. She did have 1 episode of nausea and vomiting this morning. She has not tried anything OTC for her headache. She has no history of migraines or frequent headaches but did have a concussion in May 2017. She did not have subsequent headaches. She was given RX for Fioricet and Zofran, but she has not taken any of the prescription.  Review of Systems      Past Medical History:  Diagnosis Date  . HLD (hyperlipidemia)   . Plantar fasciitis   . Psoriasis 03/11/2015    Current Outpatient Prescriptions  Medication Sig Dispense Refill  . atorvastatin (LIPITOR) 20 MG tablet Take 1 tablet (20 mg total) by mouth daily. 30 tablet 11  . butalbital-acetaminophen-caffeine (FIORICET) 50-325-40 MG tablet Take 1-2 tablets by mouth every 6 (six) hours as needed for headache. 20 tablet 0  . diclofenac (VOLTAREN) 75 MG EC tablet TAKE 1 TABLET BY MOUTH TWICE A DAY 60 tablet 2  . Multiple Vitamins-Minerals (WOMENS MULTI PO) Take 1 tablet by mouth daily.      . ondansetron (ZOFRAN) 4 MG tablet Take 1 tablet (4 mg total) by mouth daily as needed for nausea or vomiting. 20 tablet 1   No current facility-administered medications for this visit.     No Known Allergies  Family History  Problem Relation Age of Onset  . Parkinsonism Father   . Macular degeneration Father   . Dementia Mother   . Diabetes Mother   . Breast cancer Maternal Grandmother     cause of death  . Breast cancer Paternal Grandmother     cause of death  . Heart attack  Maternal Grandfather     cause of death  . Heart attack Paternal Grandfather     cause of death    Social History   Social History  . Marital status: Married    Spouse name: N/A  . Number of children: N/A  . Years of education: N/A   Occupational History  . Not on file.   Social History Main Topics  . Smoking status: Never Smoker  . Smokeless tobacco: Never Used  . Alcohol use Yes     Comment: occasional  . Drug use: No  . Sexual activity: Not on file   Other Topics Concern  . Not on file   Social History Narrative  . No narrative on file     Constitutional: Pt reports headache. Denies fever, malaise, fatigue, or abrupt weight changes.  HEENT: Denies eye pain, eye redness, ear pain, ringing in the ears, wax buildup, runny nose, nasal congestion, bloody nose, or sore throat. Respiratory: Denies difficulty breathing, shortness of breath, cough or sputum production.   Gastrointestinal: Pt reports nausea and vomiting. Denies abdominal pain, bloating, constipation, diarrhea or blood in the stool.  GU: Denies urgency, frequency, pain with urination, burning sensation, blood in urine, odor or discharge.  Neurological: Denies dizziness, difficulty with memory, difficulty with speech or problems with balance and  coordination.    No other specific complaints in a complete review of systems (except as listed in HPI above).  Objective:   Physical Exam   BP 108/72   Pulse 66   Temp 98.1 F (36.7 C) (Oral)   Wt 188 lb 12 oz (85.6 kg)   SpO2 98%   BMI 28.70 kg/m  Wt Readings from Last 3 Encounters:  08/24/16 188 lb 12 oz (85.6 kg)  03/03/16 196 lb 8 oz (89.1 kg)  03/01/16 200 lb (90.7 kg)    General: Appears her stated age, well developed, well nourished in NAD. HEENT: Head: normal shape and size; Eyes: sclera white, no icterus, conjunctiva pink, PERRLA and EOMs intact;  Abdomen: Soft and nontender. Normal bowel sounds.  Neurological: Alert and oriented. Coordination  normal.   BMET    Component Value Date/Time   NA 144 10/16/2015 0843   K 4.0 10/16/2015 0843   CL 108 10/16/2015 0843   CO2 29 10/16/2015 0843   GLUCOSE 82 10/16/2015 0843   BUN 17 10/16/2015 0843   CREATININE 0.96 10/16/2015 0843   CALCIUM 9.5 10/16/2015 0843   GFRNONAA 62.59 08/19/2010 0853    Lipid Panel     Component Value Date/Time   CHOL 197 10/16/2015 0843   TRIG 141.0 10/16/2015 0843   HDL 53.50 10/16/2015 0843   CHOLHDL 4 10/16/2015 0843   VLDL 28.2 10/16/2015 0843   LDLCALC 115 (H) 10/16/2015 0843    CBC    Component Value Date/Time   WBC 6.7 10/16/2015 0843   RBC 4.52 10/16/2015 0843   HGB 12.8 10/16/2015 0843   HCT 39.5 10/16/2015 0843   PLT 268.0 10/16/2015 0843   MCV 87.6 10/16/2015 0843   MCHC 32.2 10/16/2015 0843   RDW 14.1 10/16/2015 0843   LYMPHSABS 2.0 10/16/2015 0843   MONOABS 0.5 10/16/2015 0843   EOSABS 0.3 10/16/2015 0843   BASOSABS 0.0 10/16/2015 0843    Hgb A1C No results found for: HGBA1C       Assessment & Plan:   Headache:  ? Muscular/tension in origin I do not want to give her anything IM, because she drove herself and she is at home by herself. Advised her to go home, take the Fioricet If still nauseated, take Zofran after 1 hour Ok to alternate Tylenol 1000 mg and Ibuprofen 800 mg every hours If pain persist throughout the night, return in the morning and I will give Toradol/Zofran IM- make sure your husband comes with you  Return precautions discussed Webb Silversmith, NP

## 2016-08-24 NOTE — Patient Instructions (Signed)
General Headache Without Cause Introduction A headache is pain or discomfort felt around the head or neck area. There are many causes and types of headaches. In some cases, the cause may not be found. Follow these instructions at home: Managing pain  Take over-the-counter and prescription medicines only as told by your doctor.  Lie down in a dark, quiet room when you have a headache.  If directed, apply ice to the head and neck area:  Put ice in a plastic bag.  Place a towel between your skin and the bag.  Leave the ice on for 20 minutes, 2-3 times per day.  Use a heating pad or hot shower to apply heat to the head and neck area as told by your doctor.  Keep lights dim if bright lights bother you or make your headaches worse. Eating and drinking  Eat meals on a regular schedule.  Lessen how much alcohol you drink.  Lessen how much caffeine you drink, or stop drinking caffeine. General instructions  Keep all follow-up visits as told by your doctor. This is important.  Keep a journal to find out if certain things bring on headaches. For example, write down:  What you eat and drink.  How much sleep you get.  Any change to your diet or medicines.  Relax by getting a massage or doing other relaxing activities.  Lessen stress.  Sit up straight. Do not tighten (tense) your muscles.  Do not use tobacco products. This includes cigarettes, chewing tobacco, or e-cigarettes. If you need help quitting, ask your doctor.  Exercise regularly as told by your doctor.  Get enough sleep. This often means 7-9 hours of sleep. Contact a doctor if:  Your symptoms are not helped by medicine.  You have a headache that feels different than the other headaches.  You feel sick to your stomach (nauseous) or you throw up (vomit).  You have a fever. Get help right away if:  Your headache becomes really bad.  You keep throwing up.  You have a stiff neck.  You have trouble  seeing.  You have trouble speaking.  You have pain in the eye or ear.  Your muscles are weak or you lose muscle control.  You lose your balance or have trouble walking.  You feel like you will pass out (faint) or you pass out.  You have confusion. This information is not intended to replace advice given to you by your health care provider. Make sure you discuss any questions you have with your health care provider. Document Released: 07/05/2008 Document Revised: 03/03/2016 Document Reviewed: 01/19/2015  2017 [employer]  

## 2016-09-30 ENCOUNTER — Other Ambulatory Visit: Payer: Self-pay | Admitting: Family Medicine

## 2016-12-17 ENCOUNTER — Other Ambulatory Visit: Payer: Self-pay | Admitting: Family Medicine

## 2017-01-12 DIAGNOSIS — M1712 Unilateral primary osteoarthritis, left knee: Secondary | ICD-10-CM | POA: Diagnosis not present

## 2017-01-12 DIAGNOSIS — M170 Bilateral primary osteoarthritis of knee: Secondary | ICD-10-CM | POA: Diagnosis not present

## 2017-01-16 ENCOUNTER — Other Ambulatory Visit: Payer: Self-pay | Admitting: *Deleted

## 2017-01-16 MED ORDER — ATORVASTATIN CALCIUM 20 MG PO TABS: 20.0000 mg | ORAL_TABLET | Freq: Every day | ORAL | 0 refills | Status: DC

## 2017-01-16 NOTE — Telephone Encounter (Signed)
Ok to refill for an additional month per Butch Penny; CPE scheduled for 02/2017

## 2017-01-17 DIAGNOSIS — H7413 Adhesive middle ear disease, bilateral: Secondary | ICD-10-CM | POA: Diagnosis not present

## 2017-01-17 DIAGNOSIS — H900 Conductive hearing loss, bilateral: Secondary | ICD-10-CM | POA: Diagnosis not present

## 2017-01-17 DIAGNOSIS — H6523 Chronic serous otitis media, bilateral: Secondary | ICD-10-CM | POA: Diagnosis not present

## 2017-01-26 DIAGNOSIS — H6522 Chronic serous otitis media, left ear: Secondary | ICD-10-CM | POA: Diagnosis not present

## 2017-01-26 DIAGNOSIS — H900 Conductive hearing loss, bilateral: Secondary | ICD-10-CM | POA: Diagnosis not present

## 2017-01-26 DIAGNOSIS — H7413 Adhesive middle ear disease, bilateral: Secondary | ICD-10-CM | POA: Diagnosis not present

## 2017-02-20 ENCOUNTER — Encounter: Payer: 59 | Admitting: Family Medicine

## 2017-02-25 ENCOUNTER — Other Ambulatory Visit: Payer: Self-pay | Admitting: Family Medicine

## 2017-02-27 DIAGNOSIS — L814 Other melanin hyperpigmentation: Secondary | ICD-10-CM | POA: Diagnosis not present

## 2017-02-27 DIAGNOSIS — L821 Other seborrheic keratosis: Secondary | ICD-10-CM | POA: Diagnosis not present

## 2017-02-27 DIAGNOSIS — D225 Melanocytic nevi of trunk: Secondary | ICD-10-CM | POA: Diagnosis not present

## 2017-02-27 DIAGNOSIS — L570 Actinic keratosis: Secondary | ICD-10-CM | POA: Diagnosis not present

## 2017-02-27 NOTE — Telephone Encounter (Signed)
Pt called to ck status of atorvastatin refill to [facility]; advised already done earlier today. Pt will ck with pharmacy.

## 2017-03-02 ENCOUNTER — Encounter: Payer: Self-pay | Admitting: Family Medicine

## 2017-03-02 ENCOUNTER — Ambulatory Visit (INDEPENDENT_AMBULATORY_CARE_PROVIDER_SITE_OTHER): Payer: 59 | Admitting: Family Medicine

## 2017-03-02 ENCOUNTER — Encounter: Payer: 59 | Admitting: Family Medicine

## 2017-03-02 VITALS — BP 100/70 | HR 65 | Temp 97.8°F | Ht 68.0 in | Wt 191.8 lb

## 2017-03-02 DIAGNOSIS — Z0000 Encounter for general adult medical examination without abnormal findings: Secondary | ICD-10-CM | POA: Diagnosis not present

## 2017-03-02 DIAGNOSIS — Z1159 Encounter for screening for other viral diseases: Secondary | ICD-10-CM

## 2017-03-02 DIAGNOSIS — Z114 Encounter for screening for human immunodeficiency virus [HIV]: Secondary | ICD-10-CM

## 2017-03-02 LAB — CBC WITH DIFFERENTIAL/PLATELET
BASOS ABS: 0.1 10*3/uL (ref 0.0–0.1)
BASOS PCT: 0.7 % (ref 0.0–3.0)
Eosinophils Absolute: 0.4 10*3/uL (ref 0.0–0.7)
Eosinophils Relative: 5.4 % — ABNORMAL HIGH (ref 0.0–5.0)
HEMATOCRIT: 36.4 % (ref 36.0–46.0)
HEMOGLOBIN: 11.4 g/dL — AB (ref 12.0–15.0)
LYMPHS PCT: 21.2 % (ref 12.0–46.0)
Lymphs Abs: 1.7 10*3/uL (ref 0.7–4.0)
MCHC: 31.4 g/dL (ref 30.0–36.0)
MCV: 79.9 fl (ref 78.0–100.0)
MONOS PCT: 6.6 % (ref 3.0–12.0)
Monocytes Absolute: 0.5 10*3/uL (ref 0.1–1.0)
NEUTROS ABS: 5.2 10*3/uL (ref 1.4–7.7)
Neutrophils Relative %: 66.1 % (ref 43.0–77.0)
PLATELETS: 256 10*3/uL (ref 150.0–400.0)
RBC: 4.55 Mil/uL (ref 3.87–5.11)
RDW: 15.1 % (ref 11.5–15.5)
WBC: 7.8 10*3/uL (ref 4.0–10.5)

## 2017-03-02 LAB — BASIC METABOLIC PANEL
BUN: 16 mg/dL (ref 6–23)
CALCIUM: 9.1 mg/dL (ref 8.4–10.5)
CHLORIDE: 107 meq/L (ref 96–112)
CO2: 31 meq/L (ref 19–32)
Creatinine, Ser: 0.96 mg/dL (ref 0.40–1.20)
GFR: 63.31 mL/min (ref >60.00)
Glucose, Bld: 89 mg/dL (ref 70–99)
Potassium: 3.9 mEq/L (ref 3.5–5.1)
SODIUM: 141 meq/L (ref 135–145)

## 2017-03-02 LAB — HEPATIC FUNCTION PANEL
ALBUMIN: 4 g/dL (ref 3.5–5.2)
ALK PHOS: 86 U/L (ref 39–117)
ALT: 20 U/L (ref 0–35)
AST: 20 U/L (ref 0–37)
Bilirubin, Direct: 0.1 mg/dL (ref 0.0–0.3)
TOTAL PROTEIN: 6.6 g/dL (ref 6.0–8.3)
Total Bilirubin: 0.4 mg/dL (ref 0.2–1.2)

## 2017-03-02 LAB — LIPID PANEL
Cholesterol: 158 mg/dL (ref 0–200)
HDL: 51 mg/dL (ref >39.00)
LDL Cholesterol: 83 mg/dL (ref 0–99)
NONHDL: 107.05
Total CHOL/HDL Ratio: 3
Triglycerides: 120 mg/dL (ref 0.0–149.0)
VLDL: 24 mg/dL (ref 0.0–40.0)

## 2017-03-02 LAB — TSH: TSH: 1.72 u[IU]/mL (ref 0.35–4.50)

## 2017-03-02 NOTE — Progress Notes (Signed)
Dr. Frederico Hamman T. Dupree Givler, MD, [facility] [place], [address] Phone: [phone] Fax: [phone]  03/02/2017  Patient: Angela Moses, MRN: [id number], DOB: [birth year], 61 y.o.  Primary Physician:  Owens Loffler, MD   Chief Complaint  Patient presents with  . Annual Exam   Subjective:   Angela Moses is a 61 y.o. pleasant patient who presents with the following:  Health Maintenance Summary Reviewed and updated, unless pt declines services.  Tobacco History Reviewed. Non-smoker Alcohol: No concerns, no excessive use Exercise Habits: Rare activity, rec at least 30 mins 5 times a week STD concerns: none Drug Use: None Birth control method: Menses regular: yes Lumps or breast concerns: no Breast Cancer Family History: no  Health Maintenance  Topic Date Due  . Hepatitis C Screening  [birth year]  . HIV Screening  07/26/1973  . INFLUENZA VACCINE  05/10/2017  . MAMMOGRAM  06/03/2018  . COLONOSCOPY  09/29/2019  . TETANUS/TDAP  10/01/2020    Immunization History  Administered Date(s) Administered  . Influenza Whole 07/10/2010, 10/01/2010  . Influenza,inj,Quad PF,36+ Mos 08/17/2015  . Td 03/10/2006, 10/01/2010   Patient Active Problem List   Diagnosis Date Noted  . Primary osteoarthritis of both knees 03/26/2015  . Psoriasis 03/11/2015  . CERVICALGIA 12/27/2010  . VERTIGO 12/27/2010  . HYPERLIPIDEMIA 06/24/2009   Past Medical History:  Diagnosis Date  . HLD (hyperlipidemia)   . Plantar fasciitis   . Psoriasis 03/11/2015   Past Surgical History:  Procedure Laterality Date  . ABDOMINAL HYSTERECTOMY  1997  . EXCISION HAGLUND'S DEFORMITY WITH ACHILLES TENDON REPAIR Right 08/08/2013   Procedure: RIGHT EXCISION HAGLUND'S DEFORMITY  WITH ACHILLES DEBRIDEMENT AND RECONSTRUCTION;  Surgeon: Wylene Simmer, MD;  Location: [facility];  Service: Orthopedics;  Laterality: Right;  . GASTROC  RECESSION EXTREMITY Right 08/08/2013   Procedure: GASTROCNEMIUS RECESSION ;  Surgeon: Wylene Simmer, MD;  Location: [facility];  Service: Orthopedics;  Laterality: Right;  . TONSILLECTOMY    . tubes in ears     lt   . TYMPANIC MEMBRANE REPAIR    . WISDOM TOOTH EXTRACTION     Social History   Social History  . Marital status: Married    Spouse name: N/A  . Number of children: N/A  . Years of education: N/A   Occupational History  . Not on file.   Social History Main Topics  . Smoking status: Never Smoker  . Smokeless tobacco: Never Used  . Alcohol use Yes     Comment: occasional  . Drug use: No  . Sexual activity: Not on file   Other Topics Concern  . Not on file   Social History Narrative  . No narrative on file   Family History  Problem Relation Age of Onset  . Parkinsonism Father   . Macular degeneration Father   . Dementia Mother   . Diabetes Mother   . Breast cancer Maternal Grandmother        cause of death  . Breast cancer Paternal Grandmother        cause of death  . Heart attack Maternal Grandfather        cause of death  . Heart attack Paternal Grandfather        cause of death   No Known Allergies  Medication list has been reviewed and updated.   General: Denies fever, chills, sweats. No significant weight loss. Eyes: Denies blurring,significant itching  ENT: Denies earache, sore throat, and hoarseness.  Cardiovascular: Denies chest pains, palpitations, dyspnea on exertion,  Respiratory: Denies cough, dyspnea at rest,wheeezing Breast: no concerns about lumps GI: Denies nausea, vomiting, diarrhea, constipation, change in bowel habits, abdominal pain, melena, hematochezia GU: Denies dysuria, hematuria, urinary hesitancy, nocturia, denies STD risk, no concerns about discharge Musculoskeletal: Ongoing knee pain, left greater than right Derm: Denies rash, itching Neuro: Denies  paresthesias, frequent falls, frequent headaches Psych:  Denies depression, anxiety Endocrine: Denies cold intolerance, heat intolerance, polydipsia Heme: Denies enlarged lymph nodes Allergy: No hayfever  Objective:   BP 100/70   Pulse 65   Temp 97.8 F (36.6 C) (Oral)   Ht '5\' 8"'  (1.727 m)   Wt 191 lb 12 oz (87 kg)   BMI 29.16 kg/m  No exam data present  GEN: well developed, well nourished, no acute distress Eyes: conjunctiva and lids normal, PERRLA, EOMI ENT: TM clear, nares clear, oral exam WNL Neck: supple, no lymphadenopathy, no thyromegaly, no JVD Pulm: clear to auscultation and percussion, respiratory effort normal CV: regular rate and rhythm, S1-S2, no murmur, rub or gallop, no bruits Chest: no scars, masses, no lumps BREAST: breast exam declined GI: soft, non-tender; no hepatosplenomegaly, masses; active bowel sounds all quadrants GU: GU exam declined Lymph: no cervical, axillary or inguinal adenopathy MSK: gait normal, muscle tone and strength WNL, no joint swelling, effusions, discoloration, crepitus  SKIN: clear, good turgor, color WNL, no rashes, lesions, or ulcerations Neuro: normal mental status, normal strength, sensation, and motion Psych: alert; oriented to person, place and time, normally interactive and not anxious or depressed in appearance.   All labs reviewed with patient. Today's labs are pending Lipids:    Component Value Date/Time   CHOL 197 10/16/2015 0843   TRIG 141.0 10/16/2015 0843   HDL 53.50 10/16/2015 0843   LDLDIRECT 90.6 03/08/2012 0735   VLDL 28.2 10/16/2015 0843   CHOLHDL 4 10/16/2015 0843   CBC: CBC Latest Ref Rng & Units 10/16/2015 05/07/2014 08/08/2013  WBC 4.0 - 10.5 K/uL 6.7 8.1 -  Hemoglobin 12.0 - 15.0 g/dL 12.8 13.7 11.0(L)  Hematocrit 36.0 - 46.0 % 39.5 41.5 -  Platelets 150.0 - 400.0 K/uL 268.0 221.0 -    Basic Metabolic Panel:    Component Value Date/Time   NA 144 10/16/2015 0843   K 4.0 10/16/2015 0843   CL 108 10/16/2015 0843   CO2 29 10/16/2015 0843   BUN 17  10/16/2015 0843   CREATININE 0.96 10/16/2015 0843   GLUCOSE 82 10/16/2015 0843   CALCIUM 9.5 10/16/2015 0843   Hepatic Function Latest Ref Rng & Units 10/16/2015 05/07/2014 03/08/2012  Total Protein 6.0 - 8.3 g/dL 6.6 7.0 6.3  Albumin 3.5 - 5.2 g/dL 3.9 3.9 3.7  AST 0 - 37 U/L '24 30 20  ' ALT 0 - 35 U/L 37(H) 39(H) 22  Alk Phosphatase 39 - 117 U/L 107 100 72  Total Bilirubin 0.2 - 1.2 mg/dL 0.4 0.6 0.7  Bilirubin, Direct 0.0 - 0.3 mg/dL 0.0 0.0 0.1    Lab Results  Component Value Date   TSH 2.32 10/16/2015   No results found.  Assessment and Plan:   Healthcare maintenance - Plan: Basic metabolic panel, CBC with Differential/Platelet, Hepatic function panel, Hepatitis C antibody, Lipid panel, TSH, HIV antibody  Screening for HIV (human immunodeficiency virus) - Plan: HIV antibody  Encounter for hepatitis C screening test for low risk patient - Plan: Hepatitis C antibody  Globally doing well with normal complaints  of the knee pain.  Health Maintenance Exam: The patient's preventative maintenance and recommended screening tests for an annual wellness exam were reviewed in full today. Brought up to date unless services declined.  Counselled on the importance of diet, exercise, and its role in overall health and mortality. The patient's FH and SH was reviewed, including their home life, tobacco status, and drug and alcohol status.  Follow-up in 1 year for physical exam or additional follow-up below.  Follow-up: No Follow-up on file. Or follow-up in 1 year if not noted.  No orders of the defined types were placed in this encounter.  There are no discontinued medications. Orders Placed This Encounter  Procedures  . Basic metabolic panel  . CBC with Differential/Platelet  . Hepatic function panel  . Hepatitis C antibody  . Lipid panel  . TSH  . HIV antibody    Signed,  Frederico Hamman T. Aikeem Lilley, MD   Allergies as of 03/02/2017   No Known Allergies     Medication List        Accurate as of 03/02/17 10:41 AM. Always use your most recent med list.          atorvastatin 20 MG tablet Commonly known as:  LIPITOR TAKE 1 TABLET BY MOUTH ONCE A DAY *NEED OFFICE VISIT   diclofenac 75 MG EC tablet Commonly known as:  VOLTAREN TAKE 1 TABLET BY MOUTH TWICE A DAY   ondansetron 4 MG tablet Commonly known as:  ZOFRAN Take 1 tablet (4 mg total) by mouth daily as needed for nausea or vomiting.   WOMENS MULTI PO Take 1 tablet by mouth daily.

## 2017-03-03 LAB — HIV ANTIBODY (ROUTINE TESTING W REFLEX): HIV 1&2 Ab, 4th Generation: NONREACTIVE

## 2017-03-03 LAB — HEPATITIS C ANTIBODY: HCV AB: NEGATIVE

## 2017-03-07 ENCOUNTER — Telehealth: Payer: Self-pay | Admitting: *Deleted

## 2017-03-07 DIAGNOSIS — D649 Anemia, unspecified: Secondary | ICD-10-CM

## 2017-03-07 NOTE — Telephone Encounter (Signed)
Ms. Templin notified as instructed by telephone.  Lab appointment scheduled for 04/17/2017 at 8:25 am.  Future lab orders placed in Epic.

## 2017-03-07 NOTE — Telephone Encounter (Signed)
-----   Message from Owens Loffler, MD sent at 03/07/2017  7:52 AM EDT ----- Can you call?  Labs all look good, except mildly anemic.   I would just repeat in about 6 weeks, lab draw only.   At that time:  CBC with diff, IFOB card: anemia

## 2017-03-30 ENCOUNTER — Telehealth: Payer: Self-pay | Admitting: Family Medicine

## 2017-03-30 ENCOUNTER — Other Ambulatory Visit: Payer: Self-pay | Admitting: Family Medicine

## 2017-03-30 NOTE — Telephone Encounter (Signed)
Pt called wanting to get her labs mailed to her  [address] [place], [place] [address]

## 2017-03-30 NOTE — Telephone Encounter (Signed)
Labs mailed to patient as requested.

## 2017-04-17 ENCOUNTER — Other Ambulatory Visit (INDEPENDENT_AMBULATORY_CARE_PROVIDER_SITE_OTHER): Payer: 59

## 2017-04-17 ENCOUNTER — Other Ambulatory Visit: Payer: 59 | Admitting: Family Medicine

## 2017-04-17 DIAGNOSIS — D649 Anemia, unspecified: Secondary | ICD-10-CM

## 2017-04-17 LAB — CBC WITH DIFFERENTIAL/PLATELET
BASOS ABS: 0.1 10*3/uL (ref 0.0–0.1)
Basophils Relative: 0.9 % (ref 0.0–3.0)
Eosinophils Absolute: 0.3 10*3/uL (ref 0.0–0.7)
Eosinophils Relative: 4.1 % (ref 0.0–5.0)
HCT: 41.2 % (ref 36.0–46.0)
HEMOGLOBIN: 13.3 g/dL (ref 12.0–15.0)
LYMPHS ABS: 1.9 10*3/uL (ref 0.7–4.0)
Lymphocytes Relative: 28 % (ref 12.0–46.0)
MCHC: 32.3 g/dL (ref 30.0–36.0)
MCV: 83.2 fl (ref 78.0–100.0)
Monocytes Absolute: 0.6 10*3/uL (ref 0.1–1.0)
Monocytes Relative: 9 % (ref 3.0–12.0)
NEUTROS PCT: 58 % (ref 43.0–77.0)
Neutro Abs: 4 10*3/uL (ref 1.4–7.7)
Platelets: 229 10*3/uL (ref 150.0–400.0)
RBC: 4.96 Mil/uL (ref 3.87–5.11)
RDW: 21.3 % — ABNORMAL HIGH (ref 11.5–15.5)
WBC: 6.8 10*3/uL (ref 4.0–10.5)

## 2017-04-29 ENCOUNTER — Other Ambulatory Visit: Payer: Self-pay | Admitting: Family Medicine

## 2017-05-17 DIAGNOSIS — M1712 Unilateral primary osteoarthritis, left knee: Secondary | ICD-10-CM | POA: Diagnosis not present

## 2017-05-18 DIAGNOSIS — Z01419 Encounter for gynecological examination (general) (routine) without abnormal findings: Secondary | ICD-10-CM | POA: Diagnosis not present

## 2017-05-25 DIAGNOSIS — M1712 Unilateral primary osteoarthritis, left knee: Secondary | ICD-10-CM | POA: Diagnosis not present

## 2017-05-30 DIAGNOSIS — H7413 Adhesive middle ear disease, bilateral: Secondary | ICD-10-CM | POA: Diagnosis not present

## 2017-05-30 DIAGNOSIS — H6983 Other specified disorders of Eustachian tube, bilateral: Secondary | ICD-10-CM | POA: Diagnosis not present

## 2017-05-30 DIAGNOSIS — H900 Conductive hearing loss, bilateral: Secondary | ICD-10-CM | POA: Diagnosis not present

## 2017-05-31 DIAGNOSIS — H6983 Other specified disorders of Eustachian tube, bilateral: Secondary | ICD-10-CM | POA: Diagnosis not present

## 2017-05-31 DIAGNOSIS — H7413 Adhesive middle ear disease, bilateral: Secondary | ICD-10-CM | POA: Diagnosis not present

## 2017-05-31 DIAGNOSIS — H900 Conductive hearing loss, bilateral: Secondary | ICD-10-CM | POA: Diagnosis not present

## 2017-06-02 DIAGNOSIS — M1712 Unilateral primary osteoarthritis, left knee: Secondary | ICD-10-CM | POA: Diagnosis not present

## 2017-06-09 ENCOUNTER — Encounter: Payer: Self-pay | Admitting: Family Medicine

## 2017-06-09 DIAGNOSIS — Z1231 Encounter for screening mammogram for malignant neoplasm of breast: Secondary | ICD-10-CM | POA: Diagnosis not present

## 2017-07-04 ENCOUNTER — Other Ambulatory Visit: Payer: Self-pay | Admitting: Family Medicine

## 2017-07-04 NOTE — Telephone Encounter (Signed)
Last office visit 03/02/2017.  Last refilled 05/01/2017 for #[id number] with no refills.  Ok to refill?

## 2017-07-12 DIAGNOSIS — H903 Sensorineural hearing loss, bilateral: Secondary | ICD-10-CM | POA: Diagnosis not present

## 2017-07-12 DIAGNOSIS — H7413 Adhesive middle ear disease, bilateral: Secondary | ICD-10-CM | POA: Diagnosis not present

## 2017-07-12 DIAGNOSIS — H6983 Other specified disorders of Eustachian tube, bilateral: Secondary | ICD-10-CM | POA: Diagnosis not present

## 2017-07-13 DIAGNOSIS — M1712 Unilateral primary osteoarthritis, left knee: Secondary | ICD-10-CM | POA: Diagnosis not present

## 2017-08-10 ENCOUNTER — Ambulatory Visit: Payer: Self-pay | Admitting: Orthopedic Surgery

## 2017-08-21 ENCOUNTER — Telehealth: Payer: Self-pay | Admitting: *Deleted

## 2017-08-21 NOTE — Telephone Encounter (Signed)
Copied from [facility]. Topic: General - Other >> Aug 21, 2017  9:32 AM Bea Graff, NT wrote: Reason for CRM: Patient is scheduled to have surgery with [facility] for her left knee replacement on 09/11/17. She is needing a medical clearance faxed to [facility] stating she is ok to proceed with surgery. Fax#: [facility] [phone] Surgical Scheduler for Dr. Maureen Ralphs. Contact you patient once this has been faxed. May leave detailed voicemail.

## 2017-08-21 NOTE — Telephone Encounter (Signed)
Surgical Clearance appt (30 min) scheduled with Dr. Lorelei Pont on 08/23/17, message routed to him so he is aware of where to send her surgical info to after her appt on Wednesday

## 2017-08-22 ENCOUNTER — Encounter (HOSPITAL_COMMUNITY): Payer: Self-pay | Admitting: *Deleted

## 2017-08-23 ENCOUNTER — Other Ambulatory Visit: Payer: Self-pay

## 2017-08-23 ENCOUNTER — Encounter: Payer: Self-pay | Admitting: Family Medicine

## 2017-08-23 ENCOUNTER — Ambulatory Visit: Payer: 59 | Admitting: Family Medicine

## 2017-08-23 VITALS — BP 108/60 | HR 76 | Temp 98.3°F | Ht 68.0 in | Wt 200.5 lb

## 2017-08-23 DIAGNOSIS — Z01818 Encounter for other preprocedural examination: Secondary | ICD-10-CM | POA: Diagnosis not present

## 2017-08-23 DIAGNOSIS — M1712 Unilateral primary osteoarthritis, left knee: Secondary | ICD-10-CM

## 2017-08-23 DIAGNOSIS — Z23 Encounter for immunization: Secondary | ICD-10-CM

## 2017-08-23 NOTE — Progress Notes (Signed)
Dr. Frederico Hamman T. Pawel Soules, MD, [facility] [place], [address] Phone: [phone] Fax: [phone]  08/23/2017  Patient: Angela Moses, MRN: [id number], DOB: 1956-07-24, 61 y.o.  Primary Physician:  Owens Loffler, MD   Chief Complaint  Patient presents with  . Surgical Clearance    Total Left Knee Replacement   Subjective:   Dear Dr. Ricki Rodriguez:  Thank you for having me see OLUWATAMILORE STARNES in consultation today at [facility] for her problem with knee pain, evaluation for medical clearance for knee replacement.  As you may recall, she is a 61 y.o. year old female with a history of hyperlipidemia and B knee arthritis, left worse than the right.  She has an upcoming L total knee replacement.   She has no chest pain, shortness of breath, and no history of smoking.  Able to walk 1/2 mile.  Able to do yard work.   No other complaints.  Preop labs pending  Patient Active Problem List   Diagnosis Date Noted  . Primary osteoarthritis of both knees 03/26/2015  . Psoriasis 03/11/2015  . CERVICALGIA 12/27/2010  . VERTIGO 12/27/2010  . HYPERLIPIDEMIA 06/24/2009    Past Medical History:  Diagnosis Date  . HLD (hyperlipidemia)   . Plantar fasciitis   . Psoriasis 03/11/2015    Past Surgical History:  Procedure Laterality Date  . ABDOMINAL HYSTERECTOMY  1997  . TONSILLECTOMY    . tubes in ears     lt   . TYMPANIC MEMBRANE REPAIR    . WISDOM TOOTH EXTRACTION      Social History   Socioeconomic History  . Marital status: Married    Spouse name: Not on file  . Number of children: Not on file  . Years of education: Not on file  . Highest education level: Not on file  Social Needs  . Financial resource strain: Not on file  . Food insecurity - worry: Not on file  . Food insecurity - inability: Not on file  . Transportation needs - medical: Not on file  . Transportation needs -  non-medical: Not on file  Occupational History  . Not on file  Tobacco Use  . Smoking status: Never Smoker  . Smokeless tobacco: Never Used  Substance and Sexual Activity  . Alcohol use: Yes    Comment: occasional  . Drug use: No  . Sexual activity: Not on file  Other Topics Concern  . Not on file  Social History Narrative  . Not on file    Family History  Problem Relation Age of Onset  . Parkinsonism Father   . Macular degeneration Father   . Dementia Mother   . Diabetes Mother   . Breast cancer Maternal Grandmother        cause of death  . Breast cancer Paternal Grandmother        cause of death  . Heart attack Maternal Grandfather        cause of death  . Heart attack Paternal Grandfather        cause of death    No Known Allergies  Medication list reviewed and updated in full in [place].   GEN: No fevers, chills. Nontoxic. Primarily MSK c/o today. MSK: Detailed in the HPI GI: tolerating PO intake without difficulty Neuro: No numbness, parasthesias, or tingling associated. Otherwise the pertinent positives of the ROS are noted above.   Objective:  BP 108/60   Pulse 76   Temp 98.3 F (36.8 C) (Oral)   Ht '5\' 8"'  (1.727 m)   Wt 200 lb 8 oz (90.9 kg)   BMI 30.49 kg/m    GEN: WDWN, NAD, Non-toxic, A & O x 3 HEENT: Atraumatic, Normocephalic. Neck supple. No masses, No LAD. Ears and Nose: No external deformity. CV: RRR, No M/G/R. No JVD. No thrill. No extra heart sounds. PULM: CTA B, no wheezes, crackles, rhonchi. No retractions. No resp. distress. No accessory muscle use. ABD: S, NT, ND, +BS. No rebound. No HSM. EXTR: No c/c/e NEURO Normal gait.  PSYCH: Normally interactive. Conversant. Not depressed or anxious appearing.  Calm demeanor.    Radiology:  Results for orders placed or performed in visit on 04/17/17  CBC with Differential/Platelet  Result Value Ref Range   WBC 6.8 4.0 - 10.5 K/uL   RBC 4.96 3.87 - 5.11 Mil/uL   Hemoglobin 13.3  12.0 - 15.0 g/dL   HCT 41.2 36.0 - 46.0 %   MCV 83.2 78.0 - 100.0 fl   MCHC 32.3 30.0 - 36.0 g/dL   RDW 21.3 (H) 11.5 - 15.5 %   Platelets 229.0 150.0 - 400.0 K/uL   Neutrophils Relative % 58.0 43.0 - 77.0 %   Lymphocytes Relative 28.0 12.0 - 46.0 %   Monocytes Relative 9.0 3.0 - 12.0 %   Eosinophils Relative 4.1 0.0 - 5.0 %   Basophils Relative 0.9 0.0 - 3.0 %   Neutro Abs 4.0 1.4 - 7.7 K/uL   Lymphs Abs 1.9 0.7 - 4.0 K/uL   Monocytes Absolute 0.6 0.1 - 1.0 K/uL   Eosinophils Absolute 0.3 0.0 - 0.7 K/uL   Basophils Absolute 0.1 0.0 - 0.1 K/uL    Lab Results  Component Value Date   WBC 6.8 04/17/2017   HGB 13.3 04/17/2017   HCT 41.2 04/17/2017   PLT 229.0 04/17/2017   GLUCOSE 89 03/02/2017   CHOL 158 03/02/2017   TRIG 120.0 03/02/2017   HDL 51.00 03/02/2017   LDLDIRECT 90.6 03/08/2012   LDLCALC 83 03/02/2017   ALT 20 03/02/2017   AST 20 03/02/2017   NA 141 03/02/2017   K 3.9 03/02/2017   CL 107 03/02/2017   CREATININE 0.96 03/02/2017   BUN 16 03/02/2017   CO2 31 03/02/2017   TSH 1.72 03/02/2017     Assessment and Plan:   Pre-operative clearance - Plan: EKG 12-Lead  Need for prophylactic vaccination and inoculation against influenza - Plan: Flu Vaccine QUAD 36+ mos IM  Localized osteoarthritis of left knee   EKG: Normal sinus rhythm. Normal axis, normal R wave progression, No acute ST elevation or depression.   Patient has minimal cardiac risk and easily can achieve a 4 MET equivalent of exercise.  No need for further testing.   Potential benefits of total knee replacement outweigh potential risks.   Follow-up: none  Orders Placed This Encounter  Procedures  . Flu Vaccine QUAD 36+ mos IM  . EKG 12-Lead   Feel free to contact me with any questions.  Signed,  Maud Deed. Miron Marxen, MD     Medication List        Accurate as of 08/23/17  4:09 PM. Always use your most recent med list.          atorvastatin 20 MG tablet Commonly known as:   LIPITOR Take 1 tablet (20 mg total) by mouth daily.   diclofenac 75 MG EC tablet Commonly known as:  VOLTAREN  TAKE 1 TABLET BY MOUTH TWICE A DAY

## 2017-08-26 ENCOUNTER — Ambulatory Visit: Payer: Self-pay | Admitting: Orthopedic Surgery

## 2017-08-26 NOTE — H&P (Signed)
Angela Moses DOB: [birth year] Married / Language: Cleophus Molt / Race: White Female Date of admission: September 11, 2017  Chief complaint: Left knee pain History of Present Illness  The patient is a 61 year old female who comes in for a preoperative History and Physical. The patient is scheduled for a left total knee arthroplasty to be performed by Dr. Dione Plover. Aluisio, MD at [facility] on 09-11-2017. The patient is a 61 year old female who presents today for follow up of their knee. The patient is being followed for their left knee pain and osteoarthritis. They are now 6 week(s) out from Euflexxa series. Symptoms reported today include: pain, pain after sitting, swelling, aching, pain with weightbearing and difficulty ambulating. The patient feels that they are doing poorly (she has worsened) and report their pain level to be moderate to severe. The following medication has been used for pain control: Diclofenac. The patient has not gotten any relief of their symptoms with Cortisone injections, NSAIDs or physical therapy. The patient indicates that they have questions or concerns today regarding pain and their progress at this point. Unfortunately the LEFT knee is getting progressively worse with time. Injections have not been of benefit. She has pain with all activities even has pain at rest. She is not having swelling. She is not having instability symptoms. She is not having any low back pain and lower extremity weakness or paresthesia. Her RIGHT knee is currently not hurting. They have been treated conservatively in the past for the above stated problem and despite conservative measures, they continue to have progressive pain and severe functional limitations and dysfunction. They have failed non-operative management including home exercise, medications, and injections. It is felt that they would benefit from undergoing total joint replacement. Risks and benefits of the procedure have  been discussed with the patient and they elect to proceed with surgery. There are no active contraindications to surgery such as ongoing infection or rapidly progressive neurological disease.   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 father Cancer  First Degree Relatives. father Chronic Obstructive Lung Disease  mother  Social History  Exercise  Exercises daily; does running / walking, other and gym / weights Alcohol use  current drinker; drinks beer; only occasionally per week Children  2 Tobacco use  Never smoker. never smoker Drug/Alcohol Rehab (Currently)  no Drug/Alcohol Rehab (Previously)  no Current work status  working full time Living situation  live with spouse Marital status  married Number of flights of stairs before winded  4-5 Illicit drug use  no Pain Contract  no Tobacco / smoke exposure  no Post-Surgical Plans  Home With Caregiver, Home, Straight to Outpatient Therapy.  Medication History Clobetasol Propionate (0.05% Cream, External as needed) Active. Diclofenac Sodium (75MG  Tablet DR, Oral) Active. Atorvastatin Calcium (20MG  Tablet, Oral) Active. Multivitamin (Oral) Specific strength unknown - Active.   Past Surgical History Hysterectomy  partial (non-cancerous) Tonsillectomy  Bone Spur Removal Right Heel  Date: 08/2014.    Review of Systems General Not Present- Chills, Fatigue, Fever, Memory Loss, Night Sweats, Weight Gain and Weight Loss. Skin Present- Psoriasis. Not Present- Eczema, Hives, Itching, Lesions and Rash. HEENT Not Present- Dentures, Double Vision, Headache, Hearing Loss, Tinnitus and  Visual  Loss. Respiratory Not Present- Allergies, Chronic Cough, Coughing up blood, Shortness of breath at rest and Shortness of breath with exertion. Cardiovascular Not Present- Chest Pain, Difficulty Breathing Lying Down, Murmur, Palpitations, Racing/skipping heartbeats and Swelling. Gastrointestinal Not Present- Abdominal Pain, Bloody Stool, Constipation, Diarrhea, Difficulty Swallowing, Heartburn, Jaundice, Loss of appetitie, Nausea and Vomiting. Female Genitourinary Not Present- Blood in Urine, Discharge, Flank Pain, Incontinence, Painful Urination, Urgency, Urinary frequency, Urinary Retention, Urinating at Night and Weak urinary stream. Musculoskeletal Present- Joint Pain. Not Present- Back Pain, Joint Swelling, Morning Stiffness, Muscle Pain, Muscle Weakness and Spasms. Neurological Not Present- Blackout spells, Difficulty with balance, Dizziness, Paralysis, Tremor and Weakness. Psychiatric Not Present- Insomnia.  Vitals Weight: 190 lb Height: 68in Weight was reported by patient. Height was reported by patient. Body Surface Area: 2 m Body Mass Index: 28.89 kg/m  Pulse: 60 (Regular)  BP: 132/74 (Sitting, Right Arm, Standard)   Physical Exam General Mental Status -Alert, cooperative and good historian. General Appearance-pleasant, Not in acute distress. Orientation-Oriented X3. Build & Nutrition-Well nourished and Well developed.  Head and Neck Head-normocephalic, atraumatic . Neck Global Assessment - supple, no bruit auscultated on the right, no bruit auscultated on the left.  Eye Pupil - Bilateral-Regular and Round. Motion - Bilateral-EOMI.  Chest and Lung Exam Auscultation Breath sounds - clear at anterior chest wall and clear at posterior chest wall. Adventitious sounds - No Adventitious sounds.  Cardiovascular Auscultation Rhythm - Regular rate and rhythm. Heart Sounds - S1 WNL and S2 WNL. Murmurs & Other Heart Sounds - Auscultation of the heart  reveals - No Murmurs.  Abdomen Palpation/Percussion Tenderness - Abdomen is non-tender to palpation. Rigidity (guarding) - Abdomen is soft. Auscultation Auscultation of the abdomen reveals - Bowel sounds normal.  Female Genitourinary Note: Not done, not pertinent to present illness   Musculoskeletal Note: Examination of the right hip shows flexion to 120 rotation in 30 abduction 40 and external rotation of 40. There is no tenderness over the greater trochanter. There is no pain on provocative testing of the hip.Evaluation of the left hip shows flexion to 120 rotation in 30 out 40 and abduction 40 without discomfort. There is no tenderness over the greater trochanter. There is no pain on provocative testing of the hip. Her RIGHT knee shows no effusion. Range of motion is 0-125. There is some crepitus on range of motion. There is no tenderness or instability. LEFT knee varus deformity. Is no effusion. Range 5-120. Marked crepitus on range of motion with tender medial grade and lateral no instability. Full sensation motor intact both lower extremities. Radiographs are reviewed AP and lateral of the knee showing that she has bone-on-bone arthritis in the medial and patellofemoral compartments of the LEFT knee. Please add that her gait pattern is significantly antalgic on the LEFT.   Assessment & Plan Primary osteoarthritis of left knee (M17.12)  Note:Surgical Plans: Left Total Knee Replacement  Disposition: Home with husband, Straight to outpatient therapy at [facility]  PCP: Dr. Edilia Bo - pending  IV TXA  Anesthesia Issues: None  Patient was instructed on what medications to stop prior to surgery.  Patient has walker and crutches at home already.  Signed electronically by Joelene Millin, III PA-C

## 2017-08-26 NOTE — H&P (View-Only) (Signed)
Angela Moses DOB: 01-02-1956 Married / Language: Cleophus Molt / Race: White Female Date of admission: September 11, 2017  Chief complaint: Left knee pain History of Present Illness  The patient is a 61 year old female who comes in for a preoperative History and Physical. The patient is scheduled for a left total knee arthroplasty to be performed by Dr. Dione Plover. Aluisio, MD at [facility] on 09-11-2017. The patient is a 61 year old female who presents today for follow up of their knee. The patient is being followed for their left knee pain and osteoarthritis. They are now 6 week(s) out from Euflexxa series. Symptoms reported today include: pain, pain after sitting, swelling, aching, pain with weightbearing and difficulty ambulating. The patient feels that they are doing poorly (she has worsened) and report their pain level to be moderate to severe. The following medication has been used for pain control: Diclofenac. The patient has not gotten any relief of their symptoms with Cortisone injections, NSAIDs or physical therapy. The patient indicates that they have questions or concerns today regarding pain and their progress at this point. Unfortunately the LEFT knee is getting progressively worse with time. Injections have not been of benefit. She has pain with all activities even has pain at rest. She is not having swelling. She is not having instability symptoms. She is not having any low back pain and lower extremity weakness or paresthesia. Her RIGHT knee is currently not hurting. They have been treated conservatively in the past for the above stated problem and despite conservative measures, they continue to have progressive pain and severe functional limitations and dysfunction. They have failed non-operative management including home exercise, medications, and injections. It is felt that they would benefit from undergoing total joint replacement. Risks and benefits of the procedure have  been discussed with the patient and they elect to proceed with surgery. There are no active contraindications to surgery such as ongoing infection or rapidly progressive neurological disease.   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 father Cancer  First Degree Relatives. father Chronic Obstructive Lung Disease  mother  Social History  Exercise  Exercises daily; does running / walking, other and gym / weights Alcohol use  current drinker; drinks beer; only occasionally per week Children  2 Tobacco use  Never smoker. never smoker Drug/Alcohol Rehab (Currently)  no Drug/Alcohol Rehab (Previously)  no Current work status  working full time Living situation  live with spouse Marital status  married Number of flights of stairs before winded  4-5 Illicit drug use  no Pain Contract  no Tobacco / smoke exposure  no Post-Surgical Plans  Home With Caregiver, Home, Straight to Outpatient Therapy.  Medication History Clobetasol Propionate (0.05% Cream, External as needed) Active. Diclofenac Sodium (75MG  Tablet DR, Oral) Active. Atorvastatin Calcium (20MG  Tablet, Oral) Active. Multivitamin (Oral) Specific strength unknown - Active.   Past Surgical History Hysterectomy  partial (non-cancerous) Tonsillectomy  Bone Spur Removal Right Heel  Date: 08/2014.    Review of Systems General Not Present- Chills, Fatigue, Fever, Memory Loss, Night Sweats, Weight Gain and Weight Loss. Skin Present- Psoriasis. Not Present- Eczema, Hives, Itching, Lesions and Rash. HEENT Not Present- Dentures, Double Vision, Headache, Hearing Loss, Tinnitus and  Visual  Loss. Respiratory Not Present- Allergies, Chronic Cough, Coughing up blood, Shortness of breath at rest and Shortness of breath with exertion. Cardiovascular Not Present- Chest Pain, Difficulty Breathing Lying Down, Murmur, Palpitations, Racing/skipping heartbeats and Swelling. Gastrointestinal Not Present- Abdominal Pain, Bloody Stool, Constipation, Diarrhea, Difficulty Swallowing, Heartburn, Jaundice, Loss of appetitie, Nausea and Vomiting. Female Genitourinary Not Present- Blood in Urine, Discharge, Flank Pain, Incontinence, Painful Urination, Urgency, Urinary frequency, Urinary Retention, Urinating at Night and Weak urinary stream. Musculoskeletal Present- Joint Pain. Not Present- Back Pain, Joint Swelling, Morning Stiffness, Muscle Pain, Muscle Weakness and Spasms. Neurological Not Present- Blackout spells, Difficulty with balance, Dizziness, Paralysis, Tremor and Weakness. Psychiatric Not Present- Insomnia.  Vitals Weight: 190 lb Height: 68in Weight was reported by patient. Height was reported by patient. Body Surface Area: 2 m Body Mass Index: 28.89 kg/m  Pulse: 60 (Regular)  BP: 132/74 (Sitting, Right Arm, Standard)   Physical Exam General Mental Status -Alert, cooperative and good historian. General Appearance-pleasant, Not in acute distress. Orientation-Oriented X3. Build & Nutrition-Well nourished and Well developed.  Head and Neck Head-normocephalic, atraumatic . Neck Global Assessment - supple, no bruit auscultated on the right, no bruit auscultated on the left.  Eye Pupil - Bilateral-Regular and Round. Motion - Bilateral-EOMI.  Chest and Lung Exam Auscultation Breath sounds - clear at anterior chest wall and clear at posterior chest wall. Adventitious sounds - No Adventitious sounds.  Cardiovascular Auscultation Rhythm - Regular rate and rhythm. Heart Sounds - S1 WNL and S2 WNL. Murmurs & Other Heart Sounds - Auscultation of the heart  reveals - No Murmurs.  Abdomen Palpation/Percussion Tenderness - Abdomen is non-tender to palpation. Rigidity (guarding) - Abdomen is soft. Auscultation Auscultation of the abdomen reveals - Bowel sounds normal.  Female Genitourinary Note: Not done, not pertinent to present illness   Musculoskeletal Note: Examination of the right hip shows flexion to 120 rotation in 30 abduction 40 and external rotation of 40. There is no tenderness over the greater trochanter. There is no pain on provocative testing of the hip.Evaluation of the left hip shows flexion to 120 rotation in 30 out 40 and abduction 40 without discomfort. There is no tenderness over the greater trochanter. There is no pain on provocative testing of the hip. Her RIGHT knee shows no effusion. Range of motion is 0-125. There is some crepitus on range of motion. There is no tenderness or instability. LEFT knee varus deformity. Is no effusion. Range 5-120. Marked crepitus on range of motion with tender medial grade and lateral no instability. Full sensation motor intact both lower extremities. Radiographs are reviewed AP and lateral of the knee showing that she has bone-on-bone arthritis in the medial and patellofemoral compartments of the LEFT knee. Please add that her gait pattern is significantly antalgic on the LEFT.   Assessment & Plan Primary osteoarthritis of left knee (M17.12)  Note:Surgical Plans: Left Total Knee Replacement  Disposition: Home with husband, Straight to outpatient therapy at [facility]  PCP: Dr. Edilia Bo - pending  IV TXA  Anesthesia Issues: None  Patient was instructed on what medications to stop prior to surgery.  Patient has walker and crutches at home already.  Signed electronically by Joelene Millin, III PA-C

## 2017-09-04 DIAGNOSIS — D225 Melanocytic nevi of trunk: Secondary | ICD-10-CM | POA: Diagnosis not present

## 2017-09-04 DIAGNOSIS — L821 Other seborrheic keratosis: Secondary | ICD-10-CM | POA: Diagnosis not present

## 2017-09-04 DIAGNOSIS — L570 Actinic keratosis: Secondary | ICD-10-CM | POA: Diagnosis not present

## 2017-09-04 DIAGNOSIS — D1801 Hemangioma of skin and subcutaneous tissue: Secondary | ICD-10-CM | POA: Diagnosis not present

## 2017-09-05 NOTE — Patient Instructions (Signed)
Angela Moses  09/05/2017   Your procedure is scheduled on: 09-11-17  Report to [facility] Main  Entrance Report to Admitting at 6:55 AM   Call this number if you have problems the morning of surgery [phone]   Remember: ONLY 1 PERSON MAY GO WITH YOU TO SHORT STAY TO GET  READY MORNING OF [place].  Do not eat food or drink liquids :After Midnight.     Take these medicines the morning of surgery with A SIP OF WATER: None                                You may not have any metal on your body including hair pins and              piercings  Do not wear jewelry, make-up, lotions, powders or perfumes, deodorant             Do not wear nail polish.  Do not shave  48 hours prior to surgery.                Do not bring valuables to the hospital. [place].  Contacts, dentures or bridgework may not be worn into surgery.  Leave suitcase in the car. After surgery it may be brought to your room.                  Please read over the following fact sheets you were given: _____________________________________________________________________             [facility] - Preparing for Surgery Before surgery, you can play an important role.  Because skin is not sterile, your skin needs to be as free of germs as possible.  You can reduce the number of germs on your skin by washing with CHG (chlorahexidine gluconate) soap before surgery.  CHG is an antiseptic cleaner which kills germs and bonds with the skin to continue killing germs even after washing. Please DO NOT use if you have an allergy to CHG or antibacterial soaps.  If your skin becomes reddened/irritated stop using the CHG and inform your nurse when you arrive at Short Stay. Do not shave (including legs and underarms) for at least 48 hours prior to the first CHG shower.  You may shave your face/neck. Please follow these instructions carefully:  1.  Shower  with CHG Soap the night before surgery and the  morning of Surgery.  2.  If you choose to wash your hair, wash your hair first as usual with your  normal  shampoo.  3.  After you shampoo, rinse your hair and body thoroughly to remove the  shampoo.                           4.  Use CHG as you would any other liquid soap.  You can apply chg directly  to the skin and wash                       Gently with a scrungie or clean washcloth.  5.  Apply the CHG Soap to your body ONLY FROM THE NECK DOWN.   Do not use  on face/ open                           Wound or open sores. Avoid contact with eyes, ears mouth and genitals (private parts).                       Wash face,  Genitals (private parts) with your normal soap.             6.  Wash thoroughly, paying special attention to the area where your surgery  will be performed.  7.  Thoroughly rinse your body with warm water from the neck down.  8.  DO NOT shower/wash with your normal soap after using and rinsing off  the CHG Soap.                9.  Pat yourself dry with a clean towel.            10.  Wear clean pajamas.            11.  Place clean sheets on your bed the night of your first shower and do not  sleep with pets. Day of Surgery : Do not apply any lotions/deodorants the morning of surgery.  Please wear clean clothes to the hospital/surgery center.  FAILURE TO FOLLOW THESE INSTRUCTIONS MAY RESULT IN THE CANCELLATION OF YOUR SURGERY PATIENT SIGNATURE_________________________________  NURSE SIGNATURE__________________________________  ________________________________________________________________________   Angela Moses  An incentive spirometer is a tool that can help keep your lungs clear and active. This tool measures how well you are filling your lungs with each breath. Taking long deep breaths may help reverse or decrease the chance of developing breathing (pulmonary) problems (especially infection) following:  A long period  of time when you are unable to move or be active. BEFORE THE PROCEDURE   If the spirometer includes an indicator to show your best effort, your nurse or respiratory therapist will set it to a desired goal.  If possible, sit up straight or lean slightly forward. Try not to slouch.  Hold the incentive spirometer in an upright position. INSTRUCTIONS FOR USE  1. Sit on the edge of your bed if possible, or sit up as far as you can in bed or on a chair. 2. Hold the incentive spirometer in an upright position. 3. Breathe out normally. 4. Place the mouthpiece in your mouth and seal your lips tightly around it. 5. Breathe in slowly and as deeply as possible, raising the piston or the ball toward the top of the column. 6. Hold your breath for 3-5 seconds or for as long as possible. Allow the piston or ball to fall to the bottom of the column. 7. Remove the mouthpiece from your mouth and breathe out normally. 8. Rest for a few seconds and repeat Steps 1 through 7 at least 10 times every 1-2 hours when you are awake. Take your time and take a few normal breaths between deep breaths. 9. The spirometer may include an indicator to show your best effort. Use the indicator as a goal to work toward during each repetition. 10. After each set of 10 deep breaths, practice coughing to be sure your lungs are clear. If you have an incision (the cut made at the time of surgery), support your incision when coughing by placing a pillow or rolled up towels firmly against it. Once you are able to get out of bed, walk around  indoors and cough well. You may stop using the incentive spirometer when instructed by your caregiver.  RISKS AND COMPLICATIONS  Take your time so you do not get dizzy or light-headed.  If you are in pain, you may need to take or ask for pain medication before doing incentive spirometry. It is harder to take a deep breath if you are having pain. AFTER USE  Rest and breathe slowly and easily.  It  can be helpful to keep track of a log of your progress. Your caregiver can provide you with a simple table to help with this. If you are using the spirometer at home, follow these instructions: [place] IF:   You are having difficultly using the spirometer.  You have trouble using the spirometer as often as instructed.  Your pain medication is not giving enough relief while using the spirometer.  You develop fever of 100.5 F (38.1 C) or higher. SEEK IMMEDIATE MEDICAL CARE IF:   You cough up bloody sputum that had not been present before.  You develop fever of 102 F (38.9 C) or greater.  You develop worsening pain at or near the incision site. MAKE SURE YOU:   Understand these instructions.  Will watch your condition.  Will get help right away if you are not doing well or get worse. Document Released: 02/06/2007 Document Revised: 12/19/2011 Document Reviewed: 04/09/2007 ExitCare Patient Information 2014 ExitCare, [place].   ________________________________________________________________________  WHAT IS A BLOOD TRANSFUSION? Blood Transfusion Information  A transfusion is the replacement of blood or some of its parts. Blood is made up of multiple cells which provide different functions.  Red blood cells carry oxygen and are used for blood loss replacement.  White blood cells fight against infection.  Platelets control bleeding.  Plasma helps clot blood.  Other blood products are available for specialized needs, such as hemophilia or other clotting disorders. BEFORE THE TRANSFUSION  Who gives blood for transfusions?   Healthy volunteers who are fully evaluated to make sure their blood is safe. This is blood bank blood. Transfusion therapy is the safest it has ever been in the practice of medicine. Before blood is taken from a donor, a complete history is taken to make sure that person has no history of diseases nor engages in risky social behavior (examples  are intravenous drug use or sexual activity with multiple partners). The donor's travel history is screened to minimize risk of transmitting infections, such as malaria. The donated blood is tested for signs of infectious diseases, such as HIV and hepatitis. The blood is then tested to be sure it is compatible with you in order to minimize the chance of a transfusion reaction. If you or a relative donates blood, this is often done in anticipation of surgery and is not appropriate for emergency situations. It takes many days to process the donated blood. RISKS AND COMPLICATIONS Although transfusion therapy is very safe and saves many lives, the main dangers of transfusion include:   Getting an infectious disease.  Developing a transfusion reaction. This is an allergic reaction to something in the blood you were given. Every precaution is taken to prevent this. The decision to have a blood transfusion has been considered carefully by your caregiver before blood is given. Blood is not given unless the benefits outweigh the risks. AFTER THE TRANSFUSION  Right after receiving a blood transfusion, you will usually feel much better and more energetic. This is especially true if your red blood cells have gotten  low (anemic). The transfusion raises the level of the red blood cells which carry oxygen, and this usually causes an energy increase.  The nurse administering the transfusion will monitor you carefully for complications. HOME CARE INSTRUCTIONS  No special instructions are needed after a transfusion. You may find your energy is better. Speak with your caregiver about any limitations on activity for underlying diseases you may have. SEEK MEDICAL CARE IF:   Your condition is not improving after your transfusion.  You develop redness or irritation at the intravenous (IV) site. SEEK IMMEDIATE MEDICAL CARE IF:  Any of the following symptoms occur over the next 12 hours:  Shaking chills.  You have a  temperature by mouth above 102 F (38.9 C), not controlled by medicine.  Chest, back, or muscle pain.  People around you feel you are not acting correctly or are confused.  Shortness of breath or difficulty breathing.  Dizziness and fainting.  You get a rash or develop hives.  You have a decrease in urine output.  Your urine turns a dark color or changes to pink, red, or brown. Any of the following symptoms occur over the next 10 days:  You have a temperature by mouth above 102 F (38.9 C), not controlled by medicine.  Shortness of breath.  Weakness after normal activity.  The white part of the eye turns yellow (jaundice).  You have a decrease in the amount of urine or are urinating less often.  Your urine turns a dark color or changes to pink, red, or brown. Document Released: 09/23/2000 Document Revised: 12/19/2011 Document Reviewed: 05/12/2008 [facility] Patient Information 2014 [place], [place].  _______________________________________________________________________

## 2017-09-05 NOTE — Progress Notes (Signed)
08-23-17 (Epic) Surgical Clearance from Dr. Lorelei Pont and EKG

## 2017-09-06 ENCOUNTER — Encounter (HOSPITAL_COMMUNITY)
Admission: RE | Admit: 2017-09-06 | Discharge: 2017-09-06 | Disposition: A | Discharge status: Home / Self Care | Payer: 59 | Source: Ambulatory Visit | Attending: Orthopedic Surgery | Admitting: Orthopedic Surgery

## 2017-09-06 ENCOUNTER — Encounter (HOSPITAL_COMMUNITY): Payer: Self-pay

## 2017-09-06 ENCOUNTER — Other Ambulatory Visit: Payer: Self-pay

## 2017-09-06 DIAGNOSIS — Z01812 Encounter for preprocedural laboratory examination: Secondary | ICD-10-CM | POA: Diagnosis not present

## 2017-09-06 DIAGNOSIS — M1712 Unilateral primary osteoarthritis, left knee: Secondary | ICD-10-CM | POA: Diagnosis not present

## 2017-09-06 LAB — CBC
HCT: 41.4 % (ref 36.0–46.0)
HEMOGLOBIN: 13.6 g/dL (ref 12.0–15.0)
MCH: 30.3 pg (ref 26.0–34.0)
MCHC: 32.9 g/dL (ref 30.0–36.0)
MCV: 92.2 fL (ref 78.0–100.0)
PLATELETS: 226 10*3/uL (ref 150–400)
RBC: 4.49 MIL/uL (ref 3.87–5.11)
RDW: 13.8 % (ref 11.5–15.5)
WBC: 6.9 10*3/uL (ref 4.0–10.5)

## 2017-09-06 LAB — COMPREHENSIVE METABOLIC PANEL
ALBUMIN: 3.8 g/dL (ref 3.5–5.0)
ALK PHOS: 90 U/L (ref 38–126)
ALT: 21 U/L (ref 14–54)
ANION GAP: 6 (ref 5–15)
AST: 20 U/L (ref 15–41)
BUN: 16 mg/dL (ref 6–20)
CALCIUM: 9.2 mg/dL (ref 8.9–10.3)
CHLORIDE: 105 mmol/L (ref 101–111)
CO2: 29 mmol/L (ref 22–32)
Creatinine, Ser: 0.96 mg/dL (ref 0.44–1.00)
GFR calc non Af Amer: >60 mL/min (ref >60)
GLUCOSE: 88 mg/dL (ref 65–99)
Potassium: 3.5 mmol/L (ref 3.5–5.1)
SODIUM: 140 mmol/L (ref 135–145)
Total Bilirubin: 0.7 mg/dL (ref 0.3–1.2)
Total Protein: 6.6 g/dL (ref 6.5–8.1)

## 2017-09-06 LAB — SURGICAL PCR SCREEN
MRSA, PCR: NEGATIVE
Staphylococcus aureus: POSITIVE — AB

## 2017-09-06 LAB — PROTIME-INR
INR: 1
Prothrombin Time: 13.1 seconds (ref 11.4–15.2)

## 2017-09-06 LAB — APTT: APTT: 31 s (ref 24–36)

## 2017-09-07 LAB — ABO/RH: ABO/RH(D): A POS

## 2017-09-11 ENCOUNTER — Observation Stay (HOSPITAL_COMMUNITY)
Admission: AD | Admit: 2017-09-11 | Discharge: 2017-09-13 | Disposition: A | Discharge status: Home / Self Care | Payer: 59 | Source: Ambulatory Visit | Attending: Orthopedic Surgery | Admitting: Orthopedic Surgery

## 2017-09-11 ENCOUNTER — Ambulatory Visit (HOSPITAL_COMMUNITY): Payer: 59 | Admitting: Anesthesiology

## 2017-09-11 ENCOUNTER — Other Ambulatory Visit: Payer: Self-pay

## 2017-09-11 ENCOUNTER — Encounter (HOSPITAL_COMMUNITY)
Admission: AD | Disposition: A | Discharge status: Home / Self Care | Payer: Self-pay | Source: Ambulatory Visit | Attending: Orthopedic Surgery

## 2017-09-11 ENCOUNTER — Encounter (HOSPITAL_COMMUNITY): Payer: Self-pay | Admitting: *Deleted

## 2017-09-11 DIAGNOSIS — Z791 Long term (current) use of non-steroidal anti-inflammatories (NSAID): Secondary | ICD-10-CM | POA: Diagnosis not present

## 2017-09-11 DIAGNOSIS — E785 Hyperlipidemia, unspecified: Secondary | ICD-10-CM | POA: Insufficient documentation

## 2017-09-11 DIAGNOSIS — L409 Psoriasis, unspecified: Secondary | ICD-10-CM | POA: Insufficient documentation

## 2017-09-11 DIAGNOSIS — M1712 Unilateral primary osteoarthritis, left knee: Principal | ICD-10-CM | POA: Insufficient documentation

## 2017-09-11 DIAGNOSIS — M1710 Unilateral primary osteoarthritis, unspecified knee: Secondary | ICD-10-CM | POA: Diagnosis present

## 2017-09-11 DIAGNOSIS — Z79899 Other long term (current) drug therapy: Secondary | ICD-10-CM | POA: Diagnosis not present

## 2017-09-11 DIAGNOSIS — M179 Osteoarthritis of knee, unspecified: Secondary | ICD-10-CM | POA: Diagnosis present

## 2017-09-11 DIAGNOSIS — M542 Cervicalgia: Secondary | ICD-10-CM | POA: Diagnosis not present

## 2017-09-11 DIAGNOSIS — M170 Bilateral primary osteoarthritis of knee: Secondary | ICD-10-CM | POA: Diagnosis present

## 2017-09-11 DIAGNOSIS — M25762 Osteophyte, left knee: Secondary | ICD-10-CM | POA: Diagnosis not present

## 2017-09-11 DIAGNOSIS — M25562 Pain in left knee: Secondary | ICD-10-CM | POA: Diagnosis present

## 2017-09-11 DIAGNOSIS — G8918 Other acute postprocedural pain: Secondary | ICD-10-CM | POA: Diagnosis not present

## 2017-09-11 HISTORY — PX: TOTAL KNEE ARTHROPLASTY: SHX125

## 2017-09-11 LAB — TYPE AND SCREEN
ABO/RH(D): A POS
ANTIBODY SCREEN: NEGATIVE

## 2017-09-11 SURGERY — ARTHROPLASTY, KNEE, TOTAL
Anesthesia: Spinal | Site: Knee | Laterality: Left

## 2017-09-11 MED ORDER — SODIUM CHLORIDE 0.9 % IR SOLN
Status: DC | PRN
  Administered 2017-09-11: 1000 mL

## 2017-09-11 MED ORDER — MEPERIDINE HCL 50 MG/ML IJ SOLN: 6.2500 mg | INTRAMUSCULAR | Status: DC | PRN

## 2017-09-11 MED ORDER — BUPIVACAINE LIPOSOME 1.3 % IJ SUSP
INTRAMUSCULAR | Status: DC | PRN
  Administered 2017-09-11: 20 mL

## 2017-09-11 MED ORDER — MENTHOL 3 MG MT LOZG: 1.0000 | LOZENGE | OROMUCOSAL | Status: DC | PRN

## 2017-09-11 MED ORDER — ONDANSETRON HCL 4 MG PO TABS
4.0000 mg | ORAL_TABLET | Freq: Four times a day (QID) | ORAL | Status: DC | PRN
  Filled 2017-09-11: qty 1

## 2017-09-11 MED ORDER — ACETAMINOPHEN 10 MG/ML IV SOLN
1000.0000 mg | Freq: Once | INTRAVENOUS | Status: AC
  Administered 2017-09-11: 1000 mg via INTRAVENOUS

## 2017-09-11 MED ORDER — PROPOFOL 500 MG/50ML IV EMUL
INTRAVENOUS | Status: DC | PRN
  Administered 2017-09-11: 100 ug/kg/min via INTRAVENOUS

## 2017-09-11 MED ORDER — ONDANSETRON HCL 4 MG/2ML IJ SOLN
INTRAMUSCULAR | Status: DC | PRN
  Administered 2017-09-11: 4 mg via INTRAVENOUS

## 2017-09-11 MED ORDER — PROMETHAZINE HCL 25 MG/ML IJ SOLN: 6.2500 mg | INTRAMUSCULAR | Status: DC | PRN

## 2017-09-11 MED ORDER — BISACODYL 10 MG RE SUPP
10.0000 mg | Freq: Every day | RECTAL | Status: DC | PRN
  Filled 2017-09-11: qty 1

## 2017-09-11 MED ORDER — PROPOFOL 10 MG/ML IV BOLUS
INTRAVENOUS | Status: AC
  Filled 2017-09-11: qty 40

## 2017-09-11 MED ORDER — OXYCODONE HCL 5 MG PO TABS
10.0000 mg | ORAL_TABLET | ORAL | Status: DC | PRN
  Administered 2017-09-11 – 2017-09-13 (×9): 10 mg via ORAL
  Filled 2017-09-11 (×9): qty 2

## 2017-09-11 MED ORDER — DEXAMETHASONE SODIUM PHOSPHATE 10 MG/ML IJ SOLN
10.0000 mg | Freq: Once | INTRAMUSCULAR | Status: AC
  Administered 2017-09-11: 10 mg via INTRAVENOUS

## 2017-09-11 MED ORDER — GABAPENTIN 300 MG PO CAPS
300.0000 mg | ORAL_CAPSULE | Freq: Once | ORAL | Status: AC
  Administered 2017-09-11: 300 mg via ORAL

## 2017-09-11 MED ORDER — ACETAMINOPHEN 500 MG PO TABS
1000.0000 mg | ORAL_TABLET | Freq: Four times a day (QID) | ORAL | Status: AC
  Administered 2017-09-11 – 2017-09-12 (×4): 1000 mg via ORAL
  Filled 2017-09-11 (×4): qty 2

## 2017-09-11 MED ORDER — GABAPENTIN 300 MG PO CAPS
ORAL_CAPSULE | ORAL | Status: AC
  Administered 2017-09-11: 300 mg via ORAL
  Filled 2017-09-11: qty 1

## 2017-09-11 MED ORDER — CHLORHEXIDINE GLUCONATE 4 % EX LIQD: 60.0000 mL | Freq: Once | CUTANEOUS | Status: DC

## 2017-09-11 MED ORDER — SODIUM CHLORIDE 0.9 % IJ SOLN
INTRAMUSCULAR | Status: AC
Start: 2017-09-11 — End: ?
  Filled 2017-09-11: qty 10

## 2017-09-11 MED ORDER — ACETAMINOPHEN 10 MG/ML IV SOLN
INTRAVENOUS | Status: AC
  Filled 2017-09-11: qty 100

## 2017-09-11 MED ORDER — CEFAZOLIN SODIUM-DEXTROSE 2-4 GM/100ML-% IV SOLN
2.0000 g | Freq: Four times a day (QID) | INTRAVENOUS | Status: AC
  Administered 2017-09-11 (×2): 2 g via INTRAVENOUS
  Filled 2017-09-11 (×2): qty 100

## 2017-09-11 MED ORDER — METOCLOPRAMIDE HCL 5 MG/ML IJ SOLN: 5.0000 mg | Freq: Three times a day (TID) | INTRAMUSCULAR | Status: DC | PRN

## 2017-09-11 MED ORDER — FENTANYL CITRATE (PF) 100 MCG/2ML IJ SOLN
INTRAMUSCULAR | Status: AC
  Administered 2017-09-11: 50 ug via INTRAVENOUS
  Filled 2017-09-11: qty 2

## 2017-09-11 MED ORDER — ONDANSETRON HCL 4 MG/2ML IJ SOLN: 4.0000 mg | Freq: Four times a day (QID) | INTRAMUSCULAR | Status: DC | PRN

## 2017-09-11 MED ORDER — OXYCODONE HCL 5 MG PO TABS
5.0000 mg | ORAL_TABLET | ORAL | Status: DC | PRN
  Administered 2017-09-11: 5 mg via ORAL
  Filled 2017-09-11 (×2): qty 1

## 2017-09-11 MED ORDER — GABAPENTIN 300 MG PO CAPS
ORAL_CAPSULE | ORAL | Status: AC
  Filled 2017-09-11: qty 1

## 2017-09-11 MED ORDER — LACTATED RINGERS IV SOLN: INTRAVENOUS | Status: DC

## 2017-09-11 MED ORDER — GABAPENTIN 300 MG PO CAPS
300.0000 mg | ORAL_CAPSULE | Freq: Three times a day (TID) | ORAL | Status: DC
  Administered 2017-09-11 – 2017-09-13 (×6): 300 mg via ORAL
  Filled 2017-09-11 (×5): qty 1

## 2017-09-11 MED ORDER — RIVAROXABAN 10 MG PO TABS
10.0000 mg | ORAL_TABLET | Freq: Every day | ORAL | Status: DC
  Administered 2017-09-12 – 2017-09-13 (×2): 10 mg via ORAL
  Filled 2017-09-11 (×2): qty 1

## 2017-09-11 MED ORDER — DIPHENHYDRAMINE HCL 12.5 MG/5ML PO ELIX
12.5000 mg | ORAL_SOLUTION | ORAL | Status: DC | PRN
  Filled 2017-09-11: qty 10

## 2017-09-11 MED ORDER — DEXAMETHASONE SODIUM PHOSPHATE 10 MG/ML IJ SOLN
10.0000 mg | Freq: Once | INTRAMUSCULAR | Status: AC
  Administered 2017-09-12: 10 mg via INTRAVENOUS
  Filled 2017-09-11: qty 1

## 2017-09-11 MED ORDER — ONDANSETRON HCL 4 MG/2ML IJ SOLN
INTRAMUSCULAR | Status: AC
Start: 2017-09-11 — End: ?
  Filled 2017-09-11: qty 2

## 2017-09-11 MED ORDER — HYDROMORPHONE HCL 1 MG/ML IJ SOLN
0.2500 mg | INTRAMUSCULAR | Status: DC | PRN
Start: 2017-09-11 — End: 2017-09-11

## 2017-09-11 MED ORDER — METHOCARBAMOL 1000 MG/10ML IJ SOLN
500.0000 mg | Freq: Four times a day (QID) | INTRAVENOUS | Status: DC | PRN
  Administered 2017-09-11: 500 mg via INTRAVENOUS
  Filled 2017-09-11: qty 550

## 2017-09-11 MED ORDER — ACETAMINOPHEN 325 MG PO TABS: 650.0000 mg | ORAL_TABLET | ORAL | Status: DC | PRN

## 2017-09-11 MED ORDER — LACTATED RINGERS IV SOLN
INTRAVENOUS | Status: DC
  Administered 2017-09-11 (×2): via INTRAVENOUS

## 2017-09-11 MED ORDER — POLYETHYLENE GLYCOL 3350 17 G PO PACK
17.0000 g | PACK | Freq: Every day | ORAL | Status: DC | PRN
  Filled 2017-09-11: qty 1

## 2017-09-11 MED ORDER — FENTANYL CITRATE (PF) 100 MCG/2ML IJ SOLN
50.0000 ug | INTRAMUSCULAR | Status: DC
  Administered 2017-09-11 (×2): 50 ug via INTRAVENOUS

## 2017-09-11 MED ORDER — ACETAMINOPHEN 650 MG RE SUPP
650.0000 mg | RECTAL | Status: DC | PRN
  Filled 2017-09-11: qty 1

## 2017-09-11 MED ORDER — ROPIVACAINE HCL 7.5 MG/ML IJ SOLN
INTRAMUSCULAR | Status: DC | PRN
  Administered 2017-09-11: 20 mL via PERINEURAL

## 2017-09-11 MED ORDER — METOCLOPRAMIDE HCL 5 MG PO TABS
5.0000 mg | ORAL_TABLET | Freq: Three times a day (TID) | ORAL | Status: DC | PRN
Start: 2017-09-11 — End: 2017-09-13
  Filled 2017-09-11: qty 2

## 2017-09-11 MED ORDER — CEFAZOLIN SODIUM-DEXTROSE 2-4 GM/100ML-% IV SOLN
INTRAVENOUS | Status: AC
  Filled 2017-09-11: qty 100

## 2017-09-11 MED ORDER — CEFAZOLIN SODIUM-DEXTROSE 2-4 GM/100ML-% IV SOLN
2.0000 g | INTRAVENOUS | Status: AC
  Administered 2017-09-11: 2 g via INTRAVENOUS

## 2017-09-11 MED ORDER — BUPIVACAINE IN DEXTROSE 0.75-8.25 % IT SOLN
INTRATHECAL | Status: DC | PRN
  Administered 2017-09-11: 15 mg via INTRATHECAL

## 2017-09-11 MED ORDER — ATORVASTATIN CALCIUM 20 MG PO TABS
20.0000 mg | ORAL_TABLET | Freq: Every day | ORAL | Status: DC
  Administered 2017-09-13: 20 mg via ORAL
  Filled 2017-09-11 (×3): qty 1

## 2017-09-11 MED ORDER — TRANEXAMIC ACID 1000 MG/10ML IV SOLN
1000.0000 mg | INTRAVENOUS | Status: AC
  Administered 2017-09-11: 1000 mg via INTRAVENOUS
  Filled 2017-09-11: qty 1100

## 2017-09-11 MED ORDER — SODIUM CHLORIDE 0.9 % IV SOLN
INTRAVENOUS | Status: DC
  Administered 2017-09-11: 13:00:00 via INTRAVENOUS

## 2017-09-11 MED ORDER — HYDROMORPHONE HCL 1 MG/ML IJ SOLN: 0.2500 mg | INTRAMUSCULAR | Status: DC | PRN

## 2017-09-11 MED ORDER — METHOCARBAMOL 500 MG PO TABS
500.0000 mg | ORAL_TABLET | Freq: Four times a day (QID) | ORAL | Status: DC | PRN
  Administered 2017-09-11: 500 mg via ORAL
  Filled 2017-09-11: qty 1

## 2017-09-11 MED ORDER — TRAMADOL HCL 50 MG PO TABS: 50.0000 mg | ORAL_TABLET | Freq: Four times a day (QID) | ORAL | Status: DC | PRN

## 2017-09-11 MED ORDER — SODIUM CHLORIDE 0.9 % IJ SOLN
INTRAMUSCULAR | Status: DC | PRN
  Administered 2017-09-11: 60 mL

## 2017-09-11 MED ORDER — MORPHINE SULFATE (PF) 4 MG/ML IV SOLN
1.0000 mg | INTRAVENOUS | Status: DC | PRN
  Administered 2017-09-11: 1 mg via INTRAVENOUS
  Filled 2017-09-11: qty 1

## 2017-09-11 MED ORDER — PHENOL 1.4 % MT LIQD: 1.0000 | OROMUCOSAL | Status: DC | PRN

## 2017-09-11 MED ORDER — MIDAZOLAM HCL 2 MG/2ML IJ SOLN
1.0000 mg | INTRAMUSCULAR | Status: DC
Start: 2017-09-11 — End: 2017-09-11
  Administered 2017-09-11: 2 mg via INTRAVENOUS

## 2017-09-11 MED ORDER — MIDAZOLAM HCL 2 MG/2ML IJ SOLN
INTRAMUSCULAR | Status: AC
  Administered 2017-09-11: 2 mg via INTRAVENOUS
  Filled 2017-09-11: qty 2

## 2017-09-11 MED ORDER — TRANEXAMIC ACID 1000 MG/10ML IV SOLN
1000.0000 mg | Freq: Once | INTRAVENOUS | Status: AC
  Administered 2017-09-11: 1000 mg via INTRAVENOUS
  Filled 2017-09-11: qty 1100

## 2017-09-11 MED ORDER — BUPIVACAINE LIPOSOME 1.3 % IJ SUSP
20.0000 mL | Freq: Once | INTRAMUSCULAR | Status: DC
  Filled 2017-09-11: qty 20

## 2017-09-11 MED ORDER — SODIUM CHLORIDE 0.9 % IJ SOLN
INTRAMUSCULAR | Status: AC
Start: 2017-09-11 — End: ?
  Filled 2017-09-11: qty 50

## 2017-09-11 MED ORDER — DEXAMETHASONE SODIUM PHOSPHATE 10 MG/ML IJ SOLN
INTRAMUSCULAR | Status: AC
  Filled 2017-09-11: qty 1

## 2017-09-11 MED ORDER — FLEET ENEMA 7-19 GM/118ML RE ENEM
1.0000 | ENEMA | Freq: Once | RECTAL | Status: DC | PRN
  Filled 2017-09-11: qty 1

## 2017-09-11 MED ORDER — DOCUSATE SODIUM 100 MG PO CAPS
100.0000 mg | ORAL_CAPSULE | Freq: Two times a day (BID) | ORAL | Status: DC
  Administered 2017-09-11 – 2017-09-13 (×5): 100 mg via ORAL
  Filled 2017-09-11 (×5): qty 1

## 2017-09-11 SURGICAL SUPPLY — 50 items
BAG DECANTER FOR FLEXI CONT (MISCELLANEOUS) ×2 IMPLANT
BAG SPEC THK2 15X12 ZIP CLS (MISCELLANEOUS) ×1
BAG ZIPLOCK 12X15 (MISCELLANEOUS) ×2 IMPLANT
BANDAGE ACE 6X5 VEL STRL LF (GAUZE/BANDAGES/DRESSINGS) ×2 IMPLANT
BLADE SAG 18X100X1.27 (BLADE) ×2 IMPLANT
BLADE SAW SGTL 11.0X1.19X90.0M (BLADE) ×2 IMPLANT
BOWL SMART MIX CTS (DISPOSABLE) ×2 IMPLANT
CAPT KNEE TOTAL 3 ATTUNE ×1 IMPLANT
CEMENT HV SMART SET (Cement) ×4 IMPLANT
COVER SURGICAL LIGHT HANDLE (MISCELLANEOUS) ×2 IMPLANT
CUFF TOURN SGL QUICK 34 (TOURNIQUET CUFF) ×2
CUFF TRNQT CYL 34X4X40X1 (TOURNIQUET CUFF) ×1 IMPLANT
DECANTER SPIKE VIAL GLASS SM (MISCELLANEOUS) ×2 IMPLANT
DRAPE U-SHAPE 47X51 STRL (DRAPES) ×2 IMPLANT
DRSG ADAPTIC 3X8 NADH LF (GAUZE/BANDAGES/DRESSINGS) ×2 IMPLANT
DRSG PAD ABDOMINAL 8X10 ST (GAUZE/BANDAGES/DRESSINGS) ×2 IMPLANT
DURAPREP 26ML APPLICATOR (WOUND CARE) ×2 IMPLANT
ELECT REM PT RETURN 15FT ADLT (MISCELLANEOUS) ×2 IMPLANT
EVACUATOR 1/8 PVC DRAIN (DRAIN) ×2 IMPLANT
GAUZE SPONGE 4X4 12PLY STRL (GAUZE/BANDAGES/DRESSINGS) ×2 IMPLANT
GLOVE BIO SURGEON STRL SZ7.5 (GLOVE) IMPLANT
GLOVE BIO SURGEON STRL SZ8 (GLOVE) ×2 IMPLANT
GLOVE BIOGEL PI IND STRL 6.5 (GLOVE) IMPLANT
GLOVE BIOGEL PI IND STRL 8 (GLOVE) ×1 IMPLANT
GLOVE BIOGEL PI INDICATOR 6.5 (GLOVE)
GLOVE BIOGEL PI INDICATOR 8 (GLOVE) ×1
GLOVE SURG SS PI 6.5 STRL IVOR (GLOVE) ×6 IMPLANT
GOWN STRL REUS W/TWL LRG LVL3 (GOWN DISPOSABLE) ×4 IMPLANT
GOWN STRL REUS W/TWL XL LVL3 (GOWN DISPOSABLE) ×2 IMPLANT
HANDPIECE INTERPULSE COAX TIP (DISPOSABLE) ×2
IMMOBILIZER KNEE 20 (SOFTGOODS) ×2
IMMOBILIZER KNEE 20 THIGH 36 (SOFTGOODS) ×1 IMPLANT
MANIFOLD NEPTUNE II (INSTRUMENTS) ×2 IMPLANT
NS IRRIG 1000ML POUR BTL (IV SOLUTION) ×2 IMPLANT
PACK TOTAL KNEE CUSTOM (KITS) ×2 IMPLANT
PADDING CAST COTTON 6X4 STRL (CAST SUPPLIES) ×4 IMPLANT
POSITIONER SURGICAL ARM (MISCELLANEOUS) ×2 IMPLANT
SET HNDPC FAN SPRY TIP SCT (DISPOSABLE) ×1 IMPLANT
STRIP CLOSURE SKIN 1/2X4 (GAUZE/BANDAGES/DRESSINGS) ×3 IMPLANT
SUT MNCRL AB 4-0 PS2 18 (SUTURE) ×2 IMPLANT
SUT STRATAFIX 0 PDS 27 VIOLET (SUTURE) ×2
SUT VIC AB 2-0 CT1 27 (SUTURE) ×6
SUT VIC AB 2-0 CT1 TAPERPNT 27 (SUTURE) ×3 IMPLANT
SUTURE STRATFX 0 PDS 27 VIOLET (SUTURE) ×1 IMPLANT
SYR 30ML LL (SYRINGE) ×4 IMPLANT
TRAY FOLEY BAG SILVER LF 16FR (CATHETERS) ×1 IMPLANT
TRAY FOLEY W/METER SILVER 16FR (SET/KITS/TRAYS/PACK) ×2 IMPLANT
WATER STERILE IRR 1000ML POUR (IV SOLUTION) ×4 IMPLANT
WRAP KNEE MAXI GEL POST OP (GAUZE/BANDAGES/DRESSINGS) ×2 IMPLANT
YANKAUER SUCT BULB TIP 10FT TU (MISCELLANEOUS) ×2 IMPLANT

## 2017-09-11 NOTE — Evaluation (Signed)
Physical Therapy Evaluation Patient Details Name: Angela Moses MRN: [id number] DOB: 1956/03/09 Today's Date: 09/11/2017   History of Present Illness  61 yo female s/p L TKA 09/11/17  Clinical Impression  On eval POD 0, pt required Min assist for mobility. She walked ~50 feet with a RW. Pain rated 5/10 with activity. Will follow and progress activity as tolerated.     Follow Up Recommendations DC plan and follow up therapy as arranged by surgeon(OP PT)    Equipment Recommendations  None recommended by PT    Recommendations for Other Services OT consult     Precautions / Restrictions Precautions Precautions: Knee;Fall Required Braces or Orthoses: Knee Immobilizer - Left Knee Immobilizer - Left: Discontinue once straight leg raise with < 10 degree lag Restrictions Weight Bearing Restrictions: No LLE Weight Bearing: Weight bearing as tolerated      Mobility  Bed Mobility Overal bed mobility: Needs Assistance Bed Mobility: Supine to Sit     Supine to sit: Min assist;HOB elevated     General bed mobility comments: Assist for L LE.   Transfers Overall transfer level: Needs assistance Equipment used: Rolling walker (2 wheeled) Transfers: Sit to/from Stand Sit to Stand: Min assist;From elevated surface         General transfer comment: Assist to rise, stabilize, control descent. VCs safety, technique, hand/LE placement  Ambulation/Gait Ambulation/Gait assistance: Min assist Ambulation Distance (Feet): 50 Feet Assistive device: Rolling walker (2 wheeled) Gait Pattern/deviations: Step-to pattern     General Gait Details: VCs safety, technique, sequence. L knee instability noted.   Stairs            Wheelchair Mobility    Modified Rankin (Stroke Patients Only)       Balance                                             Pertinent Vitals/Pain Pain Assessment: 0-10 Pain Score: 5  Pain Location: L knee Pain Descriptors /  Indicators: Aching;Sore Pain Intervention(s): Monitored during session;Repositioned;Ice applied    Home Living Family/patient expects to be discharged to:: Private residence Living Arrangements: Spouse/significant other   Type of Home: House Home Access: Stairs to enter Entrance Stairs-Rails: Right;Left;Can reach both Entrance Stairs-Number of Steps: 3 Home Layout: One level Home Equipment: Environmental consultant - 2 wheels;Bedside commode      Prior Function Level of Independence: Independent               Hand Dominance        Extremity/Trunk Assessment   Upper Extremity Assessment Upper Extremity Assessment: Defer to OT evaluation    Lower Extremity Assessment Lower Extremity Assessment: Generalized weakness(s/p L TKA)    Cervical / Trunk Assessment Cervical / Trunk Assessment: Normal  Communication   Communication: No difficulties  Cognition Arousal/Alertness: Awake/alert Behavior During Therapy: WFL for tasks assessed/performed Overall Cognitive Status: Within Functional Limits for tasks assessed                                        General Comments      Exercises     Assessment/Plan    PT Assessment Patient needs continued PT services  PT Problem List Decreased strength;Decreased mobility;Decreased range of motion;Decreased activity tolerance;Decreased balance;Pain;Decreased knowledge of precautions  PT Treatment Interventions DME instruction;Gait training;Therapeutic exercise;Patient/family education;Therapeutic activities;Balance training;Functional mobility training    PT Goals (Current goals can be found in the Care Plan section)  Acute Rehab PT Goals Patient Stated Goal: regain independence and PLOF PT Goal Formulation: With patient/family Time For Goal Achievement: 09/25/17 Potential to Achieve Goals: Good    Frequency 7X/week   Barriers to discharge        Co-evaluation               AM-PAC PT "6 Clicks" Daily  Activity  Outcome Measure Difficulty turning over in bed (including adjusting bedclothes, sheets and blankets)?: Unable Difficulty moving from lying on back to sitting on the side of the bed? : Unable Difficulty sitting down on and standing up from a chair with arms (e.g., wheelchair, bedside commode, etc,.)?: Unable Help needed moving to and from a bed to chair (including a wheelchair)?: A Little Help needed walking in hospital room?: A Little Help needed climbing 3-5 steps with a railing? : A Little 6 Click Score: 12    End of Session Equipment Utilized During Treatment: Gait belt;Left knee immobilizer Activity Tolerance: Patient tolerated treatment well Patient left: in chair;with call bell/phone within reach;with family/visitor present   PT Visit Diagnosis: Muscle weakness (generalized) (M62.81);Difficulty in walking, not elsewhere classified (R26.2)    Time: 9470-9628 PT Time Calculation (min) (ACUTE ONLY): 23 min   Charges:   PT Evaluation $PT Eval Low Complexity: 1 Low PT Treatments $Gait Training: 8-22 mins   PT G Codes:          Weston Anna, MPT Pager: [phone]

## 2017-09-11 NOTE — Transfer of Care (Signed)
Immediate Anesthesia Transfer of Care Note  Patient: Angela Moses  Procedure(s) Performed: LEFT TOTAL KNEE ARTHROPLASTY (Left Knee)  Patient Location: PACU  Anesthesia Type:Spinal  Level of Consciousness: awake, alert  and oriented  Airway & Oxygen Therapy: Patient Spontanous Breathing and Patient connected to face mask oxygen  Post-op Assessment: Report given to RN  Post vital signs: Reviewed and stable  Last Vitals:  Vitals:   09/11/17 0850 09/11/17 0855  BP: (!) 119/91 115/68  Pulse: 68 73  Resp: 15 17  Temp:    SpO2: 96% 95%    Last Pain:  Vitals:   09/11/17 0724  TempSrc: Oral      Patients Stated Pain Goal: 3 (15/94/58 5929)  Complications: No apparent anesthesia complications

## 2017-09-11 NOTE — Op Note (Signed)
OPERATIVE REPORT-TOTAL KNEE ARTHROPLASTY   Pre-operative diagnosis- Osteoarthritis  Left knee(s)  Post-operative diagnosis- Osteoarthritis Left knee(s)  Procedure-  Left  Total Knee Arthroplasty (Depuy Attune)  Surgeon- Dione Plover. Eleri Ruben, MD  Assistant- Ardeen Jourdain, PA-C   Anesthesia-  Adductor canal block and spinal  EBL-25 ml  Drains Hemovac  Tourniquet time- 34 minutes @ 585 mm Hg    Complications- None  Condition-PACU - hemodynamically stable.   Brief Clinical Note  Angela Moses is a 61 y.o. year old female with end stage OA of her left knee with progressively worsening pain and dysfunction. She has constant pain, with activity and at rest and significant functional deficits with difficulties even with ADLs. She has had extensive non-op management including analgesics, injections of cortisone and viscosupplements, and home exercise program, but remains in significant pain with significant dysfunction. Radiographs show bone on bone arthritis medial and patellofemoral. She presents now for left Total Knee Arthroplasty.    Procedure in detail---   The patient is brought into the operating room and positioned supine on the operating table. After successful administration of  Adductor canal block and spinal,   a tourniquet is placed high on the  Left thigh(s) and the lower extremity is prepped and draped in the usual sterile fashion. Time out is performed by the operating team and then the  Left lower extremity is wrapped in Esmarch, knee flexed and the tourniquet inflated to 300 mmHg.       A midline incision is made with a ten blade through the subcutaneous tissue to the level of the extensor mechanism. A fresh blade is used to make a medial parapatellar arthrotomy. Soft tissue over the proximal medial tibia is subperiosteally elevated to the joint line with a knife and into the semimembranosus bursa with a Cobb elevator. Soft tissue over the proximal lateral tibia is  elevated with attention being paid to avoiding the patellar tendon on the tibial tubercle. The patella is everted, knee flexed 90 degrees and the ACL and PCL are removed. Findings are bone on bone medial and patellofemoral with large global osteophytes.        The drill is used to create a starting hole in the distal femur and the canal is thoroughly irrigated with sterile saline to remove the fatty contents. The 5 degree Left  valgus alignment guide is placed into the femoral canal and the distal femoral cutting block is pinned to remove 9 mm off the distal femur. Resection is made with an oscillating saw.      The tibia is subluxed forward and the menisci are removed. The extramedullary alignment guide is placed referencing proximally at the medial aspect of the tibial tubercle and distally along the second metatarsal axis and tibial crest. The block is pinned to remove 24mm off the more deficient medial  side. Resection is made with an oscillating saw. Size 5is the most appropriate size for the tibia and the proximal tibia is prepared with the modular drill and keel punch for that size.      The femoral sizing guide is placed and size 6 is most appropriate. Rotation is marked off the epicondylar axis and confirmed by creating a rectangular flexion gap at 90 degrees. The size 6 cutting block is pinned in this rotation and the anterior, posterior and chamfer cuts are made with the oscillating saw. The intercondylar block is then placed and that cut is made.      Trial size 5 tibial component, trial  size 6 posterior stabilized femur and a 8  mm posterior stabilized rotating platform insert trial is placed. Full extension is achieved with excellent varus/valgus and anterior/posterior balance throughout full range of motion. The patella is everted and thickness measured to be 22  mm. Free hand resection is taken to 12 mm, a 38 template is placed, lug holes are drilled, trial patella is placed, and it tracks  normally. Osteophytes are removed off the posterior femur with the trial in place. All trials are removed and the cut bone surfaces prepared with pulsatile lavage. Cement is mixed and once ready for implantation, the size 5 tibial implant, size  6 posterior stabilized femoral component, and the size 38 patella are cemented in place and the patella is held with the clamp. The trial insert is placed and the knee held in full extension. The Exparel (20 ml mixed with 60 ml saline) isinjected into the extensor mechanism, posterior capsule, medial and lateral gutters and subcutaneous tissues.  All extruded cement is removed and once the cement is hard the permanent 8 mm posterior stabilized rotating platform insert is placed into the tibial tray.      The wound is copiously irrigated with saline solution and the extensor mechanism closed over a hemovac drain with #1 V-loc suture. The tourniquet is released for a total tourniquet time of 34  minutes. Flexion against gravity is 140 degrees and the patella tracks normally. Subcutaneous tissue is closed with 2.0 vicryl and subcuticular with running 4.0 Monocryl. The incision is cleaned and dried and steri-strips and a bulky sterile dressing are applied. The limb is placed into a knee immobilizer and the patient is awakened and transported to recovery in stable condition.      Please note that a surgical assistant was a medical necessity for this procedure in order to perform it in a safe and expeditious manner. Surgical assistant was necessary to retract the ligaments and vital neurovascular structures to prevent injury to them and also necessary for proper positioning of the limb to allow for anatomic placement of the prosthesis.   Dione Plover [name], MD    09/11/2017, 10:21 AM

## 2017-09-11 NOTE — Anesthesia Postprocedure Evaluation (Signed)
Anesthesia Post Note  Patient: Angela Moses  Procedure(s) Performed: LEFT TOTAL KNEE ARTHROPLASTY (Left Knee)     Patient location during evaluation: PACU Anesthesia Type: Spinal and Regional Level of consciousness: oriented and awake and alert Pain management: pain level controlled Vital Signs Assessment: post-procedure vital signs reviewed and stable Respiratory status: spontaneous breathing, respiratory function stable and patient connected to nasal cannula oxygen Cardiovascular status: blood pressure returned to baseline and stable Postop Assessment: no headache, no backache, no apparent nausea or vomiting, spinal receding and patient able to bend at knees Anesthetic complications: no    Last Vitals:  Vitals:   09/11/17 1145 09/11/17 1200  BP: (!) 116/104 (!) 118/59  Pulse: 71 (!) 57  Resp: 14 13  Temp: (!) 36.4 C (!) 36.1 C  SpO2: 100% 100%    Last Pain:  Vitals:   09/11/17 1200  TempSrc:   PainSc: Asleep                 Effie Berkshire

## 2017-09-11 NOTE — Anesthesia Procedure Notes (Signed)
Anesthesia Regional Block: Adductor canal block   Pre-Anesthetic Checklist: ,, timeout performed, Correct Patient, Correct Site, Correct Laterality, Correct Procedure, Correct Position, site marked, Risks and benefits discussed,  Surgical consent,  Pre-op evaluation,  At surgeon's request and post-op pain management  Laterality: Left  Prep: chloraprep       Needles:  Injection technique: Single-shot  Needle Type: Echogenic Needle     Needle Length: 9cm  Needle Gauge: 21     Additional Needles:   Procedures:,,,, ultrasound used (permanent image in chart),,,,  Narrative:  Start time: 09/11/2017 8:40 AM End time: 09/11/2017 8:50 AM Injection made incrementally with aspirations every 5 mL.  Performed by: Personally  Anesthesiologist: Effie Berkshire, MD  Additional Notes: Patient tolerated the procedure well. Local anesthetic introduced in an incremental fashion under minimal resistance after negative aspirations. No paresthesias were elicited. After completion of the procedure, no acute issues were identified and patient continued to be monitored by RN.

## 2017-09-11 NOTE — Anesthesia Procedure Notes (Signed)
Spinal  Patient location during procedure: OR Start time: 09/11/2017 9:13 AM End time: 09/11/2017 9:15 AM Staffing Resident/CRNA: Lind Covert, CRNA Performed: resident/CRNA  Preanesthetic Checklist Completed: patient identified, site marked, surgical consent, pre-op evaluation, timeout performed, IV checked, risks and benefits discussed and monitors and equipment checked Spinal Block Patient position: sitting Prep: Betadine Patient monitoring: cardiac monitor Approach: midline Location: L3-4 Injection technique: single-shot Needle Needle type: Pencan  Needle gauge: 24 G Needle length: 10 cm Needle insertion depth: 7 cm Assessment Sensory level: T6 Additional Notes Timeout performed. SAB kit date checked. SAB without difficulty

## 2017-09-11 NOTE — Progress Notes (Signed)
Assisted Dr. Hollis with left, ultrasound guided, adductor canal block. Side rails up, monitors on throughout procedure. See vital signs in flow sheet. Tolerated Procedure well.  

## 2017-09-11 NOTE — Interval H&P Note (Signed)
History and Physical Interval Note:  09/11/2017 7:14 AM  Angela Moses  has presented today for surgery, with the diagnosis of Left knee osteoarthritis   The various methods of treatment have been discussed with the patient and family. After consideration of risks, benefits and other options for treatment, the patient has consented to  Procedure(s): LEFT TOTAL KNEE ARTHROPLASTY (Left) as a surgical intervention .  The patient's history has been reviewed, patient examined, no change in status, stable for surgery.  I have reviewed the patient's chart and labs.  Questions were answered to the patient's satisfaction.     Pilar Plate Sherrice Creekmore

## 2017-09-11 NOTE — Anesthesia Preprocedure Evaluation (Addendum)
Anesthesia Evaluation  Patient identified by MRN, date of birth, ID band Patient awake    Reviewed: Allergy & Precautions, NPO status , Patient's Chart, lab work & pertinent test results  Airway Mallampati: III  TM Distance: >3 FB Neck ROM: Full    Dental  (+) Teeth Intact, Dental Advisory Given   Pulmonary neg pulmonary ROS,    breath sounds clear to auscultation       Cardiovascular negative cardio ROS   Rhythm:Regular Rate:Normal     Neuro/Psych negative neurological ROS     GI/Hepatic negative GI ROS, Neg liver ROS,   Endo/Other  negative endocrine ROS  Renal/GU negative Renal ROS     Musculoskeletal  (+) Arthritis ,   Abdominal   Peds  Hematology negative hematology ROS (+)   Anesthesia Other Findings - HLD  Reproductive/Obstetrics                            Lab Results  Component Value Date   WBC 6.9 09/06/2017   HGB 13.6 09/06/2017   HCT 41.4 09/06/2017   MCV 92.2 09/06/2017   PLT 226 09/06/2017   EKG: normal sinus rhythm.  Anesthesia Physical Anesthesia Plan  ASA: II  Anesthesia Plan: Spinal   Post-op Pain Management:  Regional for Post-op pain   Induction: Intravenous  PONV Risk Score and Plan: 3 and Propofol infusion, Dexamethasone and Ondansetron  Airway Management Planned: Natural Airway and Nasal Cannula  Additional Equipment:   Intra-op Plan:   Post-operative Plan:   Informed Consent: I have reviewed the patients History and Physical, chart, labs and discussed the procedure including the risks, benefits and alternatives for the proposed anesthesia with the patient or authorized representative who has indicated his/her understanding and acceptance.     Plan Discussed with:   Anesthesia Plan Comments:         Anesthesia Quick Evaluation

## 2017-09-11 NOTE — Discharge Instructions (Addendum)
° °Dr. Frank Aluisio °Total Joint Specialist °[facility] °[address] °[place], [place] [address] °[phone] ° °TOTAL KNEE REPLACEMENT POSTOPERATIVE DIRECTIONS ° °Knee Rehabilitation, Guidelines Following Surgery  °Results after knee surgery are often greatly improved when you follow the exercise, range of motion and muscle strengthening exercises prescribed by your doctor. Safety measures are also important to protect the knee from further injury. Any time any of these exercises cause you to have increased pain or swelling in your knee joint, decrease the amount until you are comfortable again and slowly increase them. If you have problems or questions, call your caregiver or physical therapist for advice.  ° °HOME CARE INSTRUCTIONS  °Remove items at home which could result in a fall. This includes throw rugs or furniture in walking pathways.  °· ICE to the affected knee every three hours for 30 minutes at a time and then as needed for pain and swelling.  Continue to use ice on the knee for pain and swelling from surgery. You may notice swelling that will progress down to the foot and ankle.  This is normal after surgery.  Elevate the leg when you are not up walking on it.   °· Continue to use the breathing machine which will help keep your temperature down.  It is common for your temperature to cycle up and down following surgery, especially at night when you are not up moving around and exerting yourself.  The breathing machine keeps your lungs expanded and your temperature down. °· Do not place pillow under knee, focus on keeping the knee straight while resting ° °DIET °You may resume your previous home diet once your are discharged from the hospital. ° °DRESSING / WOUND CARE / SHOWERING °You may shower 3 days after surgery, but keep the wounds dry during showering.  You may use an occlusive plastic wrap (Press'n Seal for example), NO SOAKING/SUBMERGING IN THE BATHTUB.  If the  bandage gets wet, change with a clean dry gauze.  If the incision gets wet, pat the wound dry with a clean towel. °You may start showering once you are discharged home but do not submerge the incision under water. Just pat the incision dry and apply a dry gauze dressing on daily. °Change the surgical dressing daily and reapply a dry dressing each time. ° °ACTIVITY °Walk with your walker as instructed. °Use walker as long as suggested by your caregivers. °Avoid periods of inactivity such as sitting longer than an hour when not asleep. This helps prevent blood clots.  °You may resume a sexual relationship in one month or when given the OK by your doctor.  °You may return to work once you are cleared by your doctor.  °Do not drive a car for 6 weeks or until released by you surgeon.  °Do not drive while taking narcotics. ° °WEIGHT BEARING °Weight bearing as tolerated with assist device (walker, cane, etc) as directed, use it as long as suggested by your surgeon or therapist, typically at least 4-6 weeks. ° °POSTOPERATIVE CONSTIPATION PROTOCOL °Constipation - defined medically as fewer than three stools per week and severe constipation as less than one stool per week. ° °One of the most common issues patients have following surgery is constipation.  Even if you have a regular bowel pattern at home, your normal regimen is likely to be disrupted due to multiple reasons following surgery.  Combination of anesthesia, postoperative narcotics, change in appetite and fluid intake all can affect your bowels.    In order to avoid complications following surgery, here are some recommendations in order to help you during your recovery period. ° °Colace (docusate) - Pick up an over-the-counter form of Colace or another stool softener and take twice a day as long as you are requiring postoperative pain medications.  Take with a full glass of water daily.  If you experience loose stools or diarrhea, hold the colace until you stool forms  back up.  If your symptoms do not get better within 1 week or if they get worse, check with your doctor. ° °Dulcolax (bisacodyl) - Pick up over-the-counter and take as directed by the product packaging as needed to assist with the movement of your bowels.  Take with a full glass of water.  Use this product as needed if not relieved by Colace only.  ° °MiraLax (polyethylene glycol) - Pick up over-the-counter to have on hand.  MiraLax is a solution that will increase the amount of water in your bowels to assist with bowel movements.  Take as directed and can mix with a glass of water, juice, soda, coffee, or tea.  Take if you go more than two days without a movement. °Do not use MiraLax more than once per day. Call your doctor if you are still constipated or irregular after using this medication for 7 days in a row. ° °If you continue to have problems with postoperative constipation, please contact the office for further assistance and recommendations.  If you experience "the worst abdominal pain ever" or develop nausea or vomiting, please contact the office immediatly for further recommendations for treatment. ° °ITCHING ° If you experience itching with your medications, try taking only a single pain pill, or even half a pain pill at a time.  You can also use Benadryl over the counter for itching or also to help with sleep.  ° °TED HOSE STOCKINGS °Wear the elastic stockings on both legs for three weeks following surgery during the day but you may remove then at night for sleeping. ° °MEDICATIONS °See your medication summary on the “After Visit Summary” that the nursing staff will review with you prior to discharge.  You may have some home medications which will be placed on hold until you complete the course of blood thinner medication.  It is important for you to complete the blood thinner medication as prescribed by your surgeon.  Continue your approved medications as instructed at time of  discharge. ° °PRECAUTIONS °If you experience chest pain or shortness of breath - call 911 immediately for transfer to the hospital emergency department.  °If you develop a fever greater that 101 F, purulent drainage from wound, increased redness or drainage from wound, foul odor from the wound/dressing, or calf pain - CONTACT YOUR SURGEON.   °                                                °FOLLOW-UP APPOINTMENTS °Make sure you keep all of your appointments after your operation with your surgeon and caregivers. You should call the office at the above phone number and make an appointment for approximately two weeks after the date of your surgery or on the date instructed by your surgeon outlined in the "After Visit Summary". ° ° °RANGE OF MOTION AND STRENGTHENING EXERCISES  °Rehabilitation of the knee is important following a knee injury or   an operation. After just a few days of immobilization, the muscles of the thigh which control the knee become weakened and shrink (atrophy). Knee exercises are designed to build up the tone and strength of the thigh muscles and to improve knee motion. Often times heat used for twenty to thirty minutes before working out will loosen up your tissues and help with improving the range of motion but do not use heat for the first two weeks following surgery. These exercises can be done on a training (exercise) mat, on the floor, on a table or on a bed. Use what ever works the best and is most comfortable for you Knee exercises include:  °Leg Lifts - While your knee is still immobilized in a splint or cast, you can do straight leg raises. Lift the leg to 60 degrees, hold for 3 sec, and slowly lower the leg. Repeat 10-20 times 2-3 times daily. Perform this exercise against resistance later as your knee gets better.  °Quad and Hamstring Sets - Tighten up the muscle on the front of the thigh (Quad) and hold for 5-10 sec. Repeat this 10-20 times hourly. Hamstring sets are done by pushing the  foot backward against an object and holding for 5-10 sec. Repeat as with quad sets.  °· Leg Slides: Lying on your back, slowly slide your foot toward your buttocks, bending your knee up off the floor (only go as far as is comfortable). Then slowly slide your foot back down until your leg is flat on the floor again. °· Angel Wings: Lying on your back spread your legs to the side as far apart as you can without causing discomfort.  °A rehabilitation program following serious knee injuries can speed recovery and prevent re-injury in the future due to weakened muscles. Contact your doctor or a physical therapist for more information on knee rehabilitation.  ° °IF YOU ARE TRANSFERRED TO A SKILLED REHAB FACILITY °If the patient is transferred to a skilled rehab facility following release from the hospital, a list of the current medications will be sent to the facility for the patient to continue.  When discharged from the skilled rehab facility, please have the facility set up the patient's Home Health Physical Therapy prior to being released. Also, the skilled facility will be responsible for providing the patient with their medications at time of release from the facility to include their pain medication, the muscle relaxants, and their blood thinner medication. If the patient is still at the rehab facility at time of the two week follow up appointment, the skilled rehab facility will also need to assist the patient in arranging follow up appointment in our office and any transportation needs. ° °MAKE SURE YOU:  °Understand these instructions.  °Get help right away if you are not doing well or get worse.  ° ° °Pick up stool softner and laxative for home use following surgery while on pain medications. °Do not submerge incision under water. °Please use good hand washing techniques while changing dressing each day. °May shower starting three days after surgery. °Please use a clean towel to pat the incision dry following  showers. °Continue to use ice for pain and swelling after surgery. °Do not use any lotions or creams on the incision until instructed by your surgeon. ° °Take Xarelto for two and a half more weeks following discharge from the hospital, then discontinue Xarelto. °Once the patient has completed the blood thinner regimen, then take a Baby 81 mg Aspirin daily for three   more weeks.    Information on my medicine - XARELTO (Rivaroxaban)  This medication education was reviewed with me or my healthcare representative as part of my discharge preparation.  The pharmacist that spoke with me during my hospital stay was:    Why was Xarelto prescribed for you? Xarelto was prescribed for you to reduce the risk of blood clots forming after orthopedic surgery. The medical term for these abnormal blood clots is venous thromboembolism (VTE).  What do you need to know about xarelto ? Take your Xarelto ONCE DAILY at the same time every day. You may take it either with or without food.  If you have difficulty swallowing the tablet whole, you may crush it and mix in applesauce just prior to taking your dose.  Take Xarelto exactly as prescribed by your doctor and DO NOT stop taking Xarelto without talking to the doctor who prescribed the medication.  Stopping without other VTE prevention medication to take the place of Xarelto may increase your risk of developing a clot.  After discharge, you should have regular check-up appointments with your healthcare provider that is prescribing your Xarelto.    What do you do if you miss a dose? If you miss a dose, take it as soon as you remember on the same day then continue your regularly scheduled once daily regimen the next day. Do not take two doses of Xarelto on the same day.   Important Safety Information A possible side effect of Xarelto is bleeding. You should call your healthcare provider right away if you experience any of the following: ? Bleeding from  an injury or your nose that does not stop. ? Unusual colored urine (red or dark brown) or unusual colored stools (red or black). ? Unusual bruising for unknown reasons. ? A serious fall or if you hit your head (even if there is no bleeding).  Some medicines may interact with Xarelto and might increase your risk of bleeding while on Xarelto. To help avoid this, consult your healthcare provider or pharmacist prior to using any new prescription or non-prescription medications, including herbals, vitamins, non-steroidal anti-inflammatory drugs (NSAIDs) and supplements.  This website has more information on Xarelto: [email or web address]

## 2017-09-12 DIAGNOSIS — M1712 Unilateral primary osteoarthritis, left knee: Secondary | ICD-10-CM | POA: Diagnosis not present

## 2017-09-12 LAB — CBC
HEMATOCRIT: 36.4 % (ref 36.0–46.0)
HEMOGLOBIN: 12 g/dL (ref 12.0–15.0)
MCH: 29.7 pg (ref 26.0–34.0)
MCHC: 33 g/dL (ref 30.0–36.0)
MCV: 90.1 fL (ref 78.0–100.0)
Platelets: 216 10*3/uL (ref 150–400)
RBC: 4.04 MIL/uL (ref 3.87–5.11)
RDW: 13.6 % (ref 11.5–15.5)
WBC: 17.8 10*3/uL — AB (ref 4.0–10.5)

## 2017-09-12 LAB — BASIC METABOLIC PANEL
ANION GAP: 6 (ref 5–15)
BUN: 12 mg/dL (ref 6–20)
CHLORIDE: 106 mmol/L (ref 101–111)
CO2: 25 mmol/L (ref 22–32)
Calcium: 8.6 mg/dL — ABNORMAL LOW (ref 8.9–10.3)
Creatinine, Ser: 0.81 mg/dL (ref 0.44–1.00)
GFR calc non Af Amer: >60 mL/min (ref >60)
GLUCOSE: 117 mg/dL — AB (ref 65–99)
POTASSIUM: 3.9 mmol/L (ref 3.5–5.1)
Sodium: 137 mmol/L (ref 135–145)

## 2017-09-12 MED ORDER — GABAPENTIN 300 MG PO CAPS: 300.0000 mg | ORAL_CAPSULE | Freq: Three times a day (TID) | ORAL | 0 refills | Status: DC

## 2017-09-12 MED ORDER — METHOCARBAMOL 500 MG PO TABS: 500.0000 mg | ORAL_TABLET | Freq: Four times a day (QID) | ORAL | 0 refills | Status: DC | PRN

## 2017-09-12 MED ORDER — OXYCODONE HCL 5 MG PO TABS: 5.0000 mg | ORAL_TABLET | ORAL | 0 refills | Status: DC | PRN

## 2017-09-12 MED ORDER — TRAMADOL HCL 50 MG PO TABS: 50.0000 mg | ORAL_TABLET | Freq: Four times a day (QID) | ORAL | 0 refills | Status: DC | PRN

## 2017-09-12 MED ORDER — RIVAROXABAN 10 MG PO TABS: 10.0000 mg | ORAL_TABLET | Freq: Every day | ORAL | 0 refills | Status: DC

## 2017-09-12 NOTE — Progress Notes (Signed)
Physical Therapy Treatment Patient Details Name: Angela Moses MRN: [id number] DOB: 10/11/1955 Today's Date: 09/12/2017    History of Present Illness 61 yo female s/p L TKA 09/11/17    PT Comments    Progressing well with mobility. Will plan to have a 2nd session prior to possible d/c later today.    Follow Up Recommendations  DC plan and follow up therapy as arranged by surgeon(OP PT)     Equipment Recommendations  None recommended by PT    Recommendations for Other Services       Precautions / Restrictions Precautions Precautions: Fall;Knee Required Braces or Orthoses: Knee Immobilizer - Left Knee Immobilizer - Left: Discontinue once straight leg raise with < 10 degree lag Restrictions Weight Bearing Restrictions: No LLE Weight Bearing: Weight bearing as tolerated    Mobility  Bed Mobility Overal bed mobility: Needs Assistance Bed Mobility: Sit to Supine      Sit to supine: Min assist   General bed mobility comments: Assist for L LE.   Transfers Overall transfer level: Needs assistance Equipment used: Rolling walker (2 wheeled) Transfers: Sit to/from Stand Sit to Stand: Min guard         General transfer comment: close guard for safety. VCs safety, technique, hand/LE placement  Ambulation/Gait Ambulation/Gait assistance: Min guard Ambulation Distance (Feet): 90 Feet Assistive device: Rolling walker (2 wheeled) Gait Pattern/deviations: Step-to pattern   Gait velocity interpretation: Below normal speed for age/gender General Gait Details: VCs safety, technique, sequence. Some L knee instability noted. Slow gait speed.    Stairs            Wheelchair Mobility    Modified Rankin (Stroke Patients Only)       Balance                                            Cognition Arousal/Alertness: Awake/alert Behavior During Therapy: WFL for tasks assessed/performed Overall Cognitive Status: Within Functional Limits for tasks  assessed                                        Exercises Total Joint Exercises Ankle Circles/Pumps: AROM;Both;10 reps;Supine Quad Sets: AROM;Both;10 reps;Supine Short Arc Quad: AAROM;Left;10 reps;Supine Heel Slides: AAROM;Left;10 reps;Supine Straight Leg Raises: AROM;Left;10 reps;Supine    General Comments        Pertinent Vitals/Pain Pain Assessment: 0-10 Pain Score: 5  Pain Location: L knee Pain Descriptors / Indicators: Aching;Sore Pain Intervention(s): Monitored during session;Repositioned;Ice applied    Home Living Family/patient expects to be discharged to:: Private residence Living Arrangements: Spouse/significant other Available Help at Discharge: Family Type of Home: House Home Access: Stairs to enter Entrance Stairs-Rails: Right;Left;Can reach both Home Layout: One level Home Equipment: Environmental consultant - 2 wheels;Bedside commode;Shower seat - built in      Prior Function Level of Independence: Independent          PT Goals (current goals can now be found in the care plan section) Acute Rehab PT Goals Patient Stated Goal: regain independence and PLOF Progress towards PT goals: Progressing toward goals    Frequency    7X/week      PT Plan Current plan remains appropriate    Co-evaluation              AM-PAC PT "6  Clicks" Daily Activity  Outcome Measure  Difficulty turning over in bed (including adjusting bedclothes, sheets and blankets)?: A Little Difficulty moving from lying on back to sitting on the side of the bed? : Unable Difficulty sitting down on and standing up from a chair with arms (e.g., wheelchair, bedside commode, etc,.)?: A Little Help needed moving to and from a bed to chair (including a wheelchair)?: A Little Help needed walking in hospital room?: A Little Help needed climbing 3-5 steps with a railing? : A Little 6 Click Score: 16    End of Session Equipment Utilized During Treatment: Gait belt;Left knee  immobilizer Activity Tolerance: Patient tolerated treatment well Patient left: in bed;with call bell/phone within reach   PT Visit Diagnosis: Muscle weakness (generalized) (M62.81);Difficulty in walking, not elsewhere classified (R26.2)     Time: 5056-9794 PT Time Calculation (min) (ACUTE ONLY): 18 min  Charges:  $Gait Training: 8-22 mins                    G Codes:          Weston Anna, MPT Pager: [phone]

## 2017-09-12 NOTE — Progress Notes (Signed)
Physical Therapy Treatment Patient Details Name: Angela Moses MRN: [id number] DOB: 05/18/1956 Today's Date: 09/12/2017    History of Present Illness 61 yo female s/p L TKA 09/11/17    PT Comments    Progressing with mobility. Reviewed exercises, gait training, and stair training. Issued HEP for pt to perform 2x/day until she begins OP PT. All education completed. Okay to d/c from PT standpoint-made RN aware.    Follow Up Recommendations  DC plan and follow up therapy as arranged by surgeon(Op PT)     Equipment Recommendations  None recommended by PT    Recommendations for Other Services       Precautions / Restrictions Precautions Precautions: Fall;Knee Required Braces or Orthoses: Knee Immobilizer - Left Knee Immobilizer - Left: Discontinue once straight leg raise with < 10 degree lag Restrictions Weight Bearing Restrictions: No LLE Weight Bearing: Weight bearing as tolerated    Mobility  Bed Mobility Overal bed mobility: Needs Assistance Bed Mobility: Supine to Sit;Sit to Supine     Supine to sit: Min guard Sit to supine: Min assist   General bed mobility comments: Assist for L LE.   Transfers Overall transfer level: Needs assistance Equipment used: Rolling walker (2 wheeled) Transfers: Sit to/from Stand Sit to Stand: Min guard         General transfer comment: close guard for safety. VCs safety, technique, hand/LE placement  Ambulation/Gait Ambulation/Gait assistance: Min guard Ambulation Distance (Feet): 60 Feet Assistive device: Rolling walker (2 wheeled) Gait Pattern/deviations: Step-to pattern   Gait velocity interpretation: Below normal speed for age/gender General Gait Details: VCs safety, technique, sequence. Slow gait speed.    Stairs Stairs: Yes Min guard Assist Stair Management: Step to pattern;Forwards;Two rails Number of Stairs: 2 General stair comments: up and over portable steps. VCs safety, sequence, technique. close guard for  safety.   Wheelchair Mobility    Modified Rankin (Stroke Patients Only)       Balance                                            Cognition Arousal/Alertness: Awake/alert Behavior During Therapy: WFL for tasks assessed/performed Overall Cognitive Status: Within Functional Limits for tasks assessed                                        Exercises   General Comments        Pertinent Vitals/Pain Pain Assessment: 0-10 Pain Score: 5  Pain Location: L knee Pain Descriptors / Indicators: Aching;Sore Pain Intervention(s): Monitored during session;Ice applied;Repositioned    Home Living                      Prior Function            PT Goals (current goals can now be found in the care plan section) Progress towards PT goals: Progressing toward goals    Frequency    7X/week      PT Plan Current plan remains appropriate    Co-evaluation              AM-PAC PT "6 Clicks" Daily Activity  Outcome Measure  Difficulty turning over in bed (including adjusting bedclothes, sheets and blankets)?: None Difficulty moving from lying on back to sitting on the side  of the bed? : Unable Difficulty sitting down on and standing up from a chair with arms (e.g., wheelchair, bedside commode, etc,.)?: A Little Help needed moving to and from a bed to chair (including a wheelchair)?: A Little Help needed walking in hospital room?: A Little Help needed climbing 3-5 steps with a railing? : A Little 6 Click Score: 17    End of Session Equipment Utilized During Treatment: Gait belt;Left knee immobilizer Activity Tolerance: Patient tolerated treatment well Patient left: in bed;with call bell/phone within reach   PT Visit Diagnosis: Muscle weakness (generalized) (M62.81);Difficulty in walking, not elsewhere classified (R26.2)     Time: 9244-6286 PT Time Calculation (min) (ACUTE ONLY): 21 min  Charges:  $Gait Training: 8-22 mins                     G Codes:          Weston Anna, MPT Pager: [phone]

## 2017-09-12 NOTE — Discharge Summary (Signed)
Physician Discharge Summary   Patient ID: Angela Moses MRN: [id number] DOB/AGE: [birth year] 61 y.o.  Admit date: 09/11/2017 Discharge date: 09/13/2017  Primary Diagnosis:  Osteoarthritis  Left knee(s)   Admission Diagnoses:  Past Medical History:  Diagnosis Date  . HLD (hyperlipidemia)   . Plantar fasciitis   . Psoriasis 03/11/2015   Discharge Diagnoses:   Principal Problem:   Primary osteoarthritis of both knees Active Problems:   OA (osteoarthritis) of knee  Estimated body mass index is 30.11 kg/m as calculated from the following:   Height as of this encounter: '5\' 8"'  (1.727 m).   Weight as of this encounter: 89.8 kg (198 lb).  Procedure:  Procedure(s) (LRB): LEFT TOTAL KNEE ARTHROPLASTY (Left)   Consults: None  HPI: Angela Moses is a 61 y.o. year old female with end stage OA of her left knee with progressively worsening pain and dysfunction. She has constant pain, with activity and at rest and significant functional deficits with difficulties even with ADLs. She has had extensive non-op management including analgesics, injections of cortisone and viscosupplements, and home exercise program, but remains in significant pain with significant dysfunction. Radiographs show bone on bone arthritis medial and patellofemoral. She presents now for left Total Knee Arthroplasty.     Laboratory Data: Admission on 09/11/2017  Component Date Value Ref Range Status  . WBC 09/12/2017 17.8* 4.0 - 10.5 K/uL Final  . RBC 09/12/2017 4.04  3.87 - 5.11 MIL/uL Final  . Hemoglobin 09/12/2017 12.0  12.0 - 15.0 g/dL Final  . HCT 09/12/2017 36.4  36.0 - 46.0 % Final  . MCV 09/12/2017 90.1  78.0 - 100.0 fL Final  . MCH 09/12/2017 29.7  26.0 - 34.0 pg Final  . MCHC 09/12/2017 33.0  30.0 - 36.0 g/dL Final  . RDW 09/12/2017 13.6  11.5 - 15.5 % Final  . Platelets 09/12/2017 216  150 - 400 K/uL Final  . Sodium 09/12/2017 137  135 - 145 mmol/L Final  . Potassium 09/12/2017 3.9  3.5 - 5.1  mmol/L Final  . Chloride 09/12/2017 106  101 - 111 mmol/L Final  . CO2 09/12/2017 25  22 - 32 mmol/L Final  . Glucose, Bld 09/12/2017 117* 65 - 99 mg/dL Final  . BUN 09/12/2017 12  6 - 20 mg/dL Final  . Creatinine, Ser 09/12/2017 0.81  0.44 - 1.00 mg/dL Final  . Calcium 09/12/2017 8.6* 8.9 - 10.3 mg/dL Final  . GFR calc non Af Amer 09/12/2017 >60  >60 mL/min Final  . GFR calc Af Amer 09/12/2017 >60  >60 mL/min Final   Comment: (NOTE) The eGFR has been calculated using the CKD EPI equation. This calculation has not been validated in all clinical situations. eGFR's persistently <60 mL/min signify possible Chronic Kidney Disease.   Angela Moses gap 09/12/2017 6  5 - 15 Final  Hospital Outpatient Visit on 09/06/2017  Component Date Value Ref Range Status  . aPTT 09/06/2017 31  24 - 36 seconds Final  . WBC 09/06/2017 6.9  4.0 - 10.5 K/uL Final  . RBC 09/06/2017 4.49  3.87 - 5.11 MIL/uL Final  . Hemoglobin 09/06/2017 13.6  12.0 - 15.0 g/dL Final  . HCT 09/06/2017 41.4  36.0 - 46.0 % Final  . MCV 09/06/2017 92.2  78.0 - 100.0 fL Final  . MCH 09/06/2017 30.3  26.0 - 34.0 pg Final  . MCHC 09/06/2017 32.9  30.0 - 36.0 g/dL Final  . RDW 09/06/2017 13.8  11.5 - 15.5 % Final  .  Platelets 09/06/2017 226  150 - 400 K/uL Final  . Sodium 09/06/2017 140  135 - 145 mmol/L Final  . Potassium 09/06/2017 3.5  3.5 - 5.1 mmol/L Final  . Chloride 09/06/2017 105  101 - 111 mmol/L Final  . CO2 09/06/2017 29  22 - 32 mmol/L Final  . Glucose, Bld 09/06/2017 88  65 - 99 mg/dL Final  . BUN 09/06/2017 16  6 - 20 mg/dL Final  . Creatinine, Ser 09/06/2017 0.96  0.44 - 1.00 mg/dL Final  . Calcium 09/06/2017 9.2  8.9 - 10.3 mg/dL Final  . Total Protein 09/06/2017 6.6  6.5 - 8.1 g/dL Final  . Albumin 09/06/2017 3.8  3.5 - 5.0 g/dL Final  . AST 09/06/2017 20  15 - 41 U/L Final  . ALT 09/06/2017 21  14 - 54 U/L Final  . Alkaline Phosphatase 09/06/2017 90  38 - 126 U/L Final  . Total Bilirubin 09/06/2017 0.7  0.3 - 1.2  mg/dL Final  . GFR calc non Af Amer 09/06/2017 >60  >60 mL/min Final  . GFR calc Af Amer 09/06/2017 >60  >60 mL/min Final   Comment: (NOTE) The eGFR has been calculated using the CKD EPI equation. This calculation has not been validated in all clinical situations. eGFR's persistently <60 mL/min signify possible Chronic Kidney Disease.   . Anion gap 09/06/2017 6  5 - 15 Final  . Prothrombin Time 09/06/2017 13.1  11.4 - 15.2 seconds Final  . INR 09/06/2017 1.00   Final  . ABO/RH(D) 09/06/2017 A POS   Final  . Antibody Screen 09/06/2017 NEG   Final  . Sample Expiration 09/06/2017 09/14/2017   Final  . Extend sample reason 09/06/2017 NO TRANSFUSIONS OR PREGNANCY IN THE PAST 3 MONTHS   Final  . MRSA, PCR 09/06/2017 NEGATIVE  NEGATIVE Final  . Staphylococcus aureus 09/06/2017 POSITIVE* NEGATIVE Final   Comment: (NOTE) The Xpert SA Assay (FDA approved for NASAL specimens in patients 48 years of age and older), is one component of a comprehensive surveillance program. It is not intended to diagnose infection nor to guide or monitor treatment.   . ABO/RH(D) 09/06/2017 A POS   Final     X-Rays:No results found.  EKG: Orders placed or performed in visit on 08/23/17  . EKG 12-Lead     Hospital Course: Angela Moses is a 61 y.o. who was admitted to [facility]. They were brought to the operating room on 09/11/2017 and underwent Procedure(s): LEFT TOTAL KNEE ARTHROPLASTY.  Patient tolerated the procedure well and was later transferred to the recovery room and then to the orthopaedic floor for postoperative care.  They were given PO and IV analgesics for pain control following their surgery.  They were given 24 hours of postoperative antibiotics of  Anti-infectives (From admission, onward)   Start     Dose/Rate Route Frequency Ordered Stop   09/11/17 1500  ceFAZolin (ANCEF) IVPB 2g/100 mL premix     2 g 200 mL/hr over 30 Minutes Intravenous Every 6 hours 09/11/17 1149 09/11/17  2035   09/11/17 0718  ceFAZolin (ANCEF) 2-4 GM/100ML-% IVPB    Comments:  Harvell, Gwendolyn  : cabinet override      09/11/17 0718 09/11/17 0916   09/11/17 0711  ceFAZolin (ANCEF) IVPB 2g/100 mL premix     2 g 200 mL/hr over 30 Minutes Intravenous On call to O.R. 09/11/17 3614 09/11/17 0916     and started on DVT prophylaxis in the form of Xarelto.  PT and OT were ordered for total joint protocol.  Discharge planning consulted to help with postop disposition and equipment needs.  Patient had a good night on the evening of surgery and walked about 50 feet that same day.  They started to get up OOB with therapy on day one. Hemovac drain was pulled without difficulty.  Continued to work with therapy into day two.  Dressing was changed on day two and the incision was healing well.  Patient was seen in rounds on day two and was ready to go home.  Diet - Cardiac diet Follow up - in 2 weeks Activity - WBAT Disposition - Home Condition Upon Discharge - Stable D/C Meds - See DC Summary DVT Prophylaxis - Xarelto    Discharge Instructions    Call MD / Call 911   Complete by:  As directed    If you experience chest pain or shortness of breath, CALL 911 and be transported to the hospital emergency room.  If you develope a fever above 101 F, pus (white drainage) or increased drainage or redness at the wound, or calf pain, call your surgeon's office.   Change dressing   Complete by:  As directed    Change dressing daily with sterile 4 x 4 inch gauze dressing and apply TED hose. Do not submerge the incision under water.   Constipation Prevention   Complete by:  As directed    Drink plenty of fluids.  Prune juice may be helpful.  You may use a stool softener, such as Colace (over the counter) 100 mg twice a day.  Use MiraLax (over the counter) for constipation as needed.   Diet - low sodium heart healthy   Complete by:  As directed    Discharge instructions   Complete by:  As directed    Take  Xarelto for two and a half more weeks, then discontinue Xarelto. Once the patient has completed the blood thinner regimen, then take a Baby 81 mg Aspirin daily for three more weeks.   Pick up stool softner and laxative for home use following surgery while on pain medications. Do not submerge incision under water. Please use good hand washing techniques while changing dressing each day. May shower starting three days after surgery. Please use a clean towel to pat the incision dry following showers. Continue to use ice for pain and swelling after surgery. Do not use any lotions or creams on the incision until instructed by your surgeon.  Wear both TED hose on both legs during the day every day for three weeks, but may remove the TED hose at night at home.  Postoperative Constipation Protocol  Constipation - defined medically as fewer than three stools per week and severe constipation as less than one stool per week.  One of the most common issues patients have following surgery is constipation.  Even if you have a regular bowel pattern at home, your normal regimen is likely to be disrupted due to multiple reasons following surgery.  Combination of anesthesia, postoperative narcotics, change in appetite and fluid intake all can affect your bowels.  In order to avoid complications following surgery, here are some recommendations in order to help you during your recovery period.  Colace (docusate) - Pick up an over-the-counter form of Colace or another stool softener and take twice a day as long as you are requiring postoperative pain medications.  Take with a full glass of water daily.  If you experience loose stools or diarrhea,  hold the colace until you stool forms back up.  If your symptoms do not get better within 1 week or if they get worse, check with your doctor.  Dulcolax (bisacodyl) - Pick up over-the-counter and take as directed by the product packaging as needed to assist with the  movement of your bowels.  Take with a full glass of water.  Use this product as needed if not relieved by Colace only.   MiraLax (polyethylene glycol) - Pick up over-the-counter to have on hand.  MiraLax is a solution that will increase the amount of water in your bowels to assist with bowel movements.  Take as directed and can mix with a glass of water, juice, soda, coffee, or tea.  Take if you go more than two days without a movement. Do not use MiraLax more than once per day. Call your doctor if you are still constipated or irregular after using this medication for 7 days in a row.  If you continue to have problems with postoperative constipation, please contact the office for further assistance and recommendations.  If you experience "the worst abdominal pain ever" or develop nausea or vomiting, please contact the office immediatly for further recommendations for treatment.   Do not put a pillow under the knee. Place it under the heel.   Complete by:  As directed    Do not sit on low chairs, stoools or toilet seats, as it may be difficult to get up from low surfaces   Complete by:  As directed    Driving restrictions   Complete by:  As directed    No driving until released by the physician.   Increase activity slowly as tolerated   Complete by:  As directed    Lifting restrictions   Complete by:  As directed    No lifting until released by the physician.   Patient may shower   Complete by:  As directed    You may shower without a dressing once there is no drainage.  Do not wash over the wound.  If drainage remains, do not shower until drainage stops.   TED hose   Complete by:  As directed    Use stockings (TED hose) for 3 weeks on both leg(s).  You may remove them at night for sleeping.   Weight bearing as tolerated   Complete by:  As directed    Laterality:  left   Extremity:  Lower     Allergies as of 09/12/2017      Reactions   Latex Swelling      Medication List    STOP  taking these medications   diclofenac 75 MG EC tablet Commonly known as:  VOLTAREN   MULTIVITAMIN PO   naproxen sodium 220 MG tablet Commonly known as:  ALEVE     TAKE these medications   atorvastatin 20 MG tablet Commonly known as:  LIPITOR Take 1 tablet (20 mg total) by mouth daily.   clobetasol cream 0.05 % Commonly known as:  TEMOVATE Apply 1 application daily as needed topically (for psoriasis).   gabapentin 300 MG capsule Commonly known as:  NEURONTIN Take 1 capsule (300 mg total) by mouth 3 (three) times daily. Gabapentin 300 mg Protocol Take a 300 mg capsule three times a day for one week, Then a 300 mg capsule twice a day for one week, Then a 300 mg capsule once a day for one week, then discontinue the Gabapentin.   methocarbamol 500 MG tablet Commonly  known as:  ROBAXIN Take 1 tablet (500 mg total) by mouth every 6 (six) hours as needed for muscle spasms.   oxyCODONE 5 MG immediate release tablet Commonly known as:  Oxy IR/ROXICODONE Take 1-2 tablets (5-10 mg total) by mouth every 4 (four) hours as needed for moderate pain or severe pain.   rivaroxaban 10 MG Tabs tablet Commonly known as:  XARELTO Take 1 tablet (10 mg total) by mouth daily with breakfast. Take Xarelto for two and a half more weeks following discharge from the hospital, then discontinue Xarelto. Once the patient has completed the blood thinner regimen, then take a Baby 81 mg Aspirin daily for three more weeks.   traMADol 50 MG tablet Commonly known as:  ULTRAM Take 1-2 tablets (50-100 mg total) by mouth every 6 (six) hours as needed for moderate pain (moderate pain unrelieved by 80m oxyIR).            Discharge Care Instructions  (From admission, onward)        Start     Ordered   09/12/17 0000  Weight bearing as tolerated    Question Answer Comment  Laterality left   Extremity Lower      09/12/17 0840   09/12/17 0000  Change dressing    Comments:  Change dressing daily with  sterile 4 x 4 inch gauze dressing and apply TED hose. Do not submerge the incision under water.   09/12/17 0840     Follow-up Information    AGaynelle Arabian MD. Schedule an appointment as soon as possible for a visit on 09/26/2017.   Specialty:  Orthopedic Surgery Contact information: [phone]          Signed: DArlee Muslim PA-C Orthopaedic Surgery 09/12/2017, 8:42 AM

## 2017-09-12 NOTE — Progress Notes (Signed)
   Subjective: 1 Day Post-Op Procedure(s) (LRB): LEFT TOTAL KNEE ARTHROPLASTY (Left) Patient reports pain as mild.   Patient seen in rounds for Dr. Wynelle Link. She walked about 50 feet day of surgery. Patient is well, but has had some minor complaints of pain in the knee, requiring pain medications We will resume therapy today.  If they do well with therapy and meets all goals, then will allow home later this afternoon following therapy. Plan is to go Home after hospital stay.  Objective: Vital signs in last 24 hours: Temp:  [96.5 F (35.8 C)-98.8 F (37.1 C)] 98.8 F (37.1 C) (12/04 0113) Pulse Rate:  [57-80] 78 (12/04 0113) Resp:  [9-21] 15 (12/04 0113) BP: (101-127)/(59-104) 116/61 (12/04 0113) SpO2:  [92 %-100 %] 95 % (12/04 0113)  Intake/Output from previous day:  Intake/Output Summary (Last 24 hours) at 09/12/2017 0835 Last data filed at 09/12/2017 0615 Gross per 24 hour  Intake 4015 ml  Output 2880 ml  Net 1135 ml    Intake/Output this shift: No intake/output data recorded.  Labs: Recent Labs    09/12/17 0529  HGB 12.0   Recent Labs    09/12/17 0529  WBC 17.8*  RBC 4.04  HCT 36.4  PLT 216   Recent Labs    09/12/17 0529  NA 137  K 3.9  CL 106  CO2 25  BUN 12  CREATININE 0.81  GLUCOSE 117*  CALCIUM 8.6*   No results for input(s): LABPT, INR in the last 72 hours.  EXAM General - Patient is Alert, Appropriate and Oriented Extremity - Neurovascular intact Sensation intact distally Intact pulses distally Dorsiflexion/Plantar flexion intact Dressing - dressing C/D/I Motor Function - intact, moving foot and toes well on exam.  Hemovac pulled without difficulty.  Past Medical History:  Diagnosis Date  . HLD (hyperlipidemia)   . Plantar fasciitis   . Psoriasis 03/11/2015    Assessment/Plan: 1 Day Post-Op Procedure(s) (LRB): LEFT TOTAL KNEE ARTHROPLASTY (Left) Principal Problem:   Primary osteoarthritis of both knees Active Problems:   OA  (osteoarthritis) of knee  Estimated body mass index is 30.11 kg/m as calculated from the following:   Height as of this encounter: 5\' 8"  (1.727 m).   Weight as of this encounter: 89.8 kg (198 lb). Advance diet Up with therapy  DVT Prophylaxis - Xarelto Weight-Bearing as tolerated to left leg D/C O2 and Pulse OX and try on Room Air  If meets goals and able to go home:  Diet - Cardiac diet Follow up - in 2 weeks Activity - WBAT Disposition - Home Condition Upon Discharge - pending therapy results D/C Meds - See DC Summary DVT Prophylaxis - [place], PA-C Orthopaedic Surgery

## 2017-09-12 NOTE — Progress Notes (Signed)
Occupational Therapy Evaluation Patient Details Name: Angela Moses MRN: [id number] DOB: 1956-05-07 Today's Date: 09/12/2017    History of Present Illness 61 yo female s/p L TKA 09/11/17   Clinical Impression   All OT education completed and patient questions answered. Patient states her husband will assist her with ADLs as needed at discharge. OT will sign off.    Follow Up Recommendations  No OT follow up;Supervision/Assistance - 24 hour    Equipment Recommendations  None recommended by OT    Recommendations for Other Services       Precautions / Restrictions Precautions Precautions: Knee;Fall Required Braces or Orthoses: Knee Immobilizer - Left Knee Immobilizer - Left: Discontinue once straight leg raise with < 10 degree lag Restrictions Weight Bearing Restrictions: No LLE Weight Bearing: Weight bearing as tolerated      Mobility Bed Mobility Overal bed mobility: Needs Assistance Bed Mobility: Supine to Sit     Supine to sit: Min assist;HOB elevated     General bed mobility comments: Assist for L LE.   Transfers Overall transfer level: Needs assistance Equipment used: Rolling walker (2 wheeled) Transfers: Sit to/from Stand Sit to Stand: Min guard         General transfer comment: Assist to rise, stabilize, control descent. VCs safety, technique, hand/LE placement    Balance                                           ADL either performed or assessed with clinical judgement   ADL Overall ADL's : Needs assistance/impaired Eating/Feeding: Independent;Sitting   Grooming: Wash/dry hands;Min guard;Standing   Upper Body Bathing: Set up;Sitting   Lower Body Bathing: Minimal assistance;Sit to/from stand   Upper Body Dressing : Set up;Sitting   Lower Body Dressing: Minimal assistance;Sit to/from stand   Toilet Transfer: Min guard;Cueing for sequencing;BSC;Ambulation;RW   Toileting- Water quality scientist and Hygiene: Min guard;Sit  to/from stand   Tub/ Shower Transfer: Copy Details (indicate cue type and reason): verbal education and demo of transfer technique. Handout given. Patient verbalized understanding but declined to practice. Functional mobility during ADLs: Min guard;Minimal assistance;Rolling walker General ADL Comments: Patient educated on LB self-care techniques. Husband can assist as needed. Practiced toileting, grooming during session and educated on shower transfer technique. Patient tolerated well.     Vision         Perception     Praxis      Pertinent Vitals/Pain Pain Assessment: 0-10 Pain Score: 3  Pain Location: L knee Pain Descriptors / Indicators: Aching;Sore Pain Intervention(s): Limited activity within patient's tolerance;Monitored during session;Premedicated before session;Repositioned     Hand Dominance     Extremity/Trunk Assessment Upper Extremity Assessment Upper Extremity Assessment: Overall WFL for tasks assessed   Lower Extremity Assessment Lower Extremity Assessment: Defer to PT evaluation   Cervical / Trunk Assessment Cervical / Trunk Assessment: Normal   Communication Communication Communication: No difficulties   Cognition Arousal/Alertness: Awake/alert Behavior During Therapy: WFL for tasks assessed/performed Overall Cognitive Status: Within Functional Limits for tasks assessed                                     General Comments       Exercises     Shoulder Instructions      Home Living Family/patient expects  to be discharged to:: Private residence Living Arrangements: Spouse/significant other Available Help at Discharge: Family Type of Home: House Home Access: Stairs to enter Technical brewer of Steps: 3 Entrance Stairs-Rails: Right;Left;Can reach both [place]: One level     Bathroom Shower/Tub: Occupational psychologist: Time Accessibility: Yes How Accessible:  Accessible via walker Home Equipment: Adrian - 2 wheels;Bedside commode;Shower seat - built in          Prior Functioning/Environment Level of Independence: Independent                 OT Problem List: Decreased strength;Decreased range of motion;Decreased activity tolerance;Decreased knowledge of use of DME or AE;Pain      OT Treatment/Interventions:      OT Goals(Current goals can be found in the care plan section) Acute Rehab OT Goals Patient Stated Goal: regain independence and PLOF OT Goal Formulation: All assessment and education complete, DC therapy  OT Frequency:     Barriers to D/C:            Co-evaluation              AM-PAC PT "6 Clicks" Daily Activity     Outcome Measure Help from another person eating meals?: None Help from another person taking care of personal grooming?: A Little Help from another person toileting, which includes using toliet, bedpan, or urinal?: A Little Help from another person bathing (including washing, rinsing, drying)?: A Little Help from another person to put on and taking off regular upper body clothing?: None Help from another person to put on and taking off regular lower body clothing?: A Little 6 Click Score: 20   End of Session Equipment Utilized During Treatment: Rolling walker;Left knee immobilizer Nurse Communication: Mobility status  Activity Tolerance: Patient tolerated treatment well Patient left: in chair;with call bell/phone within reach  OT Visit Diagnosis: Unsteadiness on feet (R26.81);Muscle weakness (generalized) (M62.81);Pain Pain - Right/Left: Left Pain - part of body: Knee                Time: 1031-1055 OT Time Calculation (min): 24 min Charges:  OT General Charges $OT Visit: 1 Visit OT Evaluation $OT Eval Low Complexity: 1 Low OT Treatments $Self Care/Home Management : 8-22 mins G-Codes:       Crystin Lechtenberg A Niambi Smoak 09/21/2017, 11:07 AM

## 2017-09-13 DIAGNOSIS — M1712 Unilateral primary osteoarthritis, left knee: Secondary | ICD-10-CM | POA: Diagnosis not present

## 2017-09-13 LAB — CBC
HEMATOCRIT: 38.3 % (ref 36.0–46.0)
Hemoglobin: 12.3 g/dL (ref 12.0–15.0)
MCH: 29.7 pg (ref 26.0–34.0)
MCHC: 32.1 g/dL (ref 30.0–36.0)
MCV: 92.5 fL (ref 78.0–100.0)
PLATELETS: 218 10*3/uL (ref 150–400)
RBC: 4.14 MIL/uL (ref 3.87–5.11)
RDW: 13.9 % (ref 11.5–15.5)
WBC: 14 10*3/uL — AB (ref 4.0–10.5)

## 2017-09-13 LAB — BASIC METABOLIC PANEL
ANION GAP: 7 (ref 5–15)
BUN: 12 mg/dL (ref 6–20)
CALCIUM: 9.1 mg/dL (ref 8.9–10.3)
CO2: 30 mmol/L (ref 22–32)
Chloride: 103 mmol/L (ref 101–111)
Creatinine, Ser: 0.93 mg/dL (ref 0.44–1.00)
Glucose, Bld: 102 mg/dL — ABNORMAL HIGH (ref 65–99)
Potassium: 3.6 mmol/L (ref 3.5–5.1)
Sodium: 140 mmol/L (ref 135–145)

## 2017-09-13 NOTE — Progress Notes (Signed)
   Subjective: 2 Days Post-Op Procedure(s) (LRB): LEFT TOTAL KNEE ARTHROPLASTY (Left) Patient reports pain as mild and moderate.   Tough night but better this morning. Patient seen in rounds for Dr. Wynelle Link. Patient is well, but has had some minor complaints of pain in the knee, requiring pain medications Patient is ready to go home  Objective: Vital signs in last 24 hours: Temp:  [98 F (36.7 C)-98.7 F (37.1 C)] 98.7 F (37.1 C) (12/05 0507) Pulse Rate:  [74-83] 83 (12/05 0507) Resp:  [14-16] 15 (12/05 0507) BP: (108-135)/(61-76) 116/65 (12/05 0507) SpO2:  [98 %-99 %] 98 % (12/05 0507)  Intake/Output from previous day:  Intake/Output Summary (Last 24 hours) at 09/13/2017 0754 Last data filed at 09/13/2017 0600 Gross per 24 hour  Intake 989.17 ml  Output 1400 ml  Net -410.83 ml    Intake/Output this shift: No intake/output data recorded.  Labs: Recent Labs    09/12/17 0529 09/13/17 0558  HGB 12.0 12.3   Recent Labs    09/12/17 0529 09/13/17 0558  WBC 17.8* 14.0*  RBC 4.04 4.14  HCT 36.4 38.3  PLT 216 218   Recent Labs    09/12/17 0529 09/13/17 0558  NA 137 140  K 3.9 3.6  CL 106 103  CO2 25 30  BUN 12 12  CREATININE 0.81 0.93  GLUCOSE 117* 102*  CALCIUM 8.6* 9.1   No results for input(s): LABPT, INR in the last 72 hours.  EXAM: General - Patient is Alert, Appropriate and Oriented Extremity - Neurovascular intact Sensation intact distally Incision - clean, dry Motor Function - intact, moving foot and toes well on exam.   Assessment/Plan: 2 Days Post-Op Procedure(s) (LRB): LEFT TOTAL KNEE ARTHROPLASTY (Left) Procedure(s) (LRB): LEFT TOTAL KNEE ARTHROPLASTY (Left) Past Medical History:  Diagnosis Date  . HLD (hyperlipidemia)   . Plantar fasciitis   . Psoriasis 03/11/2015   Principal Problem:   Primary osteoarthritis of both knees Active Problems:   OA (osteoarthritis) of knee  Estimated body mass index is 30.11 kg/m as calculated from  the following:   Height as of this encounter: 5\' 8"  (1.727 m).   Weight as of this encounter: 89.8 kg (198 lb). Up with therapy Diet - Cardiac diet Follow up - in 2 weeks Activity - WBAT Disposition - Home Condition Upon Discharge - Stable D/C Meds - See DC Summary DVT Prophylaxis - Xarelto  Arlee Muslim, PA-C Orthopaedic Surgery 09/13/2017, 7:54 AM

## 2017-09-13 NOTE — Progress Notes (Signed)
Physical Therapy Treatment Patient Details Name: PAVIELLE BIGGAR MRN: [id number] DOB: 1956/09/26 Today's Date: 09/13/2017    History of Present Illness 61 yo female s/p L TKA 09/11/17    PT Comments    Progressing with mobility. Reviewed/practiced exercises, gait training, and stair training. Issued HEP for pt to perform 2x/day until she begins OP PT. All education completed. Okay to d/c from PT standpoint.    Follow Up Recommendations  DC plan and follow up therapy as arranged by surgeon     Equipment Recommendations  None recommended by PT    Recommendations for Other Services       Precautions / Restrictions Precautions Precautions: Fall;Knee Required Braces or Orthoses: Knee Immobilizer - Left Knee Immobilizer - Left: Discontinue once straight leg raise with < 10 degree lag Restrictions Weight Bearing Restrictions: No LLE Weight Bearing: Weight bearing as tolerated    Mobility  Bed Mobility Overal bed mobility: Needs Assistance Bed Mobility: Supine to Sit;Sit to Supine     Supine to sit: Min assist Sit to supine: Min assist   General bed mobility comments: Assist for L LE.   Transfers Overall transfer level: Needs assistance Equipment used: Rolling walker (2 wheeled) Transfers: Sit to/from Stand Sit to Stand: Min guard         General transfer comment: close guard for safety. VCs safety, technique, hand/LE placement  Ambulation/Gait Ambulation/Gait assistance: Min guard Ambulation Distance (Feet): 75 Feet Assistive device: Rolling walker (2 wheeled) Gait Pattern/deviations: Step-to pattern     General Gait Details: close guard for safety. Slow gait speed.    Stairs Stairs: Yes   Stair Management: Step to pattern;Forwards;Two rails Number of Stairs: 2 General stair comments: up and over portable steps. VCs safety, sequence, technique. close guard for safety.   Wheelchair Mobility    Modified Rankin (Stroke Patients Only)       Balance                                             Cognition Arousal/Alertness: Awake/alert Behavior During Therapy: WFL for tasks assessed/performed Overall Cognitive Status: Within Functional Limits for tasks assessed                                        Exercises Total Joint Exercises Ankle Circles/Pumps: AROM;Both;10 reps;Supine Quad Sets: AROM;Both;10 reps;Supine Short Arc Quad: AAROM;Left;10 reps;Supine Heel Slides: AAROM;Left;10 reps;Supine Hip ABduction/ADduction: AAROM;Left;10 reps;Supine Straight Leg Raises: AROM;Left;10 reps;Supine Goniometric ROM: ~10-55 degrees    General Comments        Pertinent Vitals/Pain Pain Assessment: 0-10 Pain Score: 5  Pain Location: L knee Pain Descriptors / Indicators: Aching;Sore Pain Intervention(s): Monitored during session;Repositioned;Ice applied    Home Living                      Prior Function            PT Goals (current goals can now be found in the care plan section) Progress towards PT goals: Progressing toward goals    Frequency    7X/week      PT Plan Current plan remains appropriate    Co-evaluation              AM-PAC PT "6 Clicks" Daily Activity  Outcome Measure  Difficulty turning over in bed (including adjusting bedclothes, sheets and blankets)?: None Difficulty moving from lying on back to sitting on the side of the bed? : Unable Difficulty sitting down on and standing up from a chair with arms (e.g., wheelchair, bedside commode, etc,.)?: A Little Help needed moving to and from a bed to chair (including a wheelchair)?: A Little Help needed walking in hospital room?: A Little Help needed climbing 3-5 steps with a railing? : A Little 6 Click Score: 17    End of Session Equipment Utilized During Treatment: Gait belt;Left knee immobilizer Activity Tolerance: Patient tolerated treatment well Patient left: in bed;with call bell/phone within reach   PT  Visit Diagnosis: Muscle weakness (generalized) (M62.81);Difficulty in walking, not elsewhere classified (R26.2)     Time: 2595-6387 PT Time Calculation (min) (ACUTE ONLY): 26 min  Charges:  $Gait Training: 8-22 mins $Therapeutic Exercise: 8-22 mins                    G Codes:          Weston Anna, MPT Pager: [phone]

## 2017-09-14 NOTE — Progress Notes (Signed)
   09/12/17 1000  OT Time Calculation  OT Start Time (ACUTE ONLY) 1031  OT Stop Time (ACUTE ONLY) 1055  OT Time Calculation (min) 24 min  OT G-codes **NOT FOR INPATIENT CLASS**  Functional Assessment Tool Used Clinical judgement  Functional Limitation Self care  Self Care Current Status (M5784) CJ  Self Care Goal Status (O9629) CI  Self Care Discharge Status (B2841) CJ  OT General Charges  $OT Visit 1 Visit  OT Evaluation  $OT Eval Low Complexity 1 Low  OT Treatments  $Self Care/Home Management  8-22 mins   Falon Huesca, OT

## 2017-09-15 DIAGNOSIS — M25662 Stiffness of left knee, not elsewhere classified: Secondary | ICD-10-CM | POA: Diagnosis not present

## 2017-09-20 DIAGNOSIS — M25662 Stiffness of left knee, not elsewhere classified: Secondary | ICD-10-CM | POA: Diagnosis not present

## 2017-09-22 DIAGNOSIS — M25662 Stiffness of left knee, not elsewhere classified: Secondary | ICD-10-CM | POA: Diagnosis not present

## 2017-09-25 DIAGNOSIS — M25662 Stiffness of left knee, not elsewhere classified: Secondary | ICD-10-CM | POA: Diagnosis not present

## 2017-09-27 DIAGNOSIS — M25662 Stiffness of left knee, not elsewhere classified: Secondary | ICD-10-CM | POA: Diagnosis not present

## 2017-09-29 DIAGNOSIS — M25662 Stiffness of left knee, not elsewhere classified: Secondary | ICD-10-CM | POA: Diagnosis not present

## 2017-10-04 DIAGNOSIS — M25662 Stiffness of left knee, not elsewhere classified: Secondary | ICD-10-CM | POA: Diagnosis not present

## 2017-10-06 DIAGNOSIS — M25662 Stiffness of left knee, not elsewhere classified: Secondary | ICD-10-CM | POA: Diagnosis not present

## 2017-10-09 DIAGNOSIS — M25662 Stiffness of left knee, not elsewhere classified: Secondary | ICD-10-CM | POA: Diagnosis not present

## 2017-10-11 DIAGNOSIS — M25662 Stiffness of left knee, not elsewhere classified: Secondary | ICD-10-CM | POA: Diagnosis not present

## 2017-10-13 DIAGNOSIS — M25662 Stiffness of left knee, not elsewhere classified: Secondary | ICD-10-CM | POA: Diagnosis not present

## 2017-10-16 DIAGNOSIS — M25662 Stiffness of left knee, not elsewhere classified: Secondary | ICD-10-CM | POA: Diagnosis not present

## 2017-10-18 DIAGNOSIS — M25662 Stiffness of left knee, not elsewhere classified: Secondary | ICD-10-CM | POA: Diagnosis not present

## 2017-10-20 DIAGNOSIS — M25662 Stiffness of left knee, not elsewhere classified: Secondary | ICD-10-CM | POA: Diagnosis not present

## 2017-10-23 DIAGNOSIS — M25662 Stiffness of left knee, not elsewhere classified: Secondary | ICD-10-CM | POA: Diagnosis not present

## 2017-10-24 DIAGNOSIS — Z471 Aftercare following joint replacement surgery: Secondary | ICD-10-CM | POA: Diagnosis not present

## 2017-10-24 DIAGNOSIS — Z96652 Presence of left artificial knee joint: Secondary | ICD-10-CM | POA: Diagnosis not present

## 2017-10-25 ENCOUNTER — Other Ambulatory Visit: Payer: Self-pay

## 2017-10-25 ENCOUNTER — Encounter (HOSPITAL_COMMUNITY): Payer: Self-pay | Admitting: *Deleted

## 2017-10-25 DIAGNOSIS — M25662 Stiffness of left knee, not elsewhere classified: Secondary | ICD-10-CM | POA: Diagnosis not present

## 2017-10-25 NOTE — Progress Notes (Signed)
Please place orders in Epic as patient has surgery on 10/30/2017! Thank you!

## 2017-10-25 NOTE — Progress Notes (Signed)
Need orders in epic.  

## 2017-10-26 ENCOUNTER — Ambulatory Visit: Payer: Self-pay | Admitting: Orthopedic Surgery

## 2017-10-27 DIAGNOSIS — M25662 Stiffness of left knee, not elsewhere classified: Secondary | ICD-10-CM | POA: Diagnosis not present

## 2017-10-30 ENCOUNTER — Ambulatory Visit (HOSPITAL_COMMUNITY)
Admission: RE | Admit: 2017-10-30 | Discharge: 2017-10-30 | Disposition: A | Discharge status: Home / Self Care | Payer: 59 | Source: Ambulatory Visit | Attending: Orthopedic Surgery | Admitting: Orthopedic Surgery

## 2017-10-30 ENCOUNTER — Encounter (HOSPITAL_COMMUNITY)
Admission: RE | Disposition: A | Discharge status: Home / Self Care | Payer: Self-pay | Source: Ambulatory Visit | Attending: Orthopedic Surgery

## 2017-10-30 ENCOUNTER — Ambulatory Visit (HOSPITAL_COMMUNITY): Payer: 59 | Admitting: Certified Registered Nurse Anesthetist

## 2017-10-30 ENCOUNTER — Encounter (HOSPITAL_COMMUNITY): Payer: Self-pay | Admitting: Emergency Medicine

## 2017-10-30 DIAGNOSIS — Z96652 Presence of left artificial knee joint: Secondary | ICD-10-CM | POA: Insufficient documentation

## 2017-10-30 DIAGNOSIS — Z79899 Other long term (current) drug therapy: Secondary | ICD-10-CM | POA: Diagnosis not present

## 2017-10-30 DIAGNOSIS — M24662 Ankylosis, left knee: Secondary | ICD-10-CM | POA: Insufficient documentation

## 2017-10-30 DIAGNOSIS — T8482XA Fibrosis due to internal orthopedic prosthetic devices, implants and grafts, initial encounter: Secondary | ICD-10-CM | POA: Diagnosis present

## 2017-10-30 DIAGNOSIS — Z7982 Long term (current) use of aspirin: Secondary | ICD-10-CM | POA: Diagnosis not present

## 2017-10-30 DIAGNOSIS — M170 Bilateral primary osteoarthritis of knee: Secondary | ICD-10-CM | POA: Diagnosis not present

## 2017-10-30 DIAGNOSIS — L409 Psoriasis, unspecified: Secondary | ICD-10-CM | POA: Diagnosis not present

## 2017-10-30 DIAGNOSIS — E785 Hyperlipidemia, unspecified: Secondary | ICD-10-CM | POA: Insufficient documentation

## 2017-10-30 HISTORY — PX: KNEE CLOSED REDUCTION: SHX995

## 2017-10-30 HISTORY — DX: Unspecified osteoarthritis, unspecified site: M19.90

## 2017-10-30 LAB — CBC
HEMATOCRIT: 43.1 % (ref 36.0–46.0)
HEMOGLOBIN: 14.1 g/dL (ref 12.0–15.0)
MCH: 29.8 pg (ref 26.0–34.0)
MCHC: 32.7 g/dL (ref 30.0–36.0)
MCV: 91.1 fL (ref 78.0–100.0)
Platelets: 270 10*3/uL (ref 150–400)
RBC: 4.73 MIL/uL (ref 3.87–5.11)
RDW: 13.4 % (ref 11.5–15.5)
WBC: 7.2 10*3/uL (ref 4.0–10.5)

## 2017-10-30 SURGERY — MANIPULATION, KNEE, CLOSED
Anesthesia: General | Site: Knee | Laterality: Left

## 2017-10-30 MED ORDER — OXYCODONE HCL 5 MG PO TABS
5.0000 mg | ORAL_TABLET | Freq: Once | ORAL | Status: AC | PRN
  Administered 2017-10-30: 5 mg via ORAL

## 2017-10-30 MED ORDER — LACTATED RINGERS IV SOLN
INTRAVENOUS | Status: DC
  Administered 2017-10-30: 15:00:00 via INTRAVENOUS

## 2017-10-30 MED ORDER — FENTANYL CITRATE (PF) 100 MCG/2ML IJ SOLN
INTRAMUSCULAR | Status: DC | PRN
  Administered 2017-10-30: 25 ug via INTRAVENOUS

## 2017-10-30 MED ORDER — CHLORHEXIDINE GLUCONATE 4 % EX LIQD: 60.0000 mL | Freq: Once | CUTANEOUS | Status: DC

## 2017-10-30 MED ORDER — PROPOFOL 10 MG/ML IV BOLUS
INTRAVENOUS | Status: AC
  Filled 2017-10-30: qty 20

## 2017-10-30 MED ORDER — POVIDONE-IODINE 10 % EX SWAB: 2.0000 "application " | Freq: Once | CUTANEOUS | Status: DC

## 2017-10-30 MED ORDER — FENTANYL CITRATE (PF) 100 MCG/2ML IJ SOLN
25.0000 ug | INTRAMUSCULAR | Status: DC | PRN
  Administered 2017-10-30 (×3): 50 ug via INTRAVENOUS

## 2017-10-30 MED ORDER — DEXAMETHASONE SODIUM PHOSPHATE 10 MG/ML IJ SOLN
INTRAMUSCULAR | Status: DC | PRN
  Administered 2017-10-30: 10 mg via INTRAVENOUS

## 2017-10-30 MED ORDER — ACETAMINOPHEN 160 MG/5ML PO SOLN: 325.0000 mg | ORAL | Status: DC | PRN

## 2017-10-30 MED ORDER — ACETAMINOPHEN 10 MG/ML IV SOLN
1000.0000 mg | Freq: Once | INTRAVENOUS | Status: AC
  Administered 2017-10-30: 1000 mg via INTRAVENOUS
  Filled 2017-10-30: qty 100

## 2017-10-30 MED ORDER — MEPERIDINE HCL 50 MG/ML IJ SOLN: 6.2500 mg | INTRAMUSCULAR | Status: DC | PRN

## 2017-10-30 MED ORDER — PROPOFOL 10 MG/ML IV BOLUS
INTRAVENOUS | Status: DC | PRN
  Administered 2017-10-30: 150 mg via INTRAVENOUS

## 2017-10-30 MED ORDER — LIDOCAINE 2% (20 MG/ML) 5 ML SYRINGE
INTRAMUSCULAR | Status: AC
  Filled 2017-10-30: qty 5

## 2017-10-30 MED ORDER — OXYCODONE HCL 5 MG/5ML PO SOLN
5.0000 mg | Freq: Once | ORAL | Status: AC | PRN
  Filled 2017-10-30: qty 5

## 2017-10-30 MED ORDER — FENTANYL CITRATE (PF) 100 MCG/2ML IJ SOLN
INTRAMUSCULAR | Status: AC
  Filled 2017-10-30: qty 2

## 2017-10-30 MED ORDER — SODIUM CHLORIDE 0.9 % IV SOLN: INTRAVENOUS | Status: DC

## 2017-10-30 MED ORDER — ONDANSETRON HCL 4 MG/2ML IJ SOLN: 4.0000 mg | Freq: Once | INTRAMUSCULAR | Status: DC | PRN

## 2017-10-30 MED ORDER — OXYCODONE HCL 5 MG PO TABS
ORAL_TABLET | ORAL | Status: AC
  Filled 2017-10-30: qty 1

## 2017-10-30 MED ORDER — ONDANSETRON HCL 4 MG/2ML IJ SOLN
INTRAMUSCULAR | Status: DC | PRN
  Administered 2017-10-30: 4 mg via INTRAVENOUS

## 2017-10-30 MED ORDER — LIDOCAINE 2% (20 MG/ML) 5 ML SYRINGE
INTRAMUSCULAR | Status: DC | PRN
  Administered 2017-10-30: 100 mg via INTRAVENOUS

## 2017-10-30 MED ORDER — DEXAMETHASONE SODIUM PHOSPHATE 10 MG/ML IJ SOLN: 10.0000 mg | Freq: Once | INTRAMUSCULAR | Status: DC

## 2017-10-30 MED ORDER — KETOROLAC TROMETHAMINE 30 MG/ML IJ SOLN
INTRAMUSCULAR | Status: AC
  Filled 2017-10-30: qty 1

## 2017-10-30 MED ORDER — KETOROLAC TROMETHAMINE 30 MG/ML IJ SOLN
30.0000 mg | Freq: Once | INTRAMUSCULAR | Status: DC | PRN
  Administered 2017-10-30: 30 mg via INTRAVENOUS

## 2017-10-30 MED ORDER — ACETAMINOPHEN 325 MG PO TABS: 325.0000 mg | ORAL_TABLET | ORAL | Status: DC | PRN

## 2017-10-30 SURGICAL SUPPLY — 12 items
BANDAGE ADH SHEER 1  50/CT (GAUZE/BANDAGES/DRESSINGS) IMPLANT
COVER SURGICAL LIGHT HANDLE (MISCELLANEOUS) ×1 IMPLANT
GAUZE SPONGE 4X4 12PLY STRL (GAUZE/BANDAGES/DRESSINGS) IMPLANT
GLOVE BIO SURGEON STRL SZ8 (GLOVE) ×1 IMPLANT
GLOVE BIOGEL PI IND STRL 8 (GLOVE) ×1 IMPLANT
GLOVE BIOGEL PI INDICATOR 8 (GLOVE)
NDL SAFETY ECLIPSE 18X1.5 (NEEDLE) IMPLANT
NEEDLE HYPO 18GX1.5 SHARP (NEEDLE)
PACK ICE MAXI GEL EZY WRAP (MISCELLANEOUS) ×1 IMPLANT
SWABSTICK PVP IODINE (MISCELLANEOUS) ×1 IMPLANT
SYR CONTROL 10ML LL (SYRINGE) IMPLANT
WRAP KNEE MAXI GEL POST OP (GAUZE/BANDAGES/DRESSINGS) ×1 IMPLANT

## 2017-10-30 NOTE — Anesthesia Postprocedure Evaluation (Signed)
Anesthesia Post Note  Patient: DEANDRIA KLUTE  Procedure(s) Performed: CLOSED MANIPULATION KNEE (Left Knee)     Patient location during evaluation: PACU Anesthesia Type: General Level of consciousness: awake and alert Pain management: pain level controlled Vital Signs Assessment: post-procedure vital signs reviewed and stable Respiratory status: spontaneous breathing, nonlabored ventilation, respiratory function stable and patient connected to nasal cannula oxygen Cardiovascular status: blood pressure returned to baseline and stable Postop Assessment: no apparent nausea or vomiting Anesthetic complications: no    Last Vitals:  Vitals:   10/30/17 1416 10/30/17 1532  BP: 106/63   Pulse: 76   Resp: 16   Temp: 36.8 C 36.6 C  SpO2: 100%     Last Pain:  Vitals:   10/30/17 1532  TempSrc:   PainSc: 5                  Laithan Conchas

## 2017-10-30 NOTE — Discharge Instructions (Signed)
Resume activities as tolerated  Resume your physical therapy tomorrow  I was able to flex your knee 130 degrees  General Anesthesia, Adult, Care After These instructions provide you with information about caring for yourself after your procedure. Your health care provider may also give you more specific instructions. Your treatment has been planned according to current medical practices, but problems sometimes occur. Call your health care provider if you have any problems or questions after your procedure. What can I expect after the procedure? After the procedure, it is common to have:  Vomiting.  A sore throat.  Mental slowness.  It is common to feel:  Nauseous.  Cold or shivery.  Sleepy.  Tired.  Sore or achy, even in parts of your body where you did not have surgery.  Follow these instructions at home: For at least 24 hours after the procedure:  Do not: ? Participate in activities where you could fall or become injured. ? Drive. ? Use heavy machinery. ? Drink alcohol. ? Take sleeping pills or medicines that cause drowsiness. ? Make important decisions or sign legal documents. ? Take care of children on your own.  Rest. Eating and drinking  If you vomit, drink water, juice, or soup when you can drink without vomiting.  Drink enough fluid to keep your urine clear or pale yellow.  Make sure you have little or no nausea before eating solid foods.  Follow the diet recommended by your health care provider. General instructions  Have a responsible adult stay with you until you are awake and alert.  Return to your normal activities as told by your health care provider. Ask your health care provider what activities are safe for you.  Take over-the-counter and prescription medicines only as told by your health care provider.  If you smoke, do not smoke without supervision.  Keep all follow-up visits as told by your health care provider. This is  important. Contact a health care provider if:  You continue to have nausea or vomiting at home, and medicines are not helpful.  You cannot drink fluids or start eating again.  You cannot urinate after 8-12 hours.  You develop a skin rash.  You have fever.  You have increasing redness at the site of your procedure. Get help right away if:  You have difficulty breathing.  You have chest pain.  You have unexpected bleeding.  You feel that you are having a life-threatening or urgent problem. This information is not intended to replace advice given to you by your health care provider. Make sure you discuss any questions you have with your health care provider. Document Released: 01/02/2001 Document Revised: 02/29/2016 Document Reviewed: 09/10/2015 Elsevier Interactive Patient Education  [employer].

## 2017-10-30 NOTE — Transfer of Care (Signed)
Immediate Anesthesia Transfer of Care Note  Patient: Angela Moses  Procedure(s) Performed: CLOSED MANIPULATION KNEE (Left Knee)  Patient Location: PACU  Anesthesia Type:General  Level of Consciousness: sedated  Airway & Oxygen Therapy: Patient Spontanous Breathing and Patient connected to face mask oxygen  Post-op Assessment: Report given to RN and Post -op Vital signs reviewed and stable  Post vital signs: Reviewed and stable  Last Vitals:  Vitals:   10/30/17 1416  BP: 106/63  Pulse: 76  Resp: 16  Temp: 36.8 C  SpO2: 100%    Last Pain:  Vitals:   10/30/17 1416  TempSrc: Oral      Patients Stated Pain Goal: 4 (00/93/81 8299)  Complications: No apparent anesthesia complications

## 2017-10-30 NOTE — Anesthesia Preprocedure Evaluation (Signed)
Anesthesia Evaluation  Patient identified by MRN, date of birth, ID band Patient awake    Reviewed: Allergy & Precautions, NPO status , Patient's Chart, lab work & pertinent test results  Airway Mallampati: III  TM Distance: >3 FB Neck ROM: Full    Dental  (+) Teeth Intact, Dental Advisory Given   Pulmonary neg pulmonary ROS,    breath sounds clear to auscultation       Cardiovascular negative cardio ROS   Rhythm:Regular Rate:Normal     Neuro/Psych negative neurological ROS     GI/Hepatic negative GI ROS, Neg liver ROS,   Endo/Other  negative endocrine ROS  Renal/GU negative Renal ROS     Musculoskeletal  (+) Arthritis ,   Abdominal   Peds  Hematology negative hematology ROS (+)   Anesthesia Other Findings - HLD  Reproductive/Obstetrics                             Lab Results  Component Value Date   WBC 14.0 (H) 09/13/2017   HGB 12.3 09/13/2017   HCT 38.3 09/13/2017   MCV 92.5 09/13/2017   PLT 218 09/13/2017   EKG: normal sinus rhythm.  Anesthesia Physical  Anesthesia Plan  ASA: II  Anesthesia Plan: General   Post-op Pain Management:  Regional for Post-op pain   Induction: Intravenous  PONV Risk Score and Plan: 3 and Propofol infusion, Dexamethasone, Ondansetron, Treatment may vary due to age or medical condition and Midazolam  Airway Management Planned: LMA and Mask  Additional Equipment:   Intra-op Plan:   Post-operative Plan: Extubation in OR  Informed Consent: I have reviewed the patients History and Physical, chart, labs and discussed the procedure including the risks, benefits and alternatives for the proposed anesthesia with the patient or authorized representative who has indicated his/her understanding and acceptance.     Plan Discussed with: CRNA, Surgeon and Anesthesiologist  Anesthesia Plan Comments: ( )        Anesthesia Quick Evaluation

## 2017-10-30 NOTE — Op Note (Signed)
  OPERATIVE REPORT   PREOPERATIVE DIAGNOSIS: Arthrofibrosis, Left  knee.   POSTOPERATIVE DIAGNOSIS: Arthrofibrosis, Left knee.   PROCEDURE:  Left  knee closed manipulation.   SURGEON: Gaynelle Arabian, MD   ASSISTANT: None.   ANESTHESIA: General.   COMPLICATIONS: None.   CONDITION: Stable to Recovery.   Pre-manipulation range of motion is 0-85.  Post-manipulation range of  Motion is 0-130  PROCEDURE IN DETAIL: After successful administration of general  anesthetic, exam under anesthesia was performed showing range of motion  0-85 degrees. I then placed my chest against the proximal tibia,  flexing the knee with audible lysis of adhesions. I was easily able to  get the knee flexed to 130  degrees. I then put the knee back in extension and with some  patellar manipulation and gentle pressure got to full  Extension.The patient was subsequently awakened and transported to Recovery in  stable condition.

## 2017-10-30 NOTE — H&P (Signed)
CC- Angela Moses is a 62 y.o. female who presents with left knee pain.  HPI- . Knee Pain: Patient presents with stiffness involving the  left knee. Onset of the symptoms was several weeks ago. Inciting event: She had a left total knee arthroplasty on 09/11/17 and has done well from a pain standpoint but has significant stiffness in the knee. She has had adequate physical therapy and has not increased her range of motion in 4 weks. She can not flex her knee past 90 degrees and has signficant functional limitations due to this. She presents now for closed manipulation.  Past Medical History:  Diagnosis Date  . Arthritis   . HLD (hyperlipidemia)   . Plantar fasciitis   . Psoriasis 03/11/2015    Past Surgical History:  Procedure Laterality Date  . ABDOMINAL HYSTERECTOMY  1997  . EXCISION HAGLUND'S DEFORMITY WITH ACHILLES TENDON REPAIR Right 08/08/2013   Procedure: RIGHT EXCISION HAGLUND'S DEFORMITY  WITH ACHILLES DEBRIDEMENT AND RECONSTRUCTION;  Surgeon: Wylene Simmer, MD;  Location: [facility];  Service: Orthopedics;  Laterality: Right;  . GASTROC RECESSION EXTREMITY Right 08/08/2013   Procedure: GASTROCNEMIUS RECESSION ;  Surgeon: Wylene Simmer, MD;  Location: [facility];  Service: Orthopedics;  Laterality: Right;  . TONSILLECTOMY    . TOTAL KNEE ARTHROPLASTY Left 09/11/2017   Procedure: LEFT TOTAL KNEE ARTHROPLASTY;  Surgeon: Gaynelle Arabian, MD;  Location: [facility];  Service: Orthopedics;  Laterality: Left;  . tubes in ears     lt   . TYMPANIC MEMBRANE REPAIR    . WISDOM TOOTH EXTRACTION      Prior to Admission medications   Medication Sig Start Date End Date Taking? Authorizing Provider  atorvastatin (LIPITOR) 20 MG tablet Take 1 tablet (20 mg total) by mouth daily. Patient taking differently: Take 20 mg by mouth daily at 6 PM.  03/30/17  Yes Copland, Frederico Hamman, MD  clobetasol cream (TEMOVATE) 8.78 % Apply 1 application daily as needed topically (for psoriasis).    Yes [provider]  oxyCODONE (OXY IR/ROXICODONE) 5 MG immediate release tablet Take 1-2 tablets (5-10 mg total) by mouth every 4 (four) hours as needed for moderate pain or severe pain. Patient taking differently: Take 5 mg by mouth daily as needed for moderate pain or severe pain.  09/12/17  Yes Perkins, Alexzandrew L, PA-C  traMADol (ULTRAM) 50 MG tablet Take 1-2 tablets (50-100 mg total) by mouth every 6 (six) hours as needed for moderate pain (moderate pain unrelieved by 5mg  oxyIR). Patient taking differently: Take 50 mg by mouth every 12 (twelve) hours as needed for moderate pain (moderate pain unrelieved by 5mg  oxyIR).  09/12/17  Yes Perkins, Alexzandrew L, PA-C  gabapentin (NEURONTIN) 300 MG capsule Take 1 capsule (300 mg total) by mouth 3 (three) times daily. Gabapentin 300 mg Protocol Take a 300 mg capsule three times a day for one week, Then a 300 mg capsule twice a day for one week, Then a 300 mg capsule once a day for one week, then discontinue the Gabapentin. Patient not taking: Reported on 10/25/2017 09/12/17   Dara Lords, Alexzandrew L, PA-C  methocarbamol (ROBAXIN) 500 MG tablet Take 1 tablet (500 mg total) by mouth every 6 (six) hours as needed for muscle spasms. Patient not taking: Reported on 10/25/2017 09/12/17   Dara Lords, Alexzandrew L, PA-C  rivaroxaban (XARELTO) 10 MG TABS tablet Take 1 tablet (10 mg total) by mouth daily with breakfast. Take Xarelto for two and a half more weeks following discharge  from the hospital, then discontinue Xarelto. Once the patient has completed the blood thinner regimen, then take a Baby 81 mg Aspirin daily for three more weeks. Patient not taking: Reported on 10/25/2017 09/12/17   Dara Lords, Alexzandrew L, PA-C   KNEE EXAM antalgic gait, no warmth or effusion, reduced range of motion (0-85 degrees with firm endpoint), collateral ligaments intact  Physical Examination: General appearance - alert, well appearing, and in no distress Mental status -  alert, oriented to person, place, and time Chest - clear to auscultation, no wheezes, rales or rhonchi, symmetric air entry Heart - normal rate, regular rhythm, normal S1, S2, no murmurs, rubs, clicks or gallops Abdomen - soft, nontender, nondistended, no masses or organomegaly Neurological - alert, oriented, normal speech, no focal findings or movement disorder noted   Asessment/Plan--- Left knee arthrofibrosis- - Plan left knee closed manipulation. Procedure risks and potential comps discussed with patient who elects to proceed. Goals are decreased pain and increased function with a high likelihood of achieving both

## 2017-10-31 ENCOUNTER — Encounter (HOSPITAL_COMMUNITY): Payer: Self-pay | Admitting: Orthopedic Surgery

## 2017-10-31 DIAGNOSIS — M25662 Stiffness of left knee, not elsewhere classified: Secondary | ICD-10-CM | POA: Diagnosis not present

## 2017-11-01 DIAGNOSIS — M25662 Stiffness of left knee, not elsewhere classified: Secondary | ICD-10-CM | POA: Diagnosis not present

## 2017-11-03 DIAGNOSIS — M25662 Stiffness of left knee, not elsewhere classified: Secondary | ICD-10-CM | POA: Diagnosis not present

## 2017-11-06 DIAGNOSIS — M25662 Stiffness of left knee, not elsewhere classified: Secondary | ICD-10-CM | POA: Diagnosis not present

## 2017-11-08 DIAGNOSIS — M25662 Stiffness of left knee, not elsewhere classified: Secondary | ICD-10-CM | POA: Diagnosis not present

## 2017-11-10 DIAGNOSIS — M25662 Stiffness of left knee, not elsewhere classified: Secondary | ICD-10-CM | POA: Diagnosis not present

## 2017-11-13 DIAGNOSIS — M25662 Stiffness of left knee, not elsewhere classified: Secondary | ICD-10-CM | POA: Diagnosis not present

## 2017-11-16 DIAGNOSIS — M25662 Stiffness of left knee, not elsewhere classified: Secondary | ICD-10-CM | POA: Diagnosis not present

## 2017-11-21 DIAGNOSIS — M25662 Stiffness of left knee, not elsewhere classified: Secondary | ICD-10-CM | POA: Diagnosis not present

## 2017-11-23 DIAGNOSIS — M25662 Stiffness of left knee, not elsewhere classified: Secondary | ICD-10-CM | POA: Diagnosis not present

## 2017-11-28 DIAGNOSIS — M25662 Stiffness of left knee, not elsewhere classified: Secondary | ICD-10-CM | POA: Diagnosis not present

## 2018-03-06 DIAGNOSIS — D229 Melanocytic nevi, unspecified: Secondary | ICD-10-CM | POA: Diagnosis not present

## 2018-03-06 DIAGNOSIS — D1801 Hemangioma of skin and subcutaneous tissue: Secondary | ICD-10-CM | POA: Diagnosis not present

## 2018-03-06 DIAGNOSIS — L400 Psoriasis vulgaris: Secondary | ICD-10-CM | POA: Diagnosis not present

## 2018-03-06 DIAGNOSIS — Z85820 Personal history of malignant melanoma of skin: Secondary | ICD-10-CM | POA: Diagnosis not present

## 2018-03-15 DIAGNOSIS — M1712 Unilateral primary osteoarthritis, left knee: Secondary | ICD-10-CM | POA: Diagnosis not present

## 2018-04-21 ENCOUNTER — Other Ambulatory Visit: Payer: Self-pay | Admitting: Family Medicine

## 2018-05-22 DIAGNOSIS — H7413 Adhesive middle ear disease, bilateral: Secondary | ICD-10-CM | POA: Diagnosis not present

## 2018-05-22 DIAGNOSIS — H903 Sensorineural hearing loss, bilateral: Secondary | ICD-10-CM | POA: Diagnosis not present

## 2018-05-22 DIAGNOSIS — H6983 Other specified disorders of Eustachian tube, bilateral: Secondary | ICD-10-CM | POA: Diagnosis not present

## 2018-06-05 ENCOUNTER — Encounter: Payer: Self-pay | Admitting: Family Medicine

## 2018-06-05 NOTE — Progress Notes (Signed)
Dr. Frederico Hamman T. Ikeya Brockel, MD, [facility] [place], [address] Phone: [phone] Fax: [phone]  06/06/2018  Patient: Angela Moses, MRN: [id number], DOB: [birth year], 62 y.o.  Primary Physician:  Owens Loffler, MD   Chief Complaint  Patient presents with  . Annual Exam   Subjective:   Angela Moses is a 62 y.o. pleasant patient who presents with the following:  Health Maintenance Summary Reviewed and updated, unless pt declines services.  Tobacco History Reviewed. Non-smoker Alcohol: No concerns, no excessive use Exercise Habits: Some activity, rec at least 30 mins 5 times a week STD concerns: none Drug Use: None Birth control method: Menses regular: no Lumps or breast concerns: no Breast Cancer Family History: no   Got some arthrofibrosis after his L knee replacement. Now ROM has improved and has done well.   Health Maintenance  Topic Date Due  . INFLUENZA VACCINE  05/10/2018  . MAMMOGRAM  06/10/2019  . COLONOSCOPY  09/29/2019  . TETANUS/TDAP  10/01/2020  . Hepatitis C Screening  Completed  . HIV Screening  Completed    Immunization History  Administered Date(s) Administered  . Influenza Whole 07/10/2010, 10/01/2010  . Influenza,inj,Quad PF,6+ Mos 08/17/2015, 08/23/2017  . Td 03/10/2006, 10/01/2010   Patient Active Problem List   Diagnosis Date Noted  . Arthrofibrosis of total knee arthroplasty ([facility]) 10/30/2017  . OA (osteoarthritis) of knee 09/11/2017  . Primary osteoarthritis of both knees 03/26/2015  . Psoriasis 03/11/2015  . CERVICALGIA 12/27/2010  . VERTIGO 12/27/2010  . HYPERLIPIDEMIA 06/24/2009   Past Medical History:  Diagnosis Date  . Arthritis   . HLD (hyperlipidemia)   . Plantar fasciitis   . Psoriasis 03/11/2015   Past Surgical History:  Procedure Laterality Date  . ABDOMINAL HYSTERECTOMY  1997  . EXCISION HAGLUND'S DEFORMITY WITH ACHILLES TENDON REPAIR Right  08/08/2013   Procedure: RIGHT EXCISION HAGLUND'S DEFORMITY  WITH ACHILLES DEBRIDEMENT AND RECONSTRUCTION;  Surgeon: Wylene Simmer, MD;  Location: [facility];  Service: Orthopedics;  Laterality: Right;  . GASTROC RECESSION EXTREMITY Right 08/08/2013   Procedure: GASTROCNEMIUS RECESSION ;  Surgeon: Wylene Simmer, MD;  Location: [facility];  Service: Orthopedics;  Laterality: Right;  . KNEE CLOSED REDUCTION Left 10/30/2017   Procedure: CLOSED MANIPULATION KNEE;  Surgeon: Gaynelle Arabian, MD;  Location[facility];  Service: Orthopedics;  Laterality: Left;  . TONSILLECTOMY    . TOTAL KNEE ARTHROPLASTY Left 09/11/2017   Procedure: LEFT TOTAL KNEE ARTHROPLASTY;  Surgeon: Gaynelle Arabian, MD;  [facility];  Service: Orthopedics;  Laterality: Left;  . tubes in ears     lt   . TYMPANIC MEMBRANE REPAIR    . WISDOM TOOTH EXTRACTION     Social History   Socioeconomic History  . Marital status: Married    Spouse name: Not on file  . Number of children: Not on file  . Years of education: Not on file  . Highest education level: Not on file  Occupational History  . Not on file  Social Needs  . Financial resource strain: Not on file  . Food insecurity:    Worry: Not on file    Inability: Not on file  . Transportation needs:    Medical: Not on file    Non-medical: Not on file  Tobacco Use  . Smoking status: Never Smoker  . Smokeless tobacco: Never Used  Substance and Sexual Activity  . Alcohol use: Yes  Comment: occasional  . Drug use: No  . Sexual activity: Yes  Lifestyle  . Physical activity:    Days per week: Not on file    Minutes per session: Not on file  . Stress: Not on file  Relationships  . Social connections:    Talks on phone: Not on file    Gets together: Not on file    Attends religious service: Not on file    Active member of club or organization: Not on file    Attends meetings of clubs or organizations: Not on file    Relationship status: Not  on file  . Intimate partner violence:    Fear of current or ex partner: Not on file    Emotionally abused: Not on file    Physically abused: Not on file    Forced sexual activity: Not on file  Other Topics Concern  . Not on file  Social History Narrative  . Not on file   Family History  Problem Relation Age of Onset  . Parkinsonism Father   . Macular degeneration Father   . Dementia Mother   . Diabetes Mother   . Breast cancer Maternal Grandmother        cause of death  . Breast cancer Paternal Grandmother        cause of death  . Heart attack Maternal Grandfather        cause of death  . Heart attack Paternal Grandfather        cause of death   Allergies  Allergen Reactions  . Latex Swelling    Medication list has been reviewed and updated.   General: Denies fever, chills, sweats. No significant weight loss. Eyes: Denies blurring,significant itching ENT: Denies earache, sore throat, and hoarseness.  Cardiovascular: Denies chest pains, palpitations, dyspnea on exertion,  Respiratory: Denies cough, dyspnea at rest,wheeezing Breast: no concerns about lumps GI: Denies nausea, vomiting, diarrhea, constipation, change in bowel habits, abdominal pain, melena, hematochezia GU: Denies dysuria, hematuria, urinary hesitancy, nocturia, denies STD risk, no concerns about discharge Musculoskeletal: Denies back pain, joint pain Derm: Denies rash, itching Neuro: Denies  paresthesias, frequent falls, frequent headaches Psych: Denies depression, anxiety Endocrine: Denies cold intolerance, heat intolerance, polydipsia Heme: Denies enlarged lymph nodes Allergy: No hayfever  Objective:   BP 90/62   Pulse 79   Temp 98.3 F (36.8 C) (Oral)   Ht 5' 8.25" (1.734 m)   Wt 186 lb 12 oz (84.7 kg)   BMI 28.19 kg/m  No exam data present  GEN: well developed, well nourished, no acute distress Eyes: conjunctiva and lids normal, PERRLA, EOMI ENT: TM clear, nares clear, oral exam  WNL Neck: supple, no lymphadenopathy, no thyromegaly, no JVD Pulm: clear to auscultation and percussion, respiratory effort normal CV: regular rate and rhythm, S1-S2, no murmur, rub or gallop, no bruits Chest: no scars, masses, no lumps BREAST: breast exam declined GI: soft, non-tender; no hepatosplenomegaly, masses; active bowel sounds all quadrants GU: GU exam declined Lymph: no cervical, axillary or inguinal adenopathy MSK: gait normal, muscle tone and strength WNL, no joint swelling, effusions, discoloration, crepitus  SKIN: clear, good turgor, color WNL, no rashes, lesions, or ulcerations Neuro: normal mental status, normal strength, sensation, and motion Psych: alert; oriented to person, place and time, normally interactive and not anxious or depressed in appearance.   All labs reviewed with patient. Lipids:    Component Value Date/Time   CHOL 158 03/02/2017 1047   TRIG 120.0 03/02/2017 1047  HDL 51.00 03/02/2017 1047   LDLDIRECT 90.6 03/08/2012 0735   VLDL 24.0 03/02/2017 1047   CHOLHDL 3 03/02/2017 1047   CBC: CBC Latest Ref Rng & Units 10/30/2017 09/13/2017 09/12/2017  WBC 4.0 - 10.5 K/uL 7.2 14.0(H) 17.8(H)  Hemoglobin 12.0 - 15.0 g/dL 14.1 12.3 12.0  Hematocrit 36.0 - 46.0 % 43.1 38.3 36.4  Platelets 150 - 400 K/uL 270 218 295    Basic Metabolic Panel:    Component Value Date/Time   NA 140 09/13/2017 0558   K 3.6 09/13/2017 0558   CL 103 09/13/2017 0558   CO2 30 09/13/2017 0558   BUN 12 09/13/2017 0558   CREATININE 0.93 09/13/2017 0558   GLUCOSE 102 (H) 09/13/2017 0558   CALCIUM 9.1 09/13/2017 0558   Hepatic Function Latest Ref Rng & Units 09/06/2017 03/02/2017 10/16/2015  Total Protein 6.5 - 8.1 g/dL 6.6 6.6 6.6  Albumin 3.5 - 5.0 g/dL 3.8 4.0 3.9  AST 15 - 41 U/L '20 20 24  ' ALT 14 - 54 U/L 21 20 37(H)  Alk Phosphatase 38 - 126 U/L 90 86 107  Total Bilirubin 0.3 - 1.2 mg/dL 0.7 0.4 0.4  Bilirubin, Direct 0.0 - 0.3 mg/dL - 0.1 0.0    Lab Results   Component Value Date   TSH 1.72 03/02/2017   No results found.  Assessment and Plan:   Healthcare maintenance - Plan: Basic metabolic panel, CBC with Differential/Platelet, Hepatic function panel, Lipid panel, TSH  Doing well globally Check labs today  Health Maintenance Exam: The patient's preventative maintenance and recommended screening tests for an annual wellness exam were reviewed in full today. Brought up to date unless services declined.  Counselled on the importance of diet, exercise, and its role in overall health and mortality. The patient's FH and SH was reviewed, including their home life, tobacco status, and drug and alcohol status.  Follow-up in 1 year for physical exam or additional follow-up below.  Follow-up: No follow-ups on file. Or follow-up in 1 year if not noted.  Signed,  Maud Deed. Analy Bassford, MD   Allergies as of 06/06/2018      Reactions   Latex Swelling      Medication List        Accurate as of 06/06/18  1:51 PM. Always use your most recent med list.          atorvastatin 20 MG tablet Commonly known as:  LIPITOR Take 1 tablet (20 mg total) by mouth daily.   clobetasol cream 0.05 % Commonly known as:  TEMOVATE Apply 1 application daily as needed topically (for psoriasis).

## 2018-06-06 ENCOUNTER — Encounter: Payer: Self-pay | Admitting: Family Medicine

## 2018-06-06 ENCOUNTER — Ambulatory Visit (INDEPENDENT_AMBULATORY_CARE_PROVIDER_SITE_OTHER): Payer: 59 | Admitting: Family Medicine

## 2018-06-06 VITALS — BP 90/62 | HR 79 | Temp 98.3°F | Ht 68.25 in | Wt 186.8 lb

## 2018-06-06 DIAGNOSIS — Z0000 Encounter for general adult medical examination without abnormal findings: Secondary | ICD-10-CM

## 2018-06-06 LAB — BASIC METABOLIC PANEL
BUN: 10 mg/dL (ref 6–23)
CHLORIDE: 104 meq/L (ref 96–112)
CO2: 29 meq/L (ref 19–32)
Calcium: 9.8 mg/dL (ref 8.4–10.5)
Creatinine, Ser: 1.04 mg/dL (ref 0.40–1.20)
GFR: 57.48 mL/min — ABNORMAL LOW (ref >60.00)
GLUCOSE: 93 mg/dL (ref 70–99)
POTASSIUM: 4.4 meq/L (ref 3.5–5.1)
SODIUM: 142 meq/L (ref 135–145)

## 2018-06-06 LAB — CBC WITH DIFFERENTIAL/PLATELET
Basophils Absolute: 0.1 10*3/uL (ref 0.0–0.1)
Basophils Relative: 0.7 % (ref 0.0–3.0)
Eosinophils Absolute: 0.4 10*3/uL (ref 0.0–0.7)
Eosinophils Relative: 4.9 % (ref 0.0–5.0)
HCT: 40.5 % (ref 36.0–46.0)
Hemoglobin: 13.4 g/dL (ref 12.0–15.0)
Lymphocytes Relative: 22.9 % (ref 12.0–46.0)
Lymphs Abs: 1.7 10*3/uL (ref 0.7–4.0)
MCHC: 33 g/dL (ref 30.0–36.0)
MCV: 85.8 fl (ref 78.0–100.0)
Monocytes Absolute: 0.7 10*3/uL (ref 0.1–1.0)
Monocytes Relative: 9.4 % (ref 3.0–12.0)
Neutro Abs: 4.7 10*3/uL (ref 1.4–7.7)
Neutrophils Relative %: 62.1 % (ref 43.0–77.0)
Platelets: 243 10*3/uL (ref 150.0–400.0)
RBC: 4.72 Mil/uL (ref 3.87–5.11)
RDW: 14.4 % (ref 11.5–15.5)
WBC: 7.6 10*3/uL (ref 4.0–10.5)

## 2018-06-06 LAB — LIPID PANEL
CHOLESTEROL: 169 mg/dL (ref 0–200)
HDL: 58.9 mg/dL (ref >39.00)
LDL Cholesterol: 83 mg/dL (ref 0–99)
NONHDL: 109.66
Total CHOL/HDL Ratio: 3
Triglycerides: 135 mg/dL (ref 0.0–149.0)
VLDL: 27 mg/dL (ref 0.0–40.0)

## 2018-06-06 LAB — HEPATIC FUNCTION PANEL
ALBUMIN: 4.3 g/dL (ref 3.5–5.2)
ALT: 16 U/L (ref 0–35)
AST: 17 U/L (ref 0–37)
Alkaline Phosphatase: 119 U/L — ABNORMAL HIGH (ref 39–117)
Bilirubin, Direct: 0.1 mg/dL (ref 0.0–0.3)
TOTAL PROTEIN: 7.4 g/dL (ref 6.0–8.3)
Total Bilirubin: 0.7 mg/dL (ref 0.2–1.2)

## 2018-06-06 LAB — TSH: TSH: 1.99 u[IU]/mL (ref 0.35–4.50)

## 2018-06-06 MED ORDER — ATORVASTATIN CALCIUM 20 MG PO TABS: 20.0000 mg | ORAL_TABLET | Freq: Every day | ORAL | 3 refills | Status: DC

## 2018-06-13 ENCOUNTER — Encounter: Payer: Self-pay | Admitting: Family Medicine

## 2018-06-13 DIAGNOSIS — Z1231 Encounter for screening mammogram for malignant neoplasm of breast: Secondary | ICD-10-CM | POA: Diagnosis not present

## 2018-06-14 ENCOUNTER — Encounter: Payer: Self-pay | Admitting: *Deleted

## 2018-06-19 DIAGNOSIS — Z01419 Encounter for gynecological examination (general) (routine) without abnormal findings: Secondary | ICD-10-CM | POA: Diagnosis not present

## 2018-06-19 DIAGNOSIS — Z6829 Body mass index (BMI) 29.0-29.9, adult: Secondary | ICD-10-CM | POA: Diagnosis not present

## 2018-08-13 ENCOUNTER — Encounter: Payer: Self-pay | Admitting: Sports Medicine

## 2018-08-13 ENCOUNTER — Ambulatory Visit: Payer: Self-pay

## 2018-08-13 ENCOUNTER — Ambulatory Visit: Payer: 59 | Admitting: Sports Medicine

## 2018-08-13 VITALS — BP 112/68 | HR 87 | Ht 68.25 in | Wt 201.0 lb

## 2018-08-13 DIAGNOSIS — M1712 Unilateral primary osteoarthritis, left knee: Secondary | ICD-10-CM | POA: Diagnosis not present

## 2018-08-13 DIAGNOSIS — M25561 Pain in right knee: Secondary | ICD-10-CM

## 2018-08-13 DIAGNOSIS — G8929 Other chronic pain: Secondary | ICD-10-CM | POA: Diagnosis not present

## 2018-08-13 DIAGNOSIS — T8482XS Fibrosis due to internal orthopedic prosthetic devices, implants and grafts, sequela: Secondary | ICD-10-CM | POA: Diagnosis not present

## 2018-08-13 NOTE — Progress Notes (Signed)
Angela Moses. [place], [place] at [place]  Angela Moses - 62 y.o. female MRN [id number]  Date of birth: [birth year]  Visit Date: 08/13/2018  PCP: Angela Loffler, MD   Referred by: Angela Loffler, MD   Scribe(s) for today's visit: Wendy Poet, LAT, ATC  SUBJECTIVE:  Angela Moses is here for New Patient (Initial Visit) (R knee pain)    HPI: Her R knee symptoms INITIALLY: Began years ago and she's had OA and degeneration in both knees.  She states that her R knee started flaring up on Friday and has been experiencing pain and swelling at her R medial knee. Described as moderate sharp pain w/ weight bearing, nonradiating Worsened with weight bearing  Improved with nothing noted Additional associated symptoms include: R knee swelling; no mechanical symptoms noted    At this time symptoms show no change compared to onset  She has been  Taking IBU prn and using ice.  Hx of complicated L knee TKA - 09/11/17  B knee XR - 03/25/15  REVIEW OF SYSTEMS:  Denies night time disturbances. Denies fevers, chills, or night sweats. Denies unexplained weight loss. Denies personal history of cancer. Denies changes in bowel or bladder habits. Denies recent unreported falls. Denies new or worsening dyspnea or wheezing. Denies headaches or dizziness.  Denies numbness, tingling or weakness  In the extremities.  Denies dizziness or presyncopal episodes Reports lower extremity edema- intermittent ankle swelling and L knee swelling   HISTORY:  Prior history reviewed and updated per electronic medical record.  Social History   Occupational History  . Not on file  Tobacco Use  . Smoking status: Never Smoker  . Smokeless tobacco: Never Used  Substance and Sexual Activity  . Alcohol use: Yes    Comment: occasional  . Drug use: No  . Sexual activity: Yes   Social History   Social History Narrative  . Not on file    Past Medical History:  Diagnosis Date  . Arthritis   . HLD (hyperlipidemia)   . Plantar fasciitis   . Psoriasis 03/11/2015   Past Surgical History:  Procedure Laterality Date  . ABDOMINAL HYSTERECTOMY  1997  . EXCISION HAGLUND'S DEFORMITY WITH ACHILLES TENDON REPAIR Right 08/08/2013   Procedure: RIGHT EXCISION HAGLUND'S DEFORMITY  WITH ACHILLES DEBRIDEMENT AND RECONSTRUCTION;  Surgeon: Wylene Simmer, MD;  Location: [facility];  Service: Orthopedics;  Laterality: Right;  . GASTROC RECESSION EXTREMITY Right 08/08/2013   Procedure: GASTROCNEMIUS RECESSION ;  Surgeon: Wylene Simmer, MD;  Location: [facility];  Service: Orthopedics;  Laterality: Right;  . KNEE CLOSED REDUCTION Left 10/30/2017   Procedure: CLOSED MANIPULATION KNEE;  Surgeon: Gaynelle Arabian, MD;  Location[facility];  Service: Orthopedics;  Laterality: Left;  . TONSILLECTOMY    . TOTAL KNEE ARTHROPLASTY Left 09/11/2017   Procedure: LEFT TOTAL KNEE ARTHROPLASTY;  Surgeon: Gaynelle Arabian, MD;  [facility];  Service: Orthopedics;  Laterality: Left;  . tubes in ears     lt   . TYMPANIC MEMBRANE REPAIR    . WISDOM TOOTH EXTRACTION     family history includes Breast cancer in her maternal grandmother and paternal grandmother; Dementia in her mother; Diabetes in her mother; Heart attack in her maternal grandfather and paternal grandfather; Macular degeneration in her father; Parkinsonism in her father.  DATA OBTAINED & REVIEWED:   Recent Labs    06/06/18 1115  CALCIUM 9.8  AST 17  ALT  16  TSH 1.99   No problems updated. No specialty comments available.   OBJECTIVE:  VS:  HT:5' 8.25" (173.4 cm)   WT:201 lb (91.2 kg)  BMI:30.32    BP:112/68  HR:87bpm  TEMP: ( )  RESP:97 %   PHYSICAL EXAM: CONSTITUTIONAL: Well-developed, Well-nourished and In no acute distress EYES: Pupils are equal., EOM intact without nystagmus. and No scleral icterus. Psychiatric: Alert & appropriately interactive.  and Not depressed or anxious appearing. EXTREMITY EXAM: Warm and well perfused  Right knee with well-healed midline incision.  She has limitations in her terminal flexion extension. Left knee has generalized osteoarthritic bossing with moderate effusion and moderate synovitis.  She is ligamentously stable.  Pain with patellar grind.  Extensor mechanism intact.   ASSESSMENT   1. Chronic pain of right knee   2. Arthrofibrosis of total knee replacement, sequela   3. Primary osteoarthritis of left knee      PROCEDURES:  US Guided Injection per procedure note      PLAN:  Pertinent additional documentation may be included in corresponding procedure notes, imaging studies, problem based documentation and patient instructions.  No problem-specific Assessment & Plan notes found for this encounter.  She should respond well to the injection today.  She is a candidate for total knee arthroplasty but does not want to do this given the poor outcome from her last injection.  I am happy to see her back at any time for repeat injections versus Visco supplementation versus Zilretta injections.   Activity modifications and the importance of avoiding exacerbating activities (limiting pain to no more than a 4 / 10 during or following activity) recommended and discussed.  Discussed red flag symptoms that warrant earlier emergent evaluation and patient voices understanding.   No orders of the defined types were placed in this encounter.  Lab Orders  No laboratory test(s) ordered today   Imaging Orders     [place] [facility] POCT ULTRASOUND Referral Orders  No referral(s) requested today    At follow up will plan to consider: repeat corticosteroid injections  Return if symptoms worsen or fail to improve.     CMA/ATC served as Education administrator during this visit. History, Physical, and Plan performed by medical provider. Documentation and orders reviewed and attested to.      Gerda Diss, [place]  Sports Medicine Physician

## 2018-08-13 NOTE — Procedures (Signed)
PROCEDURE NOTE:  Ultrasound Guided: Injection: Right knee Images were obtained and interpreted by myself, Teresa Coombs, DO  Images have been saved and stored to PACS system. Images obtained on: GE S7 Ultrasound machine    ULTRASOUND FINDINGS:  Moderate synovitis.  Small effusion.  DESCRIPTION OF PROCEDURE:  The patient's clinical condition is marked by substantial pain and/or significant functional disability. Other conservative therapy has not provided relief, is contraindicated, or not appropriate. There is a reasonable likelihood that injection will significantly improve the patient's pain and/or functional impairment.   After discussing the risks, benefits and expected outcomes of the injection and all questions were reviewed and answered, the patient wished to undergo the above named procedure.  Verbal consent was obtained.  The ultrasound was used to identify the target structure and adjacent neurovascular structures. The skin was then prepped in sterile fashion and the target structure was injected under direct visualization using sterile technique as below:  Single injection performed as below: PREP: Alcohol and Ethel Chloride APPROACH:superiolateral, single injection, 25g 1.5 in. INJECTATE: 2 cc 0.5% Marcaine and 2 cc 40mg /mL DepoMedrol ASPIRATE: None DRESSING: Band-Aid  Post procedural instructions including recommending icing and warning signs for infection were reviewed.    This procedure was well tolerated and there were no complications.   IMPRESSION: Succesful Ultrasound Guided: Injection

## 2018-08-13 NOTE — Patient Instructions (Addendum)
You had an injection today.  Things to be aware of after injection are listed below: . You may experience no significant improvement or even a slight worsening in your symptoms during the first 24 to 48 hours.  After that we expect your symptoms to improve gradually over the next 2 weeks for the medicine to have its maximal effect.  You should continue to have improvement out to 6 weeks after your injection. . Dr. Paulla Fore recommends icing the site of the injection for 20 minutes  1-2 times the day of your injection . You may shower but no swimming, tub bath or Jacuzzi for 24 hours. . If your bandage falls off this does not need to be replaced.  It is appropriate to remove the bandage after 4 hours. . You may resume light activities as tolerated unless otherwise directed per Dr. Paulla Fore during your visit  POSSIBLE STEROID SIDE EFFECTS:  Side effects from injectable steroids tend to be less than when taken orally however you may experience some of the symptoms listed below.  If experienced these should only last for a short period of time. Change in menstrual flow  Edema (swelling)  Increased appetite Skin flushing (redness)  Skin rash/acne  Thrush (oral) Yeast vaginitis    Increased sweating  Depression Increased blood glucose levels Cramping and leg/calf  Euphoria (feeling happy)  POSSIBLE PROCEDURE SIDE EFFECTS: The side effects of the injection are usually fairly minimal however if you may experience some of the following side effects that are usually self-limited and will is off on their own.  If you are concerned please feel free to call the office with questions:  Increased numbness or tingling  Nausea or vomiting  Swelling or bruising at the injection site   Please call our office if if you experience any of the following symptoms over the next week as these can be signs of infection:   Fever greater than 100.10F  Significant swelling at the injection site  Significant redness or drainage  from the injection site  If after 2 weeks you are continuing to have worsening symptoms please call our office to discuss what the next appropriate actions should be including the potential for a return office visit or other diagnostic testing.   I recommend you obtained a compression sleeve to help with your joint problems. There are many options on the market however I recommend obtaining a full knee Body Helix compression sleeve.  You can find information (including how to appropriate measure yourself for sizing) can be found at www.Body http://www.lambert.com/.  Many of these products are health savings account (HSA) eligible.   You can use the compression sleeve at any time throughout the day but is most important to use while being active as well as for 2 hours post-activity.   It is appropriate to ice following activity with the compression sleeve in place.

## 2018-08-31 DIAGNOSIS — M25561 Pain in right knee: Secondary | ICD-10-CM | POA: Diagnosis not present

## 2018-09-10 DIAGNOSIS — L570 Actinic keratosis: Secondary | ICD-10-CM | POA: Diagnosis not present

## 2018-09-10 DIAGNOSIS — L821 Other seborrheic keratosis: Secondary | ICD-10-CM | POA: Diagnosis not present

## 2018-09-10 DIAGNOSIS — D229 Melanocytic nevi, unspecified: Secondary | ICD-10-CM | POA: Diagnosis not present

## 2018-09-10 DIAGNOSIS — L814 Other melanin hyperpigmentation: Secondary | ICD-10-CM | POA: Diagnosis not present

## 2018-10-12 DIAGNOSIS — M25562 Pain in left knee: Secondary | ICD-10-CM | POA: Diagnosis not present

## 2018-10-24 DIAGNOSIS — H01021 Squamous blepharitis right upper eyelid: Secondary | ICD-10-CM | POA: Diagnosis not present

## 2018-10-24 DIAGNOSIS — H2513 Age-related nuclear cataract, bilateral: Secondary | ICD-10-CM | POA: Diagnosis not present

## 2018-10-24 DIAGNOSIS — H01022 Squamous blepharitis right lower eyelid: Secondary | ICD-10-CM | POA: Diagnosis not present

## 2018-11-03 ENCOUNTER — Encounter: Payer: Self-pay | Admitting: Sports Medicine

## 2019-06-03 ENCOUNTER — Telehealth: Payer: Self-pay | Admitting: *Deleted

## 2019-06-03 MED ORDER — ATORVASTATIN CALCIUM 20 MG PO TABS: 20.0000 mg | ORAL_TABLET | Freq: Every day | ORAL | 0 refills | Status: DC

## 2019-06-03 NOTE — Telephone Encounter (Signed)
Please schedule CPE with fasting labs with Dr. Copland. 

## 2019-06-05 NOTE — Telephone Encounter (Signed)
Left message asking pt to call office  °

## 2019-06-11 NOTE — Telephone Encounter (Signed)
Labs 10/15 cpx 10/19 Pt aware

## 2019-06-18 ENCOUNTER — Encounter: Payer: Self-pay | Admitting: Family Medicine

## 2019-07-16 ENCOUNTER — Other Ambulatory Visit: Payer: Self-pay | Admitting: Family Medicine

## 2019-07-16 DIAGNOSIS — Z79899 Other long term (current) drug therapy: Secondary | ICD-10-CM

## 2019-07-16 DIAGNOSIS — E785 Hyperlipidemia, unspecified: Secondary | ICD-10-CM

## 2019-07-16 DIAGNOSIS — R7303 Prediabetes: Secondary | ICD-10-CM

## 2019-07-16 DIAGNOSIS — Z131 Encounter for screening for diabetes mellitus: Secondary | ICD-10-CM

## 2019-07-25 ENCOUNTER — Other Ambulatory Visit (INDEPENDENT_AMBULATORY_CARE_PROVIDER_SITE_OTHER): Payer: 59

## 2019-07-25 ENCOUNTER — Other Ambulatory Visit: Payer: Self-pay

## 2019-07-25 DIAGNOSIS — E785 Hyperlipidemia, unspecified: Secondary | ICD-10-CM | POA: Diagnosis not present

## 2019-07-25 DIAGNOSIS — Z79899 Other long term (current) drug therapy: Secondary | ICD-10-CM

## 2019-07-25 DIAGNOSIS — Z131 Encounter for screening for diabetes mellitus: Secondary | ICD-10-CM | POA: Diagnosis not present

## 2019-07-25 LAB — CBC WITH DIFFERENTIAL/PLATELET
Basophils Absolute: 0.1 10*3/uL (ref 0.0–0.1)
Basophils Relative: 0.8 % (ref 0.0–3.0)
Eosinophils Absolute: 0.4 10*3/uL (ref 0.0–0.7)
Eosinophils Relative: 6.6 % — ABNORMAL HIGH (ref 0.0–5.0)
HCT: 42.1 % (ref 36.0–46.0)
Hemoglobin: 13.8 g/dL (ref 12.0–15.0)
Lymphocytes Relative: 26.2 % (ref 12.0–46.0)
Lymphs Abs: 1.6 10*3/uL (ref 0.7–4.0)
MCHC: 32.7 g/dL (ref 30.0–36.0)
MCV: 89.6 fl (ref 78.0–100.0)
Monocytes Absolute: 0.5 10*3/uL (ref 0.1–1.0)
Monocytes Relative: 8.6 % (ref 3.0–12.0)
Neutro Abs: 3.6 10*3/uL (ref 1.4–7.7)
Neutrophils Relative %: 57.8 % (ref 43.0–77.0)
Platelets: 253 10*3/uL (ref 150.0–400.0)
RBC: 4.7 Mil/uL (ref 3.87–5.11)
RDW: 15.3 % (ref 11.5–15.5)
WBC: 6.3 10*3/uL (ref 4.0–10.5)

## 2019-07-25 LAB — HEPATIC FUNCTION PANEL
ALT: 17 U/L (ref 0–35)
AST: 14 U/L (ref 0–37)
Albumin: 4.1 g/dL (ref 3.5–5.2)
Alkaline Phosphatase: 106 U/L (ref 39–117)
Bilirubin, Direct: 0.1 mg/dL (ref 0.0–0.3)
Total Bilirubin: 0.5 mg/dL (ref 0.2–1.2)
Total Protein: 6.7 g/dL (ref 6.0–8.3)

## 2019-07-25 LAB — LIPID PANEL
Cholesterol: 178 mg/dL (ref 0–200)
HDL: 53.1 mg/dL (ref >39.00)
LDL Cholesterol: 90 mg/dL (ref 0–99)
NonHDL: 125.23
Total CHOL/HDL Ratio: 3
Triglycerides: 175 mg/dL — ABNORMAL HIGH (ref 0.0–149.0)
VLDL: 35 mg/dL (ref 0.0–40.0)

## 2019-07-25 LAB — BASIC METABOLIC PANEL
BUN: 14 mg/dL (ref 6–23)
CO2: 29 mEq/L (ref 19–32)
Calcium: 9.3 mg/dL (ref 8.4–10.5)
Chloride: 105 mEq/L (ref 96–112)
Creatinine, Ser: 1.06 mg/dL (ref 0.40–1.20)
GFR: 52.7 mL/min — ABNORMAL LOW (ref >60.00)
Glucose, Bld: 95 mg/dL (ref 70–99)
Potassium: 3.9 mEq/L (ref 3.5–5.1)
Sodium: 140 mEq/L (ref 135–145)

## 2019-07-25 LAB — HEMOGLOBIN A1C: Hgb A1c MFr Bld: 5.9 % (ref 4.6–6.5)

## 2019-07-25 LAB — TSH: TSH: 2.36 u[IU]/mL (ref 0.35–4.50)

## 2019-07-29 ENCOUNTER — Ambulatory Visit (INDEPENDENT_AMBULATORY_CARE_PROVIDER_SITE_OTHER): Payer: 59 | Admitting: Family Medicine

## 2019-07-29 ENCOUNTER — Other Ambulatory Visit: Payer: Self-pay

## 2019-07-29 ENCOUNTER — Encounter: Payer: Self-pay | Admitting: Family Medicine

## 2019-07-29 VITALS — BP 100/60 | HR 75 | Temp 98.2°F | Ht 68.0 in | Wt 203.8 lb

## 2019-07-29 DIAGNOSIS — Z0000 Encounter for general adult medical examination without abnormal findings: Secondary | ICD-10-CM | POA: Diagnosis not present

## 2019-07-29 DIAGNOSIS — Z23 Encounter for immunization: Secondary | ICD-10-CM | POA: Diagnosis not present

## 2019-07-29 DIAGNOSIS — Z01818 Encounter for other preprocedural examination: Secondary | ICD-10-CM | POA: Diagnosis not present

## 2019-07-29 NOTE — Progress Notes (Signed)
Angela Moses T. Briar Witherspoon, MD Primary Care and [place] at [facility] [place], [address] Phone: [phone]  FAX: [place] - 63 y.o. female  MRN [id number]  Date of Birth: 1956-08-17  Visit Date: 07/29/2019  PCP: Owens Loffler, MD  Referred by: Owens Loffler, MD  Chief Complaint  Patient presents with  . Annual Exam   Patient Care Team: Owens Loffler, MD as PCP - General Subjective:   Angela Moses is a 63 y.o. pleasant patient who presents with the following:  Health Maintenance Summary Reviewed and updated, unless pt declines services.  Tobacco History Reviewed. Non-smoker Alcohol: No concerns, no excessive use Exercise Habits: Some activity, rec at least 30 mins 5 times a week STD concerns: none Drug Use: None Birth control method: hyst Menses regular: hyst Lumps or breast concerns: no Breast Cancer Family History: GM x 2  Upcoming colon. DEXa at gyn  Son had Covid-19  Health Maintenance  Topic Date Due  . COLONOSCOPY  09/29/2019  . TETANUS/TDAP  10/01/2020  . MAMMOGRAM  06/17/2021  . INFLUENZA VACCINE  Completed  . Hepatitis C Screening  Completed  . HIV Screening  Completed    Immunization History  Administered Date(s) Administered  . Influenza Whole 07/10/2010, 10/01/2010  . Influenza,inj,Quad PF,6+ Mos 08/17/2015, 08/23/2017, 07/29/2019  . Td 03/10/2006, 10/01/2010   Patient Active Problem List   Diagnosis Date Noted  . Arthrofibrosis of total knee arthroplasty ([place]) 10/30/2017  . OA (osteoarthritis) of knee 09/11/2017  . Primary osteoarthritis of both knees 03/26/2015  . Psoriasis 03/11/2015  . CERVICALGIA 12/27/2010  . VERTIGO 12/27/2010  . HYPERLIPIDEMIA 06/24/2009   Past Medical History:  Diagnosis Date  . Arthritis   . HLD (hyperlipidemia)   . Plantar fasciitis   . Psoriasis 03/11/2015   Past Surgical History:  Procedure Laterality Date  .  ABDOMINAL HYSTERECTOMY  1997  . EXCISION HAGLUND'S DEFORMITY WITH ACHILLES TENDON REPAIR Right 08/08/2013   Procedure: RIGHT EXCISION HAGLUND'S DEFORMITY  WITH ACHILLES DEBRIDEMENT AND RECONSTRUCTION;  Surgeon: Wylene Simmer, MD;  Location: [place];  Service: Orthopedics;  Laterality: Right;  . GASTROC RECESSION EXTREMITY Right 08/08/2013   Procedure: GASTROCNEMIUS RECESSION ;  Surgeon: Wylene Simmer, MD;  Location: [place];  Service: Orthopedics;  Laterality: Right;  . KNEE CLOSED REDUCTION Left 10/30/2017   Procedure: CLOSED MANIPULATION KNEE;  Surgeon: Gaynelle Arabian, MD;  Location: [facility];  Service: Orthopedics;  Laterality: Left;  . TONSILLECTOMY    . TOTAL KNEE ARTHROPLASTY Left 09/11/2017   Procedure: LEFT TOTAL KNEE ARTHROPLASTY;  Surgeon: Gaynelle Arabian, MD;  Location: [facility];  Service: Orthopedics;  Laterality: Left;  . tubes in ears     lt   . TYMPANIC MEMBRANE REPAIR    . WISDOM TOOTH EXTRACTION     Social History   Socioeconomic History  . Marital status: Married    Spouse name: Not on file  . Number of children: Not on file  . Years of education: Not on file  . Highest education level: Not on file  Occupational History  . Not on file  Social Needs  . Financial resource strain: Not on file  . Food insecurity    Worry: Not on file    Inability: Not on file  . Transportation needs    Medical: Not on file    Non-medical: Not on file  Tobacco Use  . Smoking status: Never Smoker  .  Smokeless tobacco: Never Used  Substance and Sexual Activity  . Alcohol use: Yes    Comment: occasional  . Drug use: No  . Sexual activity: Yes  Lifestyle  . Physical activity    Days per week: Not on file    Minutes per session: Not on file  . Stress: Not on file  Relationships  . Social Herbalist on phone: Not on file    Gets together: Not on file    Attends religious service: Not on file    Active member of club or organization: Not on  file    Attends meetings of clubs or organizations: Not on file    Relationship status: Not on file  . Intimate partner violence    Fear of current or ex partner: Not on file    Emotionally abused: Not on file    Physically abused: Not on file    Forced sexual activity: Not on file  Other Topics Concern  . Not on file  Social History Narrative  . Not on file   Family History  Problem Relation Age of Onset  . Parkinsonism Father   . Macular degeneration Father   . Dementia Mother   . Diabetes Mother   . Breast cancer Maternal Grandmother        cause of death  . Breast cancer Paternal Grandmother        cause of death  . Heart attack Maternal Grandfather        cause of death  . Heart attack Paternal Grandfather        cause of death   Allergies  Allergen Reactions  . Latex Swelling    Medication list has been reviewed and updated.   General: Denies fever, chills, sweats. No significant weight loss. Eyes: Denies blurring,significant itching ENT: Denies earache, sore throat, and hoarseness.  Cardiovascular: Denies chest pains, palpitations, dyspnea on exertion,  Respiratory: Denies cough, dyspnea at rest,wheeezing Breast: no concerns about lumps GI: Denies nausea, vomiting, diarrhea, constipation, change in bowel habits, abdominal pain, melena, hematochezia GU: Denies dysuria, hematuria, urinary hesitancy, nocturia, denies STD risk, no concerns about discharge Musculoskeletal: Denies back pain, joint pain Derm: Denies rash, itching Neuro: Denies  paresthesias, frequent falls, frequent headaches Psych: Denies depression, anxiety Endocrine: Denies cold intolerance, heat intolerance, polydipsia Heme: Denies enlarged lymph nodes Allergy: No hayfever  Objective:   BP 100/60   Pulse 75   Temp 98.2 F (36.8 C) (Temporal)   Ht 5\' 8"  (1.727 m)   Wt 203 lb 12 oz (92.4 kg)   SpO2 96%   BMI 30.98 kg/m  Ideal Body Weight: Weight in (lb) to have BMI = 25: 164.1 No exam  data present Depression screen [facility] 2/9 07/29/2019 06/06/2018  Decreased Interest 0 0  Down, Depressed, Hopeless 0 0  PHQ - 2 Score 0 0     GEN: well developed, well nourished, no acute distress Eyes: conjunctiva and lids normal, PERRLA, EOMI ENT: TM clear, nares clear, oral exam WNL Neck: supple, no lymphadenopathy, no thyromegaly, no JVD Pulm: clear to auscultation and percussion, respiratory effort normal CV: regular rate and rhythm, S1-S2, no murmur, rub or gallop, no bruits Chest: no scars, masses, no lumps BREAST: breast exam declined GI: soft, non-tender; no hepatosplenomegaly, masses; active bowel sounds all quadrants GU: GU exam declined Lymph: no cervical, axillary or inguinal adenopathy MSK: gait normal, muscle tone and strength WNL, no joint swelling, effusions, discoloration, crepitus  SKIN: clear, good turgor,  color WNL, no rashes, lesions, or ulcerations Neuro: normal mental status, normal strength, sensation, and motion Psych: alert; oriented to person, place and time, normally interactive and not anxious or depressed in appearance.   All labs reviewed with patient. Results for orders placed or performed in visit on 07/25/19  TSH  Result Value Ref Range   TSH 2.36 0.35 - 4.50 uIU/mL  Basic metabolic panel  Result Value Ref Range   Sodium 140 135 - 145 mEq/L   Potassium 3.9 3.5 - 5.1 mEq/L   Chloride 105 96 - 112 mEq/L   CO2 29 19 - 32 mEq/L   Glucose, Bld 95 70 - 99 mg/dL   BUN 14 6 - 23 mg/dL   Creatinine, Ser 1.06 0.40 - 1.20 mg/dL   Calcium 9.3 8.4 - 10.5 mg/dL   GFR 52.70 (L) >60.00 mL/min  Hepatic function panel  Result Value Ref Range   Total Bilirubin 0.5 0.2 - 1.2 mg/dL   Bilirubin, Direct 0.1 0.0 - 0.3 mg/dL   Alkaline Phosphatase 106 39 - 117 U/L   AST 14 0 - 37 U/L   ALT 17 0 - 35 U/L   Total Protein 6.7 6.0 - 8.3 g/dL   Albumin 4.1 3.5 - 5.2 g/dL  CBC with Differential/Platelet  Result Value Ref Range   WBC 6.3 4.0 - 10.5 K/uL   RBC 4.70  3.87 - 5.11 Mil/uL   Hemoglobin 13.8 12.0 - 15.0 g/dL   HCT 42.1 36.0 - 46.0 %   MCV 89.6 78.0 - 100.0 fl   MCHC 32.7 30.0 - 36.0 g/dL   RDW 15.3 11.5 - 15.5 %   Platelets 253.0 150.0 - 400.0 K/uL   Neutrophils Relative % 57.8 43.0 - 77.0 %   Lymphocytes Relative 26.2 12.0 - 46.0 %   Monocytes Relative 8.6 3.0 - 12.0 %   Eosinophils Relative 6.6 (H) 0.0 - 5.0 %   Basophils Relative 0.8 0.0 - 3.0 %   Neutro Abs 3.6 1.4 - 7.7 K/uL   Lymphs Abs 1.6 0.7 - 4.0 K/uL   Monocytes Absolute 0.5 0.1 - 1.0 K/uL   Eosinophils Absolute 0.4 0.0 - 0.7 K/uL   Basophils Absolute 0.1 0.0 - 0.1 K/uL  Hemoglobin A1c  Result Value Ref Range   Hgb A1c MFr Bld 5.9 4.6 - 6.5 %  Lipid panel  Result Value Ref Range   Cholesterol 178 0 - 200 mg/dL   Triglycerides 175.0 (H) 0.0 - 149.0 mg/dL   HDL 53.10 >39.00 mg/dL   VLDL 35.0 0.0 - 40.0 mg/dL   LDL Cholesterol 90 0 - 99 mg/dL   Total CHOL/HDL Ratio 3    NonHDL 125.23    No results found.  Assessment and Plan:     ICD-10-CM   1. Healthcare maintenance  Z00.00   2. Need for influenza vaccination  Z23 Flu Vaccine QUAD 6+ mos PF IM (Fluarix Quad PF)  3. Preoperative clearance  Z01.818    She is going to have an upcoming knee replacement.  She certainly is able to obtain greater than 4 METS of exercise.  Her labs are all stable, and her kidney function is better compared to others of her same age.  She has a BMI of 31.  The rest of her preoperative requirements for the total joint surgeon will be reviewed for the patient, but potential benefits outweigh potential risks in this case.  Health Maintenance Exam: The patient's preventative maintenance and recommended screening tests for an annual  wellness exam were reviewed in full today. Brought up to date unless services declined.  Counselled on the importance of diet, exercise, and its role in overall health and mortality. The patient's FH and SH was reviewed, including their home life, tobacco status,  and drug and alcohol status.  Follow-up in 1 year for physical exam or additional follow-up below.  Follow-up: No follow-ups on file. Or follow-up in 1 year if not noted.  No future appointments.  No orders of the defined types were placed in this encounter.  There are no discontinued medications. Orders Placed This Encounter  Procedures  . Flu Vaccine QUAD 6+ mos PF IM (Fluarix Quad PF)    Signed,  Raykwon Hobbs T. Armanda Forand, MD   Allergies as of 07/29/2019      Reactions   Latex Swelling      Medication List       Accurate as of July 29, 2019 11:59 PM. If you have any questions, ask your nurse or doctor.        atorvastatin 20 MG tablet Commonly known as: LIPITOR Take 1 tablet (20 mg total) by mouth daily.   clobetasol cream 0.05 % Commonly known as: TEMOVATE Apply 1 application daily as needed topically (for psoriasis).

## 2019-09-16 ENCOUNTER — Telehealth: Payer: Self-pay | Admitting: *Deleted

## 2019-09-16 NOTE — Telephone Encounter (Signed)
Patient left a voicemail stating that she works for the [employer] and needs proof of her getting the flu shot at her visit on 07/29/19.

## 2019-09-16 NOTE — Telephone Encounter (Signed)
Immunization record faxed to her at the [facility] at requested.

## 2019-10-15 ENCOUNTER — Telehealth: Payer: Self-pay | Admitting: Family Medicine

## 2019-10-15 MED ORDER — ATORVASTATIN CALCIUM 20 MG PO TABS: 20.0000 mg | ORAL_TABLET | Freq: Every day | ORAL | 3 refills | Status: DC

## 2019-10-15 NOTE — Telephone Encounter (Signed)
Refills sent as requested to [facility].

## 2019-10-15 NOTE — Telephone Encounter (Signed)
Patient stated she was in the office for her physical on 10/19 Her Atorvastatin was suppose to be sent into her pharmacy that day but they have not received the refills. She tried to contact the pharmacy and they stated they could not fill these until we sent over the refills.    [place]

## 2019-10-25 ENCOUNTER — Ambulatory Visit: Payer: 59 | Attending: Internal Medicine

## 2019-10-25 DIAGNOSIS — Z20822 Contact with and (suspected) exposure to covid-19: Secondary | ICD-10-CM

## 2019-10-26 LAB — NOVEL CORONAVIRUS, NAA: SARS-CoV-2, NAA: NOT DETECTED

## 2019-10-28 ENCOUNTER — Encounter: Payer: Self-pay | Admitting: Internal Medicine

## 2019-10-29 ENCOUNTER — Telehealth: Payer: Self-pay | Admitting: Internal Medicine

## 2019-10-29 NOTE — Telephone Encounter (Signed)
LM on vmail to call back to schedule recall colonoscopy per Dr. Celesta Aver recall assessment

## 2019-11-06 ENCOUNTER — Other Ambulatory Visit: Payer: Self-pay

## 2019-11-06 ENCOUNTER — Telehealth: Payer: Self-pay | Admitting: Family Medicine

## 2019-11-06 ENCOUNTER — Other Ambulatory Visit (HOSPITAL_COMMUNITY): Payer: Self-pay | Admitting: Orthopedic Surgery

## 2019-11-06 ENCOUNTER — Ambulatory Visit (HOSPITAL_COMMUNITY)
Admission: RE | Admit: 2019-11-06 | Discharge: 2019-11-06 | Disposition: A | Discharge status: Home / Self Care | Payer: 59 | Source: Ambulatory Visit | Attending: Cardiology | Admitting: Cardiology

## 2019-11-06 DIAGNOSIS — I82401 Acute embolism and thrombosis of unspecified deep veins of right lower extremity: Secondary | ICD-10-CM

## 2019-11-06 DIAGNOSIS — M7989 Other specified soft tissue disorders: Secondary | ICD-10-CM

## 2019-11-06 DIAGNOSIS — M79661 Pain in right lower leg: Secondary | ICD-10-CM

## 2019-11-06 MED ORDER — ELIQUIS DVT/PE STARTER PACK 5 MG PO TBPK: ORAL_TABLET | ORAL | 0 refills | Status: DC

## 2019-11-06 MED ORDER — RIVAROXABAN (XARELTO) VTE STARTER PACK (15 & 20 MG)
ORAL_TABLET | ORAL | 0 refills | Status: DC
Start: 2019-11-06 — End: 2019-12-05

## 2019-11-06 NOTE — Telephone Encounter (Signed)
I was called by Ronald Lobo, PA, with Dr. Ricki Rodriguez.  She has a DVT in the R LE 8 days s/p TKA.  Will send in Eliquis with 2-3 week f/u with me.   Robin, patient to f/u with me in the office in 3 weeks

## 2019-11-06 NOTE — Telephone Encounter (Signed)
Converted to Xarelto.  Eliquis not available

## 2019-11-06 NOTE — Addendum Note (Signed)
Addended by: Owens Loffler on: 11/06/2019 04:57 PM   Modules accepted: Orders

## 2019-11-07 NOTE — Telephone Encounter (Signed)
Opened in error

## 2019-11-07 NOTE — Telephone Encounter (Signed)
Left message asking pt to call office  °

## 2019-11-08 NOTE — Telephone Encounter (Signed)
Left message asking pt to call office  °

## 2019-11-08 NOTE — Telephone Encounter (Signed)
Spoke to pt. She said she will call back later today to schedule appt with Dr Lorelei Pont.

## 2019-11-11 NOTE — Telephone Encounter (Signed)
Left message asking pt to call office  °

## 2019-11-12 NOTE — Telephone Encounter (Signed)
Appointment 2/25

## 2019-12-05 ENCOUNTER — Encounter: Payer: Self-pay | Admitting: Family Medicine

## 2019-12-05 ENCOUNTER — Ambulatory Visit: Payer: 59 | Admitting: Family Medicine

## 2019-12-05 ENCOUNTER — Other Ambulatory Visit: Payer: Self-pay

## 2019-12-05 VITALS — BP 88/49 | HR 79 | Temp 97.2°F | Ht 68.0 in | Wt 188.2 lb

## 2019-12-05 DIAGNOSIS — Z20822 Contact with and (suspected) exposure to covid-19: Secondary | ICD-10-CM | POA: Diagnosis not present

## 2019-12-05 DIAGNOSIS — I824Z1 Acute embolism and thrombosis of unspecified deep veins of right distal lower extremity: Secondary | ICD-10-CM

## 2019-12-05 MED ORDER — RIVAROXABAN 20 MG PO TABS: 20.0000 mg | ORAL_TABLET | Freq: Every day | ORAL | 0 refills | Status: DC

## 2019-12-05 NOTE — Progress Notes (Signed)
Angela Boody T. Folasade Mooty, MD Primary Care and [place] at [facility] [name], [address] Phone: [phone]  FAX: [place] - 64 y.o. female  MRN [id number]  Date of Birth: 10/19/1955  Visit Date: 12/05/2019  PCP: Angela Loffler, MD  Referred by: Angela Loffler, MD  Chief Complaint  Patient presents with  . Follow-up    DVT    This visit occurred during the SARS-CoV-2 public health emergency.  Safety protocols were in place, including screening questions prior to the visit, additional usage of staff PPE, and extensive cleaning of exam room while observing appropriate contact time as indicated for disinfecting solutions.   Subjective:   Angela Moses is a 64 y.o. very pleasant female patient who presents with the following:  F/u DVT diagosis.   11/05/2018.  S/p R TKA.  PT stopped therapy.  Went to the PA and he did an [place]. R LE.  She had a known unprovoked DVT status post right total knee arthroplasty.  This was initially found on physical therapy exam, and on the same day she saw Dr. Jacquiline Doe PA, and he obtained a Doppler ultrasound and found the right lower extremity DVT.  At that time, I did place her on a Xarelto titer pack.  She is here today to follow-up on this and discuss longer-term management of her lower extremity DVT.  Her son also had a positive COVID-19 test, and her husband had some COVID-19 antibodies that were positive.  ULTRASOUND FOLLOW-UP WITH UNPROVOKED DVT  ? covid AB test?    Review of Systems is noted in the HPI, as appropriate  Objective:   BP (!) 88/49   Pulse 79   Temp (!) 97.2 F (36.2 C) (Temporal)   Ht 5\' 8"  (1.727 m)   Wt 188 lb 4 oz (85.4 kg)   SpO2 96%   BMI 28.62 kg/m   GEN: WDWN, NAD, Non-toxic HEENT: Atraumatic, Normocephalic. Neck supple. No masses. EXTR: No c/c/e -nontender at the lower extremity on the right to exam and with palpation of  the calf  NEURO Normal gait.  PSYCH: Normally interactive. Conversant.   Laboratory and Imaging Data: VAS [place] LOWER EXTREMITY VENOUS (DVT)  Result Date: 11/06/2019  Lower Venous Study Indications: Right medial upper calf pain x 2 days. Patient denies SOB or chest pain.  Risk Factors: Surgery Right TKR 10/29/2019. Anticoagulation: Aspirin. Comparison Study: None Performing Technologist: Angela Moses RVT, RDCS (AE), RDMS  Examination Guidelines: A complete evaluation includes B-mode imaging, spectral Doppler, color Doppler, and power Doppler as needed of all accessible portions of each vessel. Bilateral testing is considered an integral part of a complete examination. Limited examinations for reoccurring indications may be performed as noted.  +---------+---------------+---------+-----------+-----------------+------------+ RIGHT    CompressibilityPhasicitySpontaneityProperties       Thrombus                                                                  Aging        +---------+---------------+---------+-----------+-----------------+------------+ CFV      Full           Yes      Yes                                      +---------+---------------+---------+-----------+-----------------+------------+  SFJ      Full           Yes      Yes                                      +---------+---------------+---------+-----------+-----------------+------------+ FV Prox  Full           Yes      Yes                                      +---------+---------------+---------+-----------+-----------------+------------+ FV Mid   Full           Yes      Yes                                      +---------+---------------+---------+-----------+-----------------+------------+ FV DistalFull           Yes      Yes                                      +---------+---------------+---------+-----------+-----------------+------------+ PFV      Full                                                              +---------+---------------+---------+-----------+-----------------+------------+ POP      Partial        No       Yes        rigid            Acute                                                    w/compression                 +---------+---------------+---------+-----------+-----------------+------------+ PTV      None           No       No         dilated          Acute        +---------+---------------+---------+-----------+-----------------+------------+ PERO     Full           Yes      Yes                                      +---------+---------------+---------+-----------+-----------------+------------+ Gastroc  Full                                                             +---------+---------------+---------+-----------+-----------------+------------+ GSV  Full           Yes      Yes                                      +---------+---------------+---------+-----------+-----------------+------------+   +----+---------------+---------+-----------+----------+--------------+ LEFTCompressibilityPhasicitySpontaneityPropertiesThrombus Aging +----+---------------+---------+-----------+----------+--------------+ CFV Full           Yes      Yes                                 +----+---------------+---------+-----------+----------+--------------+    Findings reported to Angela Halls, PA at 4:15 pm.  Summary:  Right: Findings consistent with acute deep vein thrombosis involving the right popliteal vein, and right posterior tibial veins. No cystic structure found in the popliteal fossa. The distal popiteal vein is non-occlusive DVT and the posterior tibial vein  is occlusive DVT Left: No evidence of common femoral vein obstruction. All other veins visualized appear fully compressible and demonstrate appropriate Doppler characteristics.  *See table(s) above for measurements and observations. Electronically signed by Angela Burow MD  on 11/06/2019 at 5:26:57 PM.    Final     Assessment and Plan:     ICD-10-CM   1. Acute venous embolism and thrombosis of deep vessels of distal end of right lower extremity (HCC)  I82.4Z1   2. Close exposure to COVID-19 virus  Z20.822 SARS CoV2 Serology(COVID19) AB(IgG,IgM),Immunoassay   Unprovoked DVT, she will need to have 3 months of anticoagulation, continue with Xarelto.  Known exposure to COVID-19, will obtain IgG and IgM antibody testing.  Follow-up: No follow-ups on file.  Meds ordered this encounter  Medications  . rivaroxaban (XARELTO) 20 MG TABS tablet    Sig: Take 1 tablet (20 mg total) by mouth daily with supper.    Dispense:  90 tablet    Refill:  0   Medications Discontinued During This Encounter  Medication Reason  . Apixaban Starter Pack (ELIQUIS DVT/PE STARTER PACK) 5 MG TBPK Completed Course  . clobetasol cream (TEMOVATE) 0.05 % Completed Course  . Rivaroxaban 15 & 20 MG TBPK Completed Course   Orders Placed This Encounter  Procedures  . SARS CoV2 Serology(COVID19) AB(IgG,IgM),Immunoassay    Signed,  Britteny Fiebelkorn T. Catrinia Racicot, MD   Outpatient Encounter Medications as of 12/05/2019  Medication Sig  . atorvastatin (LIPITOR) 20 MG tablet Take 1 tablet (20 mg total) by mouth daily.  Marland Kitchen gabapentin (NEURONTIN) 300 MG capsule Take 1 capsule by mouth daily.  . rivaroxaban (XARELTO) 20 MG TABS tablet Take 1 tablet (20 mg total) by mouth daily with supper.  . [DISCONTINUED] Apixaban Starter Pack (ELIQUIS DVT/PE STARTER PACK) 5 MG TBPK Take as directed on package: start with two-5mg  tablets twice daily for 7 days. On day 8, switch to one-5mg  tablet twice daily.  . [DISCONTINUED] clobetasol cream (TEMOVATE) [id number] % Apply 1 application daily as needed topically (for psoriasis).  . [DISCONTINUED] Rivaroxaban 15 & 20 MG TBPK Follow package directions: Take one 15mg  tablet by mouth twice a day. On day 22, switch to one 20mg  tablet once a day. Take with food.   No  facility-administered encounter medications on file as of 12/05/2019.

## 2019-12-06 LAB — SARS COV-2 SEROLOGY(COVID-19)AB(IGG,IGM),IMMUNOASSAY
SARS CoV-2 AB IgG: POSITIVE — AB
SARS CoV-2 IgM: POSITIVE — AB

## 2020-01-22 ENCOUNTER — Other Ambulatory Visit (HOSPITAL_COMMUNITY): Payer: Self-pay | Admitting: Orthopedic Surgery

## 2020-01-22 DIAGNOSIS — M79669 Pain in unspecified lower leg: Secondary | ICD-10-CM

## 2020-01-23 ENCOUNTER — Other Ambulatory Visit: Payer: Self-pay

## 2020-01-23 ENCOUNTER — Ambulatory Visit (HOSPITAL_COMMUNITY)
Admission: RE | Admit: 2020-01-23 | Discharge: 2020-01-23 | Disposition: A | Discharge status: Home / Self Care | Payer: 59 | Source: Ambulatory Visit | Attending: Cardiology | Admitting: Cardiology

## 2020-01-23 DIAGNOSIS — M7989 Other specified soft tissue disorders: Secondary | ICD-10-CM | POA: Diagnosis present

## 2020-01-23 DIAGNOSIS — M79661 Pain in right lower leg: Secondary | ICD-10-CM | POA: Diagnosis not present

## 2020-01-23 DIAGNOSIS — M79669 Pain in unspecified lower leg: Secondary | ICD-10-CM

## 2020-01-30 ENCOUNTER — Other Ambulatory Visit: Payer: Self-pay | Admitting: Family Medicine

## 2020-03-18 ENCOUNTER — Telehealth: Payer: Self-pay | Admitting: *Deleted

## 2020-03-18 NOTE — Telephone Encounter (Signed)
Patient called stating that she was put on Xarelto because of a blood clot after knee surgery. Patient stated that she was told that she should be on Xarelto for 3 months.  Patient stated that she started  Xarelto on November 06, 2019 and is now on her 4th bottle. . Patient stated that she needs confirmation as to when she no longer has to take the Xarelto.

## 2020-03-18 NOTE — Telephone Encounter (Signed)
Left message for Angela Moses that she can stop the Xarelto per Dr. Lorelei Pont.  She just needed to be on it for three full months.  I ask that she call us back with any questions.

## 2020-03-18 NOTE — Telephone Encounter (Signed)
Yes, she should be able to stop it - she needed a full 3 months

## 2020-04-01 NOTE — Telephone Encounter (Signed)
Patient left a voicemail requesting a call back from the message that she left a couple of weeks ago. Patient stated that she is on her 4th month of taking Xarelto and needs to know when to stop it. Patient stated that she is getting ready to travel and wants to know if that will have any effect on her stopping it.

## 2020-04-01 NOTE — Telephone Encounter (Signed)
Spoke with Angela Moses and advised her that I left her a message 2 weeks ago telling her she could stop the Xarelto.  She only needed to be on it for a full 3 months. Patient is concerned because she will be flying to [place] at the end of July and wants to know if she should be concerned.  They will be stopping in [place] to change planes.  I advised her to get up and walk around some to strength her legs a few times while on her flights.  Patient states understanding.

## 2020-04-16 ENCOUNTER — Telehealth: Payer: 59 | Admitting: Family Medicine

## 2020-06-10 NOTE — Progress Notes (Signed)
Cancelled appt

## 2020-06-23 ENCOUNTER — Encounter: Payer: Self-pay | Admitting: Family Medicine

## 2020-08-13 ENCOUNTER — Other Ambulatory Visit: Payer: Self-pay

## 2020-08-13 ENCOUNTER — Other Ambulatory Visit (INDEPENDENT_AMBULATORY_CARE_PROVIDER_SITE_OTHER): Payer: 59

## 2020-08-13 DIAGNOSIS — Z79899 Other long term (current) drug therapy: Secondary | ICD-10-CM | POA: Diagnosis not present

## 2020-08-13 DIAGNOSIS — Z131 Encounter for screening for diabetes mellitus: Secondary | ICD-10-CM | POA: Diagnosis not present

## 2020-08-13 DIAGNOSIS — R5383 Other fatigue: Secondary | ICD-10-CM | POA: Diagnosis not present

## 2020-08-13 DIAGNOSIS — E785 Hyperlipidemia, unspecified: Secondary | ICD-10-CM

## 2020-08-13 LAB — LIPID PANEL
Cholesterol: 188 mg/dL (ref 0–200)
HDL: 59.9 mg/dL (ref >39.00)
LDL Cholesterol: 95 mg/dL (ref 0–99)
NonHDL: 127.69
Total CHOL/HDL Ratio: 3
Triglycerides: 163 mg/dL — ABNORMAL HIGH (ref 0.0–149.0)
VLDL: 32.6 mg/dL (ref 0.0–40.0)

## 2020-08-13 LAB — CBC WITH DIFFERENTIAL/PLATELET
Basophils Absolute: 0 10*3/uL (ref 0.0–0.1)
Basophils Relative: 0.6 % (ref 0.0–3.0)
Eosinophils Absolute: 0.3 10*3/uL (ref 0.0–0.7)
Eosinophils Relative: 4.1 % (ref 0.0–5.0)
HCT: 44.4 % (ref 36.0–46.0)
Hemoglobin: 14.3 g/dL (ref 12.0–15.0)
Lymphocytes Relative: 25.9 % (ref 12.0–46.0)
Lymphs Abs: 1.7 10*3/uL (ref 0.7–4.0)
MCHC: 32.3 g/dL (ref 30.0–36.0)
MCV: 88.7 fl (ref 78.0–100.0)
Monocytes Absolute: 0.6 10*3/uL (ref 0.1–1.0)
Monocytes Relative: 8.7 % (ref 3.0–12.0)
Neutro Abs: 4 10*3/uL (ref 1.4–7.7)
Neutrophils Relative %: 60.7 % (ref 43.0–77.0)
Platelets: 245 10*3/uL (ref 150.0–400.0)
RBC: 5 Mil/uL (ref 3.87–5.11)
RDW: 13.6 % (ref 11.5–15.5)
WBC: 6.5 10*3/uL (ref 4.0–10.5)

## 2020-08-13 LAB — HEPATIC FUNCTION PANEL
ALT: 18 U/L (ref 0–35)
AST: 15 U/L (ref 0–37)
Albumin: 4.1 g/dL (ref 3.5–5.2)
Alkaline Phosphatase: 106 U/L (ref 39–117)
Bilirubin, Direct: 0.1 mg/dL (ref 0.0–0.3)
Total Bilirubin: 0.6 mg/dL (ref 0.2–1.2)
Total Protein: 6.7 g/dL (ref 6.0–8.3)

## 2020-08-13 LAB — BASIC METABOLIC PANEL
BUN: 15 mg/dL (ref 6–23)
CO2: 31 mEq/L (ref 19–32)
Calcium: 9.5 mg/dL (ref 8.4–10.5)
Chloride: 107 mEq/L (ref 96–112)
Creatinine, Ser: 0.96 mg/dL (ref 0.40–1.20)
GFR: 63.62 mL/min (ref >60.00)
Glucose, Bld: 88 mg/dL (ref 70–99)
Potassium: 4.8 mEq/L (ref 3.5–5.1)
Sodium: 143 mEq/L (ref 135–145)

## 2020-08-13 LAB — HEMOGLOBIN A1C: Hgb A1c MFr Bld: 6 % (ref 4.6–6.5)

## 2020-08-13 LAB — TSH: TSH: 2.47 u[IU]/mL (ref 0.35–4.50)

## 2020-08-17 ENCOUNTER — Ambulatory Visit (INDEPENDENT_AMBULATORY_CARE_PROVIDER_SITE_OTHER): Payer: 59 | Admitting: Family Medicine

## 2020-08-17 ENCOUNTER — Encounter: Payer: Self-pay | Admitting: Family Medicine

## 2020-08-17 ENCOUNTER — Other Ambulatory Visit: Payer: Self-pay

## 2020-08-17 VITALS — BP 110/70 | HR 80 | Temp 98.2°F | Ht 68.0 in | Wt 196.0 lb

## 2020-08-17 DIAGNOSIS — Z0000 Encounter for general adult medical examination without abnormal findings: Secondary | ICD-10-CM

## 2020-08-17 DIAGNOSIS — Z23 Encounter for immunization: Secondary | ICD-10-CM

## 2020-08-17 NOTE — Progress Notes (Signed)
Cedarius Kersh T. Avion Patella, MD, [place] at [facility] [place], [address]  Phone: [phone]  FAX: [phone]  Angela Moses - 64 y.o. female  MRN [id number]  Date of Birth: Apr 02, 1956  Date: 08/17/2020  PCP: Owens Loffler, MD  Referral: Owens Loffler, MD  Chief Complaint  Patient presents with  . Annual Exam    This visit occurred during the SARS-CoV-2 public health emergency.  Safety protocols were in place, including screening questions prior to the visit, additional usage of staff PPE, and extensive cleaning of exam room while observing appropriate contact time as indicated for disinfecting solutions.   Patient Care Team: Owens Loffler, MD as PCP - General Subjective:   Angela Moses is a 63 y.o. pleasant patient who presents with the following:  Health Maintenance Summary Reviewed and updated, unless pt declines services.  Tobacco History Reviewed. Non-smoker Alcohol: No concerns, no excessive use Exercise Habits: Some activity, rec at least 30 mins 5 times a week - works on her farm daily (beef cattle) STD concerns: none Drug Use: None Lumps or breast concerns: no S/p hyst  Colon cancer screening OK to get Covid #3 after   Health Maintenance  Topic Date Due  . COLONOSCOPY  09/29/2019  . TETANUS/TDAP  10/01/2020  . MAMMOGRAM  06/23/2022  . INFLUENZA VACCINE  Completed  . COVID-19 Vaccine  Completed  . Hepatitis C Screening  Completed  . HIV Screening  Completed    Immunization History  Administered Date(s) Administered  . Influenza Whole 07/10/2010, 10/01/2010  . Influenza,inj,Quad PF,6+ Mos 08/17/2015, 08/23/2017, 07/29/2019, 08/17/2020  . Influenza-Unspecified 07/10/2018  . PFIZER SARS-COV-2 Vaccination 02/13/2020, 03/05/2020  . Td 03/10/2006, 10/01/2010   Patient Active Problem List   Diagnosis Date Noted  . Arthrofibrosis of total  knee arthroplasty ([place]) 10/30/2017  . Primary osteoarthritis of both knees 03/26/2015  . Psoriasis 03/11/2015  . CERVICALGIA 12/27/2010  . HYPERLIPIDEMIA 06/24/2009    Past Medical History:  Diagnosis Date  . HLD (hyperlipidemia)   . Psoriasis 03/11/2015    Past Surgical History:  Procedure Laterality Date  . ABDOMINAL HYSTERECTOMY  1997  . EXCISION HAGLUND'S DEFORMITY WITH ACHILLES TENDON REPAIR Right 08/08/2013   Procedure: RIGHT EXCISION HAGLUND'S DEFORMITY  WITH ACHILLES DEBRIDEMENT AND RECONSTRUCTION;  Surgeon: Wylene Simmer, MD;  Location: [place];  Service: Orthopedics;  Laterality: Right;  . GASTROC RECESSION EXTREMITY Right 08/08/2013   Procedure: GASTROCNEMIUS RECESSION ;  Surgeon: Wylene Simmer, MD;  Location: [place];  Service: Orthopedics;  Laterality: Right;  . KNEE CLOSED REDUCTION Left 10/30/2017   Procedure: CLOSED MANIPULATION KNEE;  Surgeon: Gaynelle Arabian, MD;  [facility];  Service: Orthopedics;  Laterality: Left;  . TONSILLECTOMY    . TOTAL KNEE ARTHROPLASTY Left 09/11/2017   Procedure: LEFT TOTAL KNEE ARTHROPLASTY;  Surgeon: Gaynelle Arabian, MD;  [facility];  Service: Orthopedics;  Laterality: Left;  . tubes in ears     lt   . TYMPANIC MEMBRANE REPAIR    . WISDOM TOOTH EXTRACTION      Family History  Problem Relation Age of Onset  . Parkinsonism Father   . Macular degeneration Father   . Dementia Mother   . Diabetes Mother   . Breast cancer Maternal Grandmother        cause of death  . Breast cancer Paternal Grandmother        cause of death  .  Heart attack Maternal Grandfather        cause of death  . Heart attack Paternal Grandfather        cause of death    Past Medical History, Surgical History, Social History, Family History, Problem List, Medications, and Allergies have been reviewed and updated if relevant.  Review of Systems: Pertinent positives are listed above.  Otherwise, a full 14 point review of  systems has been done in full and it is negative except where it is noted positive.  Objective:   BP 110/70   Pulse 80   Temp 98.2 F (36.8 C) (Temporal)   Ht 5\' 8"  (1.727 m)   Wt 196 lb (88.9 kg)   SpO2 97%   BMI 29.80 kg/m  Ideal Body Weight: Weight in (lb) to have BMI = 25: 164.1 No exam data present Depression screen [facility] 2/9 08/17/2020 07/29/2019 06/06/2018  Decreased Interest 0 0 0  Down, Depressed, Hopeless 0 0 0  PHQ - 2 Score 0 0 0     GEN: well developed, well nourished, no acute distress Eyes: conjunctiva and lids normal, PERRLA, EOMI ENT: TM clear, nares clear, oral exam WNL Neck: supple, no lymphadenopathy, no thyromegaly, no JVD Pulm: clear to auscultation and percussion, respiratory effort normal CV: regular rate and rhythm, S1-S2, no murmur, rub or gallop, no bruits Chest: no scars, masses, no lumps BREAST: breast exam declined GI: soft, non-tender; no hepatosplenomegaly, masses; active bowel sounds all quadrants GU: GU exam declined Lymph: no cervical, axillary or inguinal adenopathy MSK: gait normal, muscle tone and strength WNL, no joint swelling, effusions, discoloration, crepitus  SKIN: clear, good turgor, color WNL, no rashes, lesions, or ulcerations Neuro: normal mental status, normal strength, sensation, and motion Psych: alert; oriented to person, place and time, normally interactive and not anxious or depressed in appearance.   All labs reviewed with patient. Results for orders placed or performed in visit on 08/13/20  Lipid panel  Result Value Ref Range   Cholesterol 188 0 - 200 mg/dL   Triglycerides 163.0 (H) 0 - 149 mg/dL   HDL 59.90 >39.00 mg/dL   VLDL 32.6 0.0 - 40.0 mg/dL   LDL Cholesterol 95 0 - 99 mg/dL   Total CHOL/HDL Ratio 3    NonHDL 127.69   Hepatic function panel  Result Value Ref Range   Total Bilirubin 0.6 0.2 - 1.2 mg/dL   Bilirubin, Direct 0.1 0.0 - 0.3 mg/dL   Alkaline Phosphatase 106 39 - 117 U/L   AST 15 0 - 37 U/L    ALT 18 0 - 35 U/L   Total Protein 6.7 6.0 - 8.3 g/dL   Albumin 4.1 3.5 - 5.2 g/dL  Basic metabolic panel  Result Value Ref Range   Sodium 143 135 - 145 mEq/L   Potassium 4.8 3.5 - 5.1 mEq/L   Chloride 107 96 - 112 mEq/L   CO2 31 19 - 32 mEq/L   Glucose, Bld 88 70 - 99 mg/dL   BUN 15 6 - 23 mg/dL   Creatinine, Ser 0.96 0.40 - 1.20 mg/dL   GFR 63.62 >60.00 mL/min   Calcium 9.5 8.4 - 10.5 mg/dL  CBC with Differential/Platelet  Result Value Ref Range   WBC 6.5 4.0 - 10.5 K/uL   RBC 5.00 3.87 - 5.11 Mil/uL   Hemoglobin 14.3 12.0 - 15.0 g/dL   HCT 44.4 36 - 46 %   MCV 88.7 78.0 - 100.0 fl   MCHC 32.3 30.0 - 36.0 g/dL  RDW 13.6 11.5 - 15.5 %   Platelets 245.0 150 - 400 K/uL   Neutrophils Relative % 60.7 43 - 77 %   Lymphocytes Relative 25.9 12 - 46 %   Monocytes Relative 8.7 3 - 12 %   Eosinophils Relative 4.1 0 - 5 %   Basophils Relative 0.6 0 - 3 %   Neutro Abs 4.0 1.4 - 7.7 K/uL   Lymphs Abs 1.7 0.7 - 4.0 K/uL   Monocytes Absolute 0.6 0.1 - 1.0 K/uL   Eosinophils Absolute 0.3 0.0 - 0.7 K/uL   Basophils Absolute 0.0 0.0 - 0.1 K/uL  TSH  Result Value Ref Range   TSH 2.47 0.35 - 4.50 uIU/mL  Hemoglobin A1c  Result Value Ref Range   Hgb A1c MFr Bld 6.0 4.6 - 6.5 %   No results found.  Assessment and Plan:     ICD-10-CM   1. Healthcare maintenance  Z00.00   2. Need for influenza vaccination  Z23 Flu Vaccine QUAD 6+ mos PF IM (Fluarix Quad PF)   Globally, she is doing well Cont with activity and eating well  Covid #3 is pending She is going to call GI for recall colon herself  Health Maintenance Exam: The patient's preventative maintenance and recommended screening tests for an annual wellness exam were reviewed in full today. Brought up to date unless services declined.  Counselled on the importance of diet, exercise, and its role in overall health and mortality. The patient's FH and SH was reviewed, including their home life, tobacco status, and drug and alcohol  status.  Follow-up in 1 year for physical exam or additional follow-up below.  Follow-up: No follow-ups on file. Or follow-up in 1 year if not noted.  No future appointments.  No orders of the defined types were placed in this encounter.  Medications Discontinued During This Encounter  Medication Reason  . gabapentin (NEURONTIN) 300 MG capsule Completed Course  . XARELTO 20 MG TABS tablet Completed Course   Orders Placed This Encounter  Procedures  . Flu Vaccine QUAD 6+ mos PF IM (Fluarix Quad PF)    Signed,  Saysha Menta T. Flay Ghosh, MD   Allergies as of 08/17/2020      Reactions   Latex Swelling      Medication List       Accurate as of August 17, 2020 11:59 PM. If you have any questions, ask your nurse or doctor.        STOP taking these medications   gabapentin 300 MG capsule Commonly known as: NEURONTIN Stopped by: Owens Loffler, MD   Xarelto 20 MG Tabs tablet Generic drug: rivaroxaban Stopped by: Owens Loffler, MD     TAKE these medications   atorvastatin 20 MG tablet Commonly known as: LIPITOR Take 1 tablet (20 mg total) by mouth daily.

## 2020-08-18 ENCOUNTER — Encounter: Payer: Self-pay | Admitting: Family Medicine

## 2020-10-15 ENCOUNTER — Other Ambulatory Visit: Payer: Self-pay | Admitting: Family Medicine

## 2021-06-29 LAB — HM MAMMOGRAPHY

## 2021-07-01 ENCOUNTER — Encounter: Payer: Self-pay | Admitting: Family Medicine

## 2021-08-23 ENCOUNTER — Ambulatory Visit (INDEPENDENT_AMBULATORY_CARE_PROVIDER_SITE_OTHER): Payer: 59 | Admitting: Family Medicine

## 2021-08-23 ENCOUNTER — Encounter: Payer: Self-pay | Admitting: Family Medicine

## 2021-08-23 ENCOUNTER — Other Ambulatory Visit: Payer: Self-pay

## 2021-08-23 VITALS — BP 90/60 | HR 60 | Temp 97.8°F | Ht 68.0 in | Wt 202.5 lb

## 2021-08-23 DIAGNOSIS — E782 Mixed hyperlipidemia: Secondary | ICD-10-CM | POA: Diagnosis not present

## 2021-08-23 DIAGNOSIS — R5383 Other fatigue: Secondary | ICD-10-CM | POA: Diagnosis not present

## 2021-08-23 DIAGNOSIS — Z131 Encounter for screening for diabetes mellitus: Secondary | ICD-10-CM | POA: Diagnosis not present

## 2021-08-23 DIAGNOSIS — Z79899 Other long term (current) drug therapy: Secondary | ICD-10-CM

## 2021-08-23 DIAGNOSIS — Z0000 Encounter for general adult medical examination without abnormal findings: Secondary | ICD-10-CM

## 2021-08-23 DIAGNOSIS — Z23 Encounter for immunization: Secondary | ICD-10-CM

## 2021-08-23 LAB — LIPID PANEL
Cholesterol: 169 mg/dL (ref 0–200)
HDL: 57.4 mg/dL (ref >39.00)
LDL Cholesterol: 84 mg/dL (ref 0–99)
NonHDL: 111.62
Total CHOL/HDL Ratio: 3
Triglycerides: 138 mg/dL (ref 0.0–149.0)
VLDL: 27.6 mg/dL (ref 0.0–40.0)

## 2021-08-23 LAB — BASIC METABOLIC PANEL
BUN: 15 mg/dL (ref 6–23)
CO2: 27 mEq/L (ref 19–32)
Calcium: 9.2 mg/dL (ref 8.4–10.5)
Chloride: 106 mEq/L (ref 96–112)
Creatinine, Ser: 1 mg/dL (ref 0.40–1.20)
GFR: 60.14 mL/min (ref >60.00)
Glucose, Bld: 90 mg/dL (ref 70–99)
Potassium: 4.4 mEq/L (ref 3.5–5.1)
Sodium: 140 mEq/L (ref 135–145)

## 2021-08-23 LAB — HEMOGLOBIN A1C: Hgb A1c MFr Bld: 6.5 % (ref 4.6–6.5)

## 2021-08-23 LAB — CBC WITH DIFFERENTIAL/PLATELET
Basophils Absolute: 0 10*3/uL (ref 0.0–0.1)
Basophils Relative: 0.6 % (ref 0.0–3.0)
Eosinophils Absolute: 0.3 10*3/uL (ref 0.0–0.7)
Eosinophils Relative: 4.4 % (ref 0.0–5.0)
HCT: 36.3 % (ref 36.0–46.0)
Hemoglobin: 11.3 g/dL — ABNORMAL LOW (ref 12.0–15.0)
Lymphocytes Relative: 22.9 % (ref 12.0–46.0)
Lymphs Abs: 1.7 10*3/uL (ref 0.7–4.0)
MCHC: 31 g/dL (ref 30.0–36.0)
MCV: 77.5 fl — ABNORMAL LOW (ref 78.0–100.0)
Monocytes Absolute: 0.6 10*3/uL (ref 0.1–1.0)
Monocytes Relative: 8.6 % (ref 3.0–12.0)
Neutro Abs: 4.8 10*3/uL (ref 1.4–7.7)
Neutrophils Relative %: 63.5 % (ref 43.0–77.0)
Platelets: 267 10*3/uL (ref 150.0–400.0)
RBC: 4.68 Mil/uL (ref 3.87–5.11)
RDW: 16.4 % — ABNORMAL HIGH (ref 11.5–15.5)
WBC: 7.5 10*3/uL (ref 4.0–10.5)

## 2021-08-23 LAB — HEPATIC FUNCTION PANEL
ALT: 14 U/L (ref 0–35)
AST: 15 U/L (ref 0–37)
Albumin: 4.1 g/dL (ref 3.5–5.2)
Alkaline Phosphatase: 108 U/L (ref 39–117)
Bilirubin, Direct: 0.1 mg/dL (ref 0.0–0.3)
Total Bilirubin: 0.6 mg/dL (ref 0.2–1.2)
Total Protein: 6.6 g/dL (ref 6.0–8.3)

## 2021-08-23 LAB — TSH: TSH: 1.97 u[IU]/mL (ref 0.35–5.50)

## 2021-08-23 MED ORDER — ATORVASTATIN CALCIUM 20 MG PO TABS
20.0000 mg | ORAL_TABLET | Freq: Every day | ORAL | 3 refills | Status: DC
Start: 2021-08-23 — End: 2022-09-13

## 2021-08-23 NOTE — Progress Notes (Signed)
Angela Jump T. [name], MD, [name] at [facility] [name], [address]  Phone: [phone]  FAX: [place] - 65 y.o. female  MRN [id number]  Date of Birth: 09/03/1956  Date: 08/23/2021  PCP: Owens Loffler, MD  Referral: Owens Loffler, MD  Chief Complaint  Patient presents with   Annual Exam    This visit occurred during the SARS-CoV-2 public health emergency.  Safety protocols were in place, including screening questions prior to the visit, additional usage of staff PPE, and extensive cleaning of exam room while observing appropriate contact time as indicated for disinfecting solutions.   Patient Care Team: Owens Loffler, MD as PCP - General Subjective:   Angela Moses is a 65 y.o. pleasant patient who presents with the following:  Health Maintenance Summary Reviewed and updated, unless pt declines services.  Tobacco History Reviewed. Non-smoker Alcohol: No concerns, no excessive use - day to day, minimal Exercise Habits: Some activity, rec at least 30 mins 5 times a week STD concerns: none Drug Use: None Lumps or breast concerns: no  Shingrix - today Colon - wil make her appointment in December Covid booster - no booster  Tdap Flu    Health Maintenance  Topic Date Due   Pneumococcal Vaccine 45-41 Years old (1 - PCV) Never done   COLONOSCOPY (Pts 45-62yrs Insurance coverage will need to be confirmed)  09/29/2019   COVID-19 Vaccine (3 - Pfizer risk series) 04/02/2020   Zoster Vaccines- Shingrix (2 of 2) 10/18/2021   MAMMOGRAM  06/30/2023   TETANUS/TDAP  08/24/2031   INFLUENZA VACCINE  Completed   Hepatitis C Screening  Completed   HIV Screening  Completed   HPV VACCINES  Aged Out    Immunization History  Administered Date(s) Administered   Influenza Whole 07/10/2010, 10/01/2010   Influenza,inj,Quad PF,6+ Mos 08/17/2015, 08/23/2017, 07/29/2019, 08/17/2020,  08/23/2021   Influenza-Unspecified 07/10/2018   PFIZER(Purple Top)SARS-COV-2 Vaccination 02/13/2020, 03/05/2020   Td 03/10/2006, 10/01/2010   Tdap 08/23/2021   Zoster Recombinat (Shingrix) 08/23/2021   Patient Active Problem List   Diagnosis Date Noted   Arthrofibrosis of total knee arthroplasty ([place]) 10/30/2017   Primary osteoarthritis of both knees 03/26/2015   Psoriasis 03/11/2015   CERVICALGIA 12/27/2010   Mixed hyperlipidemia 06/24/2009    Past Medical History:  Diagnosis Date   HLD (hyperlipidemia)    Psoriasis 03/11/2015    Past Surgical History:  Procedure Laterality Date   ABDOMINAL HYSTERECTOMY  1997   EXCISION HAGLUND'S DEFORMITY WITH ACHILLES TENDON REPAIR Right 08/08/2013   Procedure: RIGHT EXCISION HAGLUND'S DEFORMITY  WITH ACHILLES DEBRIDEMENT AND RECONSTRUCTION;  Surgeon: Wylene Simmer, MD;  Location: [place];  Service: Orthopedics;  Laterality: Right;   GASTROC RECESSION EXTREMITY Right 08/08/2013   Procedure: GASTROCNEMIUS RECESSION ;  Surgeon: Wylene Simmer, MD;  Location: [place];  Service: Orthopedics;  Laterality: Right;   KNEE CLOSED REDUCTION Left 10/30/2017   Procedure: CLOSED MANIPULATION KNEE;  Surgeon: Gaynelle Arabian, MD;  [facility];  Service: Orthopedics;  Laterality: Left;   TONSILLECTOMY     TOTAL KNEE ARTHROPLASTY Left 09/11/2017   Procedure: LEFT TOTAL KNEE ARTHROPLASTY;  Surgeon: Gaynelle Arabian, MD;  [facility];  Service: Orthopedics;  Laterality: Left;   tubes in ears     lt    TYMPANIC MEMBRANE REPAIR     WISDOM TOOTH EXTRACTION      Family History  Problem Relation Age of Onset  Parkinsonism Father    Macular degeneration Father    Dementia Mother    Diabetes Mother    Breast cancer Maternal Grandmother        cause of death   Breast cancer Paternal Grandmother        cause of death   Heart attack Maternal Grandfather        cause of death   Heart attack Paternal Grandfather         cause of death    Past Medical History, Surgical History, Social History, Family History, Problem List, Medications, and Allergies have been reviewed and updated if relevant.  Review of Systems: Pertinent positives are listed above.  Otherwise, a full 14 point review of systems has been done in full and it is negative except where it is noted positive.  Objective:   BP 90/60   Pulse 60   Temp 97.8 F (36.6 C) (Temporal)   Ht 5\' 8"  (1.727 m)   Wt 202 lb 8 oz (91.9 kg)   SpO2 97%   BMI 30.79 kg/m  Ideal Body Weight: Weight in (lb) to have BMI = 25: 164.1 No results found. Depression screen [facility] 2/9 08/23/2021 08/17/2020 07/29/2019 06/06/2018  Decreased Interest 0 0 0 0  Down, Depressed, Hopeless 0 0 0 0  PHQ - 2 Score 0 0 0 0     GEN: well developed, well nourished, no acute distress Eyes: conjunctiva and lids normal, PERRLA, EOMI ENT: TM clear, nares clear, oral exam WNL Neck: supple, no lymphadenopathy, no thyromegaly, no JVD Pulm: clear to auscultation and percussion, respiratory effort normal CV: regular rate and rhythm, S1-S2, no murmur, rub or gallop, no bruits Chest: no scars, masses, no lumps BREAST: breast exam declined GI: soft, non-tender; no hepatosplenomegaly, masses; active bowel sounds all quadrants GU: GU exam declined Lymph: no cervical, axillary or inguinal adenopathy MSK: gait normal, muscle tone and strength WNL, no joint swelling, effusions, discoloration, crepitus  SKIN: clear, good turgor, color WNL, no rashes, lesions, or ulcerations Neuro: normal mental status, normal strength, sensation, and motion Psych: alert; oriented to person, place and time, normally interactive and not anxious or depressed in appearance.   All labs reviewed with patient. Results for orders placed or performed in visit on 07/01/21  HM MAMMOGRAPHY  Result Value Ref Range   HM Mammogram 0-4 Bi-Rad 0-4 Bi-Rad, Self Reported Normal   No results found.  Assessment and Plan:      ICD-10-CM   1. Healthcare maintenance  Z00.00 Flu Vaccine QUAD 6+ mos PF IM (Fluarix Quad PF)    Tdap vaccine greater than or equal to 7yo IM    Varicella-zoster vaccine IM (Shingrix)    2. Mixed hyperlipidemia  E78.2 Lipid panel    3. Screening for diabetes mellitus  Z13.1 Hemoglobin A1c    4. Encounter for long-term (current) use of medications  E26.834 Basic metabolic panel    CBC with Differential/Platelet    Hepatic function panel    5. Other fatigue  R53.83 TSH    6. Need for influenza vaccination  Z23 Flu Vaccine QUAD 6+ mos PF IM (Fluarix Quad PF)    7. Need for Tdap vaccination  Z23 Tdap vaccine greater than or equal to 7yo IM    8. Need for shingles vaccine  Z23 Varicella-zoster vaccine IM (Shingrix)     Update vaccines. She is going to schedule her own colonoscopy.  Check all labs today.  She is globally doing fairly well at age  65, and I have no other additional recommendations and will await labs.  Continue to be fit and active as able and eat well.  Health Maintenance Exam: The patient's preventative maintenance and recommended screening tests for an annual wellness exam were reviewed in full today. Brought up to date unless services declined.  Counselled on the importance of diet, exercise, and its role in overall health and mortality. The patient's FH and SH was reviewed, including their home life, tobacco status, and drug and alcohol status.  Follow-up in 1 year for physical exam or additional follow-up below.  Follow-up: No follow-ups on file. Or follow-up in 1 year if not noted.  No future appointments.  Meds ordered this encounter  Medications   atorvastatin (LIPITOR) 20 MG tablet    Sig: Take 1 tablet (20 mg total) by mouth daily.    Dispense:  90 tablet    Refill:  3   Medications Discontinued During This Encounter  Medication Reason   atorvastatin (LIPITOR) 20 MG tablet Reorder   Orders Placed This Encounter  Procedures   Flu Vaccine  QUAD 6+ mos PF IM (Fluarix Quad PF)   Tdap vaccine greater than or equal to 7yo IM   Varicella-zoster vaccine IM (Shingrix)   Basic metabolic panel   CBC with Differential/Platelet   Hepatic function panel   Lipid panel   TSH   Hemoglobin A1c    Signed,  Shaneya Taketa T. Jeyda Siebel, MD   Allergies as of 08/23/2021       Reactions   Latex Swelling        Medication List        Accurate as of August 23, 2021 11:37 AM. If you have any questions, ask your nurse or doctor.          atorvastatin 20 MG tablet Commonly known as: LIPITOR Take 1 tablet (20 mg total) by mouth daily.   multivitamin with minerals tablet Take 1 tablet by mouth daily as needed. Takes 3 weeks prior to donating blood   triamcinolone cream 0.1 % Commonly known as: KENALOG SMARTSIG:1 Application Topical 2-3 Times Daily

## 2021-08-25 ENCOUNTER — Encounter: Payer: Self-pay | Admitting: *Deleted

## 2021-09-16 ENCOUNTER — Encounter: Payer: Self-pay | Admitting: Internal Medicine

## 2021-11-10 ENCOUNTER — Ambulatory Visit: Payer: 59

## 2021-11-11 ENCOUNTER — Ambulatory Visit (AMBULATORY_SURGERY_CENTER): Payer: 59 | Admitting: *Deleted

## 2021-11-11 ENCOUNTER — Other Ambulatory Visit: Payer: Self-pay

## 2021-11-11 VITALS — Ht 68.0 in | Wt 195.0 lb

## 2021-11-11 DIAGNOSIS — Z1211 Encounter for screening for malignant neoplasm of colon: Secondary | ICD-10-CM

## 2021-11-11 MED ORDER — NA SULFATE-K SULFATE-MG SULF 17.5-3.13-1.6 GM/177ML PO SOLN: 1.0000 | Freq: Once | ORAL | 0 refills | Status: AC

## 2021-11-11 NOTE — Progress Notes (Signed)

## 2021-11-24 ENCOUNTER — Other Ambulatory Visit: Payer: Self-pay

## 2021-11-24 ENCOUNTER — Ambulatory Visit (INDEPENDENT_AMBULATORY_CARE_PROVIDER_SITE_OTHER): Payer: 59

## 2021-11-24 DIAGNOSIS — Z23 Encounter for immunization: Secondary | ICD-10-CM | POA: Diagnosis not present

## 2021-11-24 NOTE — Progress Notes (Signed)
Patient presented for 2nd shingles vaccine given by Tiare Rohlman, CMA to right deltoid, patient voiced no concerns nor showed any signs of distress during injection.  

## 2021-11-25 ENCOUNTER — Encounter: Payer: 59 | Admitting: Internal Medicine

## 2021-11-29 ENCOUNTER — Encounter: Payer: Self-pay | Admitting: Internal Medicine

## 2021-12-02 ENCOUNTER — Ambulatory Visit (AMBULATORY_SURGERY_CENTER): Payer: 59 | Admitting: Internal Medicine

## 2021-12-02 ENCOUNTER — Other Ambulatory Visit: Payer: Self-pay

## 2021-12-02 ENCOUNTER — Encounter: Payer: Self-pay | Admitting: Internal Medicine

## 2021-12-02 VITALS — BP 108/63 | HR 56 | Temp 97.3°F | Resp 12 | Ht 69.0 in | Wt 195.0 lb

## 2021-12-02 DIAGNOSIS — Z1211 Encounter for screening for malignant neoplasm of colon: Secondary | ICD-10-CM | POA: Diagnosis present

## 2021-12-02 MED ORDER — SODIUM CHLORIDE 0.9 % IV SOLN: 500.0000 mL | INTRAVENOUS | Status: DC

## 2021-12-02 NOTE — Progress Notes (Signed)
[facility] History and Physical   Primary Care Physician:  Owens Loffler, MD   Reason for Procedure:   CRCA screen  Plan:    colonoscopy     HPI: Angela Moses is a 66 y.o. female here for above   Past Medical History:  Diagnosis Date   Arthritis    BILATERAL KNEES-OSTEO   Cancer ([place])    PRE-CANCEROUS SKIN   Clotting disorder ([place])    BLOOD CLOT RIGHT CALF AFTER KNEE REPLACEMENT   HLD (hyperlipidemia)    Psoriasis 03/11/2015    Past Surgical History:  Procedure Laterality Date   ABDOMINAL HYSTERECTOMY  10/11/1995   EXCISION HAGLUND'S DEFORMITY WITH ACHILLES TENDON REPAIR Right 08/08/2013   Procedure: RIGHT EXCISION HAGLUND'S DEFORMITY  WITH ACHILLES DEBRIDEMENT AND RECONSTRUCTION;  Surgeon: Wylene Simmer, MD;  Location: [place];  Service: Orthopedics;  Laterality: Right;   GASTROC RECESSION EXTREMITY Right 08/08/2013   Procedure: GASTROCNEMIUS RECESSION ;  Surgeon: Wylene Simmer, MD;  Location: [place];  Service: Orthopedics;  Laterality: Right;   KNEE CLOSED REDUCTION Left 10/30/2017   Procedure: CLOSED MANIPULATION KNEE;  Surgeon: Gaynelle Arabian, MD;  [facility];  Service: Orthopedics;  Laterality: Left;   REPLACEMENT TOTAL KNEE Right    PJS,[id number]   TONSILLECTOMY     TOTAL KNEE ARTHROPLASTY Left 09/11/2017   Procedure: LEFT TOTAL KNEE ARTHROPLASTY;  Surgeon: Gaynelle Arabian, MD;  [facility];  Service: Orthopedics;  Laterality: Left;   tubes in ears     lt    TYMPANIC MEMBRANE REPAIR     WISDOM TOOTH EXTRACTION      Prior to Admission medications   Medication Sig Start Date End Date Taking? Authorizing Provider  atorvastatin (LIPITOR) 20 MG tablet Take 1 tablet (20 mg total) by mouth daily. 08/23/21  Yes Copland, Frederico Hamman, MD  Multiple Vitamins-Minerals (MULTIVITAMIN WITH MINERALS) tablet Take 1 tablet by mouth as needed. Takes 3 TIMES A WEEK    [provider]  triamcinolone cream (KENALOG)  0.1 % as needed. 03/29/21   [provider]    Current Outpatient Medications  Medication Sig Dispense Refill   atorvastatin (LIPITOR) 20 MG tablet Take 1 tablet (20 mg total) by mouth daily. 90 tablet 3   Multiple Vitamins-Minerals (MULTIVITAMIN WITH MINERALS) tablet Take 1 tablet by mouth as needed. Takes 3 TIMES A WEEK     triamcinolone cream (KENALOG) 0.1 % as needed.     Current Facility-Administered Medications  Medication Dose Route Frequency Provider Last Rate Last Admin   0.9 %  sodium chloride infusion  500 mL Intravenous Continuous Gatha Mayer, MD        Allergies as of 12/02/2021 - Review Complete 12/02/2021  Allergen Reaction Noted   Latex Swelling 08/28/2017    Family History  Problem Relation Age of Onset   Dementia Mother    Diabetes Mother    Parkinsonism Father    Macular degeneration Father    Breast cancer Maternal Grandmother        cause of death   Heart attack Maternal Grandfather        cause of death   Breast cancer Paternal Grandmother        cause of death   Heart attack Paternal Grandfather        cause of death   Colon cancer Neg Hx    Colon polyps Neg Hx    Esophageal cancer Neg Hx    Rectal cancer Neg Hx  Stomach cancer Neg Hx     Social History   Socioeconomic History   Marital status: Married    Spouse name: Not on file   Number of children: Not on file   Years of education: Not on file   Highest education level: Not on file  Occupational History   Not on file  Tobacco Use   Smoking status: Never   Smokeless tobacco: Never  Vaping Use   Vaping Use: Never used  Substance and Sexual Activity   Alcohol use: Yes    Comment: occasional   Drug use: No   Sexual activity: Yes  Other Topics Concern   Not on file  Social History Narrative   Not on file   Social Determinants of Health   Financial Resource Strain: Not on file  Food Insecurity: Not on file  Transportation Needs: Not on file  Physical Activity: Not  on file  Stress: Not on file  Social Connections: Not on file  Intimate Partner Violence: Not on file    Review of Systems:  All other review of systems negative except as mentioned in the HPI.  Physical Exam: Vital signs BP 125/69    Pulse 64    Temp (!) 97.3 F (36.3 C) (Temporal)    Resp 14    Ht 5\' 9"  (1.753 m)    Wt 195 lb (88.5 kg)    SpO2 100%    BMI 28.80 kg/m   General:   Alert,  Well-developed, well-nourished, pleasant and cooperative in NAD Lungs:  Clear throughout to auscultation.   Heart:  Regular rate and rhythm; no murmurs, clicks, rubs,  or gallops. Abdomen:  Soft, nontender and nondistended. Normal bowel sounds.   Neuro/Psych:  Alert and cooperative. Normal mood and affect. A and O x 3   @Acea Yagi  Simonne Maffucci, MD, [facility] [phone] (pager) 12/02/2021 8:59 AM@

## 2021-12-02 NOTE — Op Note (Signed)
[place] Patient Name: Angela Moses Procedure Date: 12/02/2021 8:47 AM MRN: [id number] Endoscopist: Gatha Mayer , MD Age: 66 Referring MD:  Date of Birth: [birth year] Gender: Female Account #: [id number] Procedure:                Colonoscopy Indications:              Screening for colorectal malignant neoplasm, Last                            colonoscopy: 2010 Medicines:                Propofol per Anesthesia, Monitored Anesthesia Care Procedure:                Pre-Anesthesia Assessment:                           - Prior to the procedure, a History and Physical                            was performed, and patient medications and                            allergies were reviewed. The patient's tolerance of                            previous anesthesia was also reviewed. The risks                            and benefits of the procedure and the sedation                            options and risks were discussed with the patient.                            All questions were answered, and informed consent                            was obtained. Prior Anticoagulants: The patient has                            taken no previous anticoagulant or antiplatelet                            agents. ASA Grade Assessment: II - A patient with                            mild systemic disease. After reviewing the risks                            and benefits, the patient was deemed in                            satisfactory condition to undergo the procedure.  After obtaining informed consent, the colonoscope                            was passed under direct vision. Throughout the                            procedure, the patient's blood pressure, pulse, and                            oxygen saturations were monitored continuously. The                            Olympus CF-HQ190L (Serial# [id number]) Colonoscope was                            introduced  through the anus and advanced to the the                            cecum, identified by appendiceal orifice and                            ileocecal valve. The ileocecal valve, appendiceal                            orifice, and rectum were photographed. The quality                            of the bowel preparation was excellent. The bowel                            preparation used was SUPREP via split dose                            instruction. Scope In: 9:03:20 AM Scope Out: 9:14:09 AM Scope Withdrawal Time: 0 hours 8 minutes 7 seconds  Total Procedure Duration: 0 hours 10 minutes 49 seconds  Findings:                 The perianal and digital rectal examinations were                            normal.                           Multiple small and large-mouthed diverticula were                            found in the sigmoid colon, descending colon and                            transverse colon.                           The exam was otherwise without abnormality on  direct and retroflexion views. Complications:            No immediate complications. Estimated blood loss:                            None. Estimated Blood Loss:     Estimated blood loss: none. Impression:               - Diverticulosis in the sigmoid colon, in the                            descending colon and in the transverse colon.                           - The examination was otherwise normal on direct                            and retroflexion views.                           - No specimens collected. Recommendation:           - Repeat colonoscopy in 10 years for screening                            purposes.                           - Resume previous diet.                           - Continue present medications. Gatha Mayer, MD 12/02/2021 9:23:04 AM This report has been signed electronically.

## 2021-12-02 NOTE — Patient Instructions (Signed)
Handout on diverticulosis given to you today     YOU HAD AN ENDOSCOPIC PROCEDURE TODAY AT [facility]:   Refer to the procedure report that was given to you for any specific questions about what was found during the examination.  If the procedure report does not answer your questions, please call your gastroenterologist to clarify.  If you requested that your care partner not be given the details of your procedure findings, then the procedure report has been included in a sealed envelope for you to review at your convenience later.  YOU SHOULD EXPECT: Some feelings of bloating in the abdomen. Passage of more gas than usual.  Walking can help get rid of the air that was put into your GI tract during the procedure and reduce the bloating. If you had a lower endoscopy (such as a colonoscopy or flexible sigmoidoscopy) you may notice spotting of blood in your stool or on the toilet paper. If you underwent a bowel prep for your procedure, you may not have a normal bowel movement for a few days.  Please Note:  You might notice some irritation and congestion in your nose or some drainage.  This is from the oxygen used during your procedure.  There is no need for concern and it should clear up in a day or so.  SYMPTOMS TO REPORT IMMEDIATELY:  Following lower endoscopy (colonoscopy or flexible sigmoidoscopy):  Excessive amounts of blood in the stool  Significant tenderness or worsening of abdominal pains  Swelling of the abdomen that is new, acute  Fever of 100F or higher  For urgent or emergent issues, a gastroenterologist can be reached at any hour by calling [phone]. Do not use MyChart messaging for urgent concerns.    DIET:  We do recommend a small meal at first, but then you may proceed to your regular diet.  Drink plenty of fluids but you should avoid alcoholic beverages for 24 hours.  ACTIVITY:  You should plan to take it easy for the rest of today and you should NOT  DRIVE or use heavy machinery until tomorrow (because of the sedation medicines used during the test).    FOLLOW UP: Our staff will call the number listed on your records 48-72 hours following your procedure to check on you and address any questions or concerns that you may have regarding the information given to you following your procedure. If we do not reach you, we will leave a message.  We will attempt to reach you two times.  During this call, we will ask if you have developed any symptoms of COVID 19. If you develop any symptoms (ie: fever, flu-like symptoms, shortness of breath, cough etc.) before then, please call [phone].  If you test positive for Covid 19 in the 2 weeks post procedure, please call and report this information to [place].    SIGNATURES/CONFIDENTIALITY: You and/or your care partner have signed paperwork which will be entered into your electronic medical record.  These signatures attest to the fact that that the information above on your After Visit Summary has been reviewed and is understood.  Full responsibility of the confidentiality of this discharge information lies with you and/or your care-partner.

## 2021-12-02 NOTE — Progress Notes (Signed)
To Pacu, VSS. Report to [email or web address] 

## 2021-12-06 ENCOUNTER — Telehealth: Payer: Self-pay | Admitting: *Deleted

## 2021-12-06 NOTE — Telephone Encounter (Signed)
No answer for post procedure followup call. Left VM. ?

## 2021-12-06 NOTE — Telephone Encounter (Signed)
Attempted 2nd f/u phone call. No answer. Left message.  °

## 2022-07-05 LAB — HM MAMMOGRAPHY

## 2022-07-06 ENCOUNTER — Encounter: Payer: Self-pay | Admitting: Family Medicine

## 2022-08-09 ENCOUNTER — Other Ambulatory Visit: Payer: Self-pay | Admitting: Family Medicine

## 2022-08-09 DIAGNOSIS — Z79899 Other long term (current) drug therapy: Secondary | ICD-10-CM

## 2022-08-09 DIAGNOSIS — E782 Mixed hyperlipidemia: Secondary | ICD-10-CM

## 2022-08-09 DIAGNOSIS — Z131 Encounter for screening for diabetes mellitus: Secondary | ICD-10-CM

## 2022-08-18 ENCOUNTER — Other Ambulatory Visit (INDEPENDENT_AMBULATORY_CARE_PROVIDER_SITE_OTHER): Payer: 59

## 2022-08-18 DIAGNOSIS — Z131 Encounter for screening for diabetes mellitus: Secondary | ICD-10-CM | POA: Diagnosis not present

## 2022-08-18 DIAGNOSIS — Z79899 Other long term (current) drug therapy: Secondary | ICD-10-CM

## 2022-08-18 DIAGNOSIS — E782 Mixed hyperlipidemia: Secondary | ICD-10-CM | POA: Diagnosis not present

## 2022-08-18 LAB — HEPATIC FUNCTION PANEL
ALT: 18 U/L (ref 0–35)
AST: 17 U/L (ref 0–37)
Albumin: 4 g/dL (ref 3.5–5.2)
Alkaline Phosphatase: 95 U/L (ref 39–117)
Bilirubin, Direct: 0.1 mg/dL (ref 0.0–0.3)
Total Bilirubin: 0.6 mg/dL (ref 0.2–1.2)
Total Protein: 6.4 g/dL (ref 6.0–8.3)

## 2022-08-18 LAB — LIPID PANEL
Cholesterol: 171 mg/dL (ref 0–200)
HDL: 58.2 mg/dL (ref >39.00)
LDL Cholesterol: 82 mg/dL (ref 0–99)
NonHDL: 113.29
Total CHOL/HDL Ratio: 3
Triglycerides: 157 mg/dL — ABNORMAL HIGH (ref 0.0–149.0)
VLDL: 31.4 mg/dL (ref 0.0–40.0)

## 2022-08-18 LAB — TSH: TSH: 2.47 u[IU]/mL (ref 0.35–5.50)

## 2022-08-18 LAB — CBC WITH DIFFERENTIAL/PLATELET
Basophils Absolute: 0 10*3/uL (ref 0.0–0.1)
Basophils Relative: 0.5 % (ref 0.0–3.0)
Eosinophils Absolute: 0.3 10*3/uL (ref 0.0–0.7)
Eosinophils Relative: 5.2 % — ABNORMAL HIGH (ref 0.0–5.0)
HCT: 43.9 % (ref 36.0–46.0)
Hemoglobin: 14.3 g/dL (ref 12.0–15.0)
Lymphocytes Relative: 26.5 % (ref 12.0–46.0)
Lymphs Abs: 1.7 10*3/uL (ref 0.7–4.0)
MCHC: 32.6 g/dL (ref 30.0–36.0)
MCV: 88.8 fl (ref 78.0–100.0)
Monocytes Absolute: 0.6 10*3/uL (ref 0.1–1.0)
Monocytes Relative: 8.5 % (ref 3.0–12.0)
Neutro Abs: 3.9 10*3/uL (ref 1.4–7.7)
Neutrophils Relative %: 59.3 % (ref 43.0–77.0)
Platelets: 209 10*3/uL (ref 150.0–400.0)
RBC: 4.94 Mil/uL (ref 3.87–5.11)
RDW: 15.2 % (ref 11.5–15.5)
WBC: 6.6 10*3/uL (ref 4.0–10.5)

## 2022-08-18 LAB — BASIC METABOLIC PANEL
BUN: 13 mg/dL (ref 6–23)
CO2: 31 mEq/L (ref 19–32)
Calcium: 9.1 mg/dL (ref 8.4–10.5)
Chloride: 108 mEq/L (ref 96–112)
Creatinine, Ser: 1.03 mg/dL (ref 0.40–1.20)
GFR: 57.64 mL/min — ABNORMAL LOW (ref >60.00)
Glucose, Bld: 98 mg/dL (ref 70–99)
Potassium: 4.7 mEq/L (ref 3.5–5.1)
Sodium: 144 mEq/L (ref 135–145)

## 2022-08-18 LAB — HEMOGLOBIN A1C: Hgb A1c MFr Bld: 6.2 % (ref 4.6–6.5)

## 2022-08-24 NOTE — Progress Notes (Unsigned)
Trinitey Roache T. Nanami Whitelaw, MD, [place] at [facility] [place], [address]  Phone: [phone]  FAX: [place] - 66 y.o. female  MRN [id number]  Date of Birth: 1955-11-03  Date: 08/25/2022  PCP: Owens Loffler, MD  Referral: Owens Loffler, MD  No chief complaint on file.  Patient Care Team: Owens Loffler, MD as PCP - General Subjective:   Angela Moses is a 66 y.o. pleasant patient who presents with the following:  Health Maintenance Summary Reviewed and updated, unless pt declines services.  Tobacco History Reviewed. Non-smoker Alcohol: No concerns, no excessive use Exercise Habits: Some activity, rec at least 30 mins 5 times a week STD concerns: none Drug Use: None Lumps or breast concerns: no  She is a very pleasant, healthy young lady and she presents for routine health maintenance.  She does have high cholesterol, this has been stable on the statin.  COVID booster Flu vaccine?  Health Maintenance  Topic Date Due   COVID-19 Vaccine (3 - Pfizer risk series) 04/02/2020   INFLUENZA VACCINE  05/10/2022   MAMMOGRAM  07/05/2024   TETANUS/TDAP  08/24/2031   COLONOSCOPY (Pts 45-50yr Insurance coverage will need to be confirmed)  12/03/2031   Hepatitis C Screening  Completed   HIV Screening  Completed   Zoster Vaccines- Shingrix  Completed   HPV VACCINES  Aged Out    Immunization History  Administered Date(s) Administered   Influenza Whole 07/10/2010, 10/01/2010   Influenza,inj,Quad PF,6+ Mos 08/17/2015, 08/23/2017, 07/29/2019, 08/17/2020, 08/23/2021   Influenza-Unspecified 07/10/2018   PFIZER(Purple Top)SARS-COV-2 Vaccination 02/13/2020, 03/05/2020   Td 03/10/2006, 10/01/2010   Tdap 08/23/2021   Zoster Recombinat (Shingrix) 08/23/2021, 11/24/2021   Patient Active Problem List   Diagnosis Date Noted   Mixed hyperlipidemia 06/24/2009    Priority: Medium     Arthrofibrosis of [place] [place] 10/30/2017   Primary osteoarthritis of both knees 03/26/2015   Psoriasis 03/11/2015   CERVICALGIA 12/27/2010    Past Medical History:  Diagnosis Date   Arthritis    BILATERAL KNEES-OSTEO   Cancer ([facility]    PRE-CANCEROUS SKIN   Clotting disorder ([facility]    BLOOD CLOT RIGHT CALF AFTER KNEE REPLACEMENT   HLD (hyperlipidemia)    Psoriasis 03/11/2015    Past Surgical History:  Procedure Laterality Date   ABDOMINAL HYSTERECTOMY  10/11/1995   EXCISION HAGLUND'S DEFORMITY WITH ACHILLES TENDON REPAIR Right 08/08/2013   Procedure: RIGHT EXCISION HAGLUND'S DEFORMITY  WITH ACHILLES DEBRIDEMENT AND RECONSTRUCTION;  Surgeon: JWylene Simmer MD;  [facility]  Service: Orthopedics;  Laterality: Right;   GASTROC RECESSION EXTREMITY Right 08/08/2013   Procedure: GASTROCNEMIUS RECESSION ;  Surgeon: JWylene Simmer MD;  [facility]  Service: Orthopedics;  Laterality: Right;   KNEE CLOSED REDUCTION Left 10/30/2017   Procedure: CLOSED MANIPULATION KNEE;  Surgeon: AGaynelle Arabian MD;  Location: [facility];  Service: Orthopedics;  Laterality: Left;   REPLACEMENT TOTAL KNEE Right    [id number]  TONSILLECTOMY     TOTAL KNEE ARTHROPLASTY Left 09/11/2017   Procedure: LEFT TOTAL KNEE ARTHROPLASTY;  Surgeon: AGaynelle Arabian MD;  Location: [facility];  Service: Orthopedics;  Laterality: Left;   tubes in ears     lt    TYMPANIC MEMBRANE REPAIR     WISDOM TOOTH EXTRACTION      Family History  Problem Relation Age of Onset   Dementia Mother    Diabetes Mother  Parkinsonism Father    Macular degeneration Father    Breast cancer Maternal Grandmother        cause of death   Heart attack Maternal Grandfather        cause of death   Breast cancer Paternal Grandmother        cause of death   Heart attack Paternal Grandfather        cause of death   Colon cancer Neg Hx    Colon polyps Neg Hx    Esophageal cancer Neg Hx     Rectal cancer Neg Hx    Stomach cancer Neg Hx     Social History   Social History Narrative   Not on file    Past Medical History, Surgical History, Social History, Family History, Problem List, Medications, and Allergies have been reviewed and updated if relevant.  Review of Systems: Pertinent positives are listed above.  Otherwise, a full 14 point review of systems has been done in full and it is negative except where it is noted positive.  Objective:   There were no vitals taken for this visit. Ideal Body Weight:   No results found.    08/23/2021   10:12 AM 08/17/2020    2:32 PM 07/29/2019    2:51 PM 06/06/2018   10:30 AM  Depression screen PHQ 2/9  Decreased Interest 0 0 0 0  Down, Depressed, Hopeless 0 0 0 0  PHQ - 2 Score 0 0 0 0     GEN: well developed, well nourished, no acute distress Eyes: conjunctiva and lids normal, PERRLA, EOMI ENT: TM clear, nares clear, oral exam WNL Neck: supple, no lymphadenopathy, no thyromegaly, no JVD Pulm: clear to auscultation and percussion, respiratory effort normal CV: regular rate and rhythm, S1-S2, no murmur, rub or gallop, no bruits Chest: no scars, masses, no lumps BREAST: breast exam declined GI: soft, non-tender; no hepatosplenomegaly, masses; active bowel sounds all quadrants GU: GU exam declined Lymph: no cervical, axillary or inguinal adenopathy MSK: gait normal, muscle tone and strength WNL, no joint swelling, effusions, discoloration, crepitus  SKIN: clear, good turgor, color WNL, no rashes, lesions, or ulcerations Neuro: normal mental status, normal strength, sensation, and motion Psych: alert; oriented to person, place and time, normally interactive and not anxious or depressed in appearance.   All labs reviewed with patient. Results for orders placed or performed in visit on 08/18/22  TSH  Result Value Ref Range   TSH 2.47 0.35 - 5.50 uIU/mL  Hemoglobin A1c  Result Value Ref Range   Hgb A1c MFr Bld 6.2 4.6 -  6.5 %  CBC with Differential/Platelet  Result Value Ref Range   WBC 6.6 4.0 - 10.5 K/uL   RBC 4.94 3.87 - 5.11 Mil/uL   Hemoglobin 14.3 12.0 - 15.0 g/dL   HCT 43.9 36.0 - 46.0 %   MCV 88.8 78.0 - 100.0 fl   MCHC 32.6 30.0 - 36.0 g/dL   RDW 15.2 11.5 - 15.5 %   Platelets 209.0 150.0 - 400.0 K/uL   Neutrophils Relative % 59.3 43.0 - 77.0 %   Lymphocytes Relative 26.5 12.0 - 46.0 %   Monocytes Relative 8.5 3.0 - 12.0 %   Eosinophils Relative 5.2 (H) 0.0 - 5.0 %   Basophils Relative 0.5 0.0 - 3.0 %   Neutro Abs 3.9 1.4 - 7.7 K/uL   Lymphs Abs 1.7 0.7 - 4.0 K/uL   Monocytes Absolute 0.6 0.1 - 1.0 K/uL   Eosinophils Absolute 0.3  0.0 - 0.7 K/uL   Basophils Absolute 0.0 0.0 - 0.1 K/uL  Basic metabolic panel  Result Value Ref Range   Sodium 144 135 - 145 mEq/L   Potassium 4.7 3.5 - 5.1 mEq/L   Chloride 108 96 - 112 mEq/L   CO2 31 19 - 32 mEq/L   Glucose, Bld 98 70 - 99 mg/dL   BUN 13 6 - 23 mg/dL   Creatinine, Ser 1.03 0.40 - 1.20 mg/dL   GFR 57.64 (L) >60.00 mL/min   Calcium 9.1 8.4 - 10.5 mg/dL  Hepatic function panel  Result Value Ref Range   Total Bilirubin 0.6 0.2 - 1.2 mg/dL   Bilirubin, Direct 0.1 0.0 - 0.3 mg/dL   Alkaline Phosphatase 95 39 - 117 U/L   AST 17 0 - 37 U/L   ALT 18 0 - 35 U/L   Total Protein 6.4 6.0 - 8.3 g/dL   Albumin 4.0 3.5 - 5.2 g/dL  Lipid panel  Result Value Ref Range   Cholesterol 171 0 - 200 mg/dL   Triglycerides 157.0 (H) 0.0 - 149.0 mg/dL   HDL 58.20 >39.00 mg/dL   VLDL 31.4 0.0 - 40.0 mg/dL   LDL Cholesterol 82 0 - 99 mg/dL   Total CHOL/HDL Ratio 3    NonHDL 113.29    No results found.  Assessment and Plan:   No diagnosis found.  Health Maintenance Exam: The patient's preventative maintenance and recommended screening tests for an annual wellness exam were reviewed in full today. Brought up to date unless services declined.  Counselled on the importance of diet, exercise, and its role in overall health and mortality. The patient's  FH and SH was reviewed, including their home life, tobacco status, and drug and alcohol status.  Follow-up in 1 year for physical exam or additional follow-up below.  Disposition: No follow-ups on file.  Future Appointments  Date Time Provider [place]  08/25/2022  9:40 AM Tytus Strahle, Frederico Hamman, MD [facility]    No orders of the defined types were placed in this encounter.  There are no discontinued medications. No orders of the defined types were placed in this encounter.   Signed,  Maud Deed. Rhena Glace, MD   Allergies as of 08/25/2022       Reactions   Latex Swelling        Medication List        Accurate as of August 24, 2022  8:15 AM. If you have any questions, ask your nurse or doctor.          atorvastatin 20 MG tablet Commonly known as: LIPITOR Take 1 tablet (20 mg total) by mouth daily.   multivitamin with minerals tablet Take 1 tablet by mouth as needed. Takes 3 TIMES A WEEK   triamcinolone cream 0.1 % Commonly known as: KENALOG as needed.

## 2022-08-25 ENCOUNTER — Ambulatory Visit (INDEPENDENT_AMBULATORY_CARE_PROVIDER_SITE_OTHER): Payer: 59 | Admitting: Family Medicine

## 2022-08-25 ENCOUNTER — Encounter: Payer: Self-pay | Admitting: Family Medicine

## 2022-08-25 VITALS — BP 92/60 | HR 63 | Temp 97.9°F | Ht 68.0 in | Wt 201.2 lb

## 2022-08-25 DIAGNOSIS — Z23 Encounter for immunization: Secondary | ICD-10-CM | POA: Diagnosis not present

## 2022-08-25 DIAGNOSIS — Z0000 Encounter for general adult medical examination without abnormal findings: Secondary | ICD-10-CM | POA: Diagnosis not present

## 2022-09-13 ENCOUNTER — Other Ambulatory Visit: Payer: Self-pay | Admitting: Family Medicine

## 2022-09-13 NOTE — Telephone Encounter (Signed)
Patient tried to get this refilled but pharmacy is requesting a new prescription be sent over. Thank you!

## 2022-09-19 ENCOUNTER — Encounter: Payer: Self-pay | Admitting: Family Medicine

## 2022-09-19 ENCOUNTER — Ambulatory Visit: Payer: 59 | Admitting: Family Medicine

## 2022-09-19 VITALS — BP 90/64 | HR 89 | Temp 97.9°F | Ht 68.0 in | Wt 194.1 lb

## 2022-09-19 DIAGNOSIS — H65112 Acute and subacute allergic otitis media (mucoid) (sanguinous) (serous), left ear: Secondary | ICD-10-CM | POA: Diagnosis not present

## 2022-09-19 DIAGNOSIS — J069 Acute upper respiratory infection, unspecified: Secondary | ICD-10-CM

## 2022-09-19 DIAGNOSIS — H65192 Other acute nonsuppurative otitis media, left ear: Secondary | ICD-10-CM

## 2022-09-19 MED ORDER — AMOXICILLIN-POT CLAVULANATE 875-125 MG PO TABS: 1.0000 | ORAL_TABLET | Freq: Two times a day (BID) | ORAL | 0 refills | Status: DC

## 2022-09-19 NOTE — Progress Notes (Unsigned)
    Saud Bail T. Makenzy Krist, MD, [place] at [facility] [place], [address]  Phone: [phone]  FAX: [phone]  Angela Moses - 66 y.o. female  MRN [id number]  Date of Birth: [birth year]  Date: 09/19/2022  PCP: Owens Loffler, MD  Referral: Owens Loffler, MD  Chief Complaint  Patient presents with   Ear Pain    Already on Ciproflxacin-Dexamethasone Drops-Negative Home Covid Test on 09/16/22 Also on Macrobid for UTI   Headache   Cough    With Chest Congestion    Subjective:   Angela Moses is a 66 y.o. very pleasant female patient with Body mass index is 29.52 kg/m. who presents with the following:  Currently on Ciprodex for ear infection and macrobid for UTI.   Had an ear infection in October, and was given ciprodex by ENT.  Not getting over it, and shhe stil has some ear pain.   Last Thursday, developed some chest congestion.  Feeling worse, ear pain is pretty bad.  Has a headache that is really bad and has also felt nauseated.   Having some chest congestion.     Review of Systems is noted in the HPI, as appropriate  Objective:   BP 90/64   Pulse 89   Temp 97.9 F (36.6 C) (Oral)   Ht '5\' 8"'$  (1.727 m)   Wt 194 lb 2 oz (88.1 kg)   SpO2 95%   BMI 29.52 kg/m   GEN: No acute distress; alert,appropriate. PULM: Breathing comfortably in no respiratory distress PSYCH: Normally interactive.   Laboratory and Imaging Data:  Assessment and Plan:   ***

## 2022-09-20 ENCOUNTER — Encounter: Payer: Self-pay | Admitting: Family Medicine

## 2022-11-08 ENCOUNTER — Telehealth: Payer: Self-pay | Admitting: Family Medicine

## 2022-11-08 NOTE — Telephone Encounter (Signed)
Please call back and schedule office visit.

## 2022-11-08 NOTE — Telephone Encounter (Signed)
Patient dropped an log on left foot and is requested order for an x-ray done on her foot. Call back number [phone].

## 2022-11-08 NOTE — Telephone Encounter (Signed)
Patient was scheduled for visit on 11/09/2022 at 2:00 p.m.

## 2022-11-09 ENCOUNTER — Ambulatory Visit: Payer: 59 | Admitting: Family Medicine

## 2022-11-09 ENCOUNTER — Ambulatory Visit (INDEPENDENT_AMBULATORY_CARE_PROVIDER_SITE_OTHER)
Admission: RE | Admit: 2022-11-09 | Discharge: 2022-11-09 | Disposition: A | Discharge status: Home / Self Care | Payer: 59 | Source: Ambulatory Visit | Attending: Family Medicine | Admitting: Family Medicine

## 2022-11-09 ENCOUNTER — Encounter: Payer: Self-pay | Admitting: Family Medicine

## 2022-11-09 VITALS — BP 100/60 | HR 105 | Temp 97.9°F | Ht 68.0 in | Wt 202.1 lb

## 2022-11-09 DIAGNOSIS — M79672 Pain in left foot: Secondary | ICD-10-CM

## 2022-11-09 NOTE — Progress Notes (Unsigned)
Angela Moses T. Angela Culliver, MD, [place] at [facility] [place], [address]  Phone: [phone]  FAX: [place] - 67 y.o. female  MRN [id number]  Date of Birth: [birth year]  Date: 11/09/2022  PCP: Angela Loffler, MD  Referral: Angela Loffler, MD  Chief Complaint  Patient presents with   Foot Pain    Left   Subjective:   Angela Moses is a 67 y.o. very pleasant female patient with Body mass index is 30.73 kg/m. who presents with the following:  She is a very well-known patient for many years, she presents with some left-sided foot pain.  2 weeks ago this past Sunday, and she was sawing and dragging, and log slipped and fell on her foot.  Some days it would hurt, and then other days it would help.   Very well-known patient, she presents with a 2-week history of foot pain after a long slipped and fell and hit the dorsum of her foot.  She put a cam walker boot on it that she had at home yesterday, it is felt somewhat better overnight  Review of Systems is noted in the HPI, as appropriate  Objective:   BP 100/60   Pulse (!) 105   Temp 97.9 F (36.6 C) (Temporal)   Ht '5\' 8"'$  (1.727 m)   Wt 202 lb 2 oz (91.7 kg)   SpO2 96%   BMI 30.73 kg/m   GEN: No acute distress; alert,appropriate. PULM: Breathing comfortably in no respiratory distress PSYCH: Normally interactive.   Mild tenderness at digits 2 through 3 in the metatarsal shaft. All phalanges are nontender, and the remainder of the bony foot and ankle exam is nontender. All ligamentous structures are intact and all tendons are nontender.  Laboratory and Imaging Data: DG Foot Complete Left  Result Date: 11/09/2022 CLINICAL DATA:  Log fell on patient's foot 2 weeks ago. 1st through fifth metatarsal pain. EXAM: LEFT FOOT - COMPLETE 3+ VIEW COMPARISON:  None Available. FINDINGS: Large plantar calcaneal heel spur. Mild chronic  enthesopathic change at the Achilles insertion on the calcaneus. Mild dorsal tarsometatarsal degenerative osteophytosis. Mild hallux valgus. No acute or healing fracture is seen, with attention to the metatarsals. No dislocation. IMPRESSION: 1. No acute or healing fracture is seen, with attention to the metatarsals. 2. Large plantar calcaneal heel spur. Electronically Signed   By: Angela Moses M.D.   On: 11/09/2022 17:26     Assessment and Plan:     ICD-10-CM   1. Acute foot pain, left  M79.672 DG Foot Complete Left     Anticipated bone contusion secondary to direct trauma from log.  There is no fracture.  Wear cam walker boot at home for the next 7 to 10 days, and I suspect this will completely resolve.  Orders placed today for conditions managed today: Orders Placed This Encounter  Procedures   DG Foot Complete Left    Disposition: No follow-ups on file.  [employer] One speech-to-text software was used for transcription in this dictation.  Possible transcriptional errors can occur using Editor, commissioning.   Signed,  Angela Deed. Jin Shockley, MD   Outpatient Encounter Medications as of 11/09/2022  Medication Sig   atorvastatin (LIPITOR) 20 MG tablet TAKE 1 TABLET BY MOUTH ONCE A DAY   Multiple Vitamins-Minerals (MULTIVITAMIN WITH MINERALS) tablet Take 1 tablet by mouth as needed. Takes 3 TIMES A WEEK   triamcinolone cream (KENALOG) 0.1 % as  needed.   [DISCONTINUED] amoxicillin-clavulanate (AUGMENTIN) 875-125 MG tablet Take 1 tablet by mouth 2 (two) times daily.   [DISCONTINUED] nitrofurantoin, macrocrystal-monohydrate, (MACROBID) 100 MG capsule Take 100 mg by mouth 2 (two) times daily.   No facility-administered encounter medications on file as of 11/09/2022.

## 2022-11-10 ENCOUNTER — Encounter: Payer: Self-pay | Admitting: Family Medicine

## 2023-06-07 ENCOUNTER — Encounter: Payer: Self-pay | Admitting: Medical Oncology

## 2023-06-07 ENCOUNTER — Emergency Department: Payer: 59

## 2023-06-07 ENCOUNTER — Emergency Department
Admission: EM | Admit: 2023-06-07 | Discharge: 2023-06-07 | Disposition: A | Discharge status: Home / Self Care | Payer: 59 | Attending: Emergency Medicine | Admitting: Emergency Medicine

## 2023-06-07 DIAGNOSIS — S8001XA Contusion of right knee, initial encounter: Secondary | ICD-10-CM | POA: Diagnosis not present

## 2023-06-07 DIAGNOSIS — S0083XA Contusion of other part of head, initial encounter: Secondary | ICD-10-CM | POA: Insufficient documentation

## 2023-06-07 DIAGNOSIS — S022XXA Fracture of nasal bones, initial encounter for closed fracture: Secondary | ICD-10-CM | POA: Insufficient documentation

## 2023-06-07 DIAGNOSIS — T07XXXA Unspecified multiple injuries, initial encounter: Secondary | ICD-10-CM

## 2023-06-07 DIAGNOSIS — S0990XA Unspecified injury of head, initial encounter: Secondary | ICD-10-CM | POA: Diagnosis present

## 2023-06-07 DIAGNOSIS — W010XXA Fall on same level from slipping, tripping and stumbling without subsequent striking against object, initial encounter: Secondary | ICD-10-CM | POA: Insufficient documentation

## 2023-06-07 DIAGNOSIS — W19XXXA Unspecified fall, initial encounter: Secondary | ICD-10-CM

## 2023-06-07 DIAGNOSIS — Z23 Encounter for immunization: Secondary | ICD-10-CM | POA: Diagnosis not present

## 2023-06-07 MED ORDER — HYDROCODONE-ACETAMINOPHEN 5-325 MG PO TABS: 1.0000 | ORAL_TABLET | Freq: Four times a day (QID) | ORAL | 0 refills | Status: DC | PRN

## 2023-06-07 MED ORDER — ONDANSETRON 4 MG PO TBDP: 4.0000 mg | ORAL_TABLET | Freq: Three times a day (TID) | ORAL | 0 refills | Status: DC | PRN

## 2023-06-07 MED ORDER — HYDROCODONE-ACETAMINOPHEN 5-325 MG PO TABS
1.0000 | ORAL_TABLET | Freq: Once | ORAL | Status: AC
  Administered 2023-06-07: 1 via ORAL
  Filled 2023-06-07: qty 1

## 2023-06-07 MED ORDER — TETANUS-DIPHTH-ACELL PERTUSSIS 5-2.5-18.5 LF-MCG/0.5 IM SUSY
0.5000 mL | PREFILLED_SYRINGE | Freq: Once | INTRAMUSCULAR | Status: AC
  Administered 2023-06-07: 0.5 mL via INTRAMUSCULAR
  Filled 2023-06-07: qty 0.5

## 2023-06-07 MED ORDER — CEPHALEXIN 500 MG PO CAPS: 500.0000 mg | ORAL_CAPSULE | Freq: Three times a day (TID) | ORAL | 0 refills | Status: DC

## 2023-06-07 NOTE — ED Provider Notes (Signed)
[facility] Provider Note    Event Date/Time   First MD Initiated Contact with Patient 06/07/23 [phone]     (approximate)   History   Fall   HPI  GENELLE TOLLER is a 67 y.o. female   presents to the ED with complaint of facial pain, abrasions, right knee pain and abrasions after a fall that occurred this morning approximately 7:15 AM.  Patient states she tripped with some young dogs running around her.  She was alone and it was an unwitnessed fall.  She denies LOC or use of blood thinners.  No visual changes.  Patient has a history of DVT and psoriasis.      Physical Exam   Triage Vital Signs: ED Triage Vitals [06/07/23 0907]  Encounter Vitals Group     BP 131/75     Systolic BP Percentile      Diastolic BP Percentile      Pulse Rate 60     Resp 18     Temp (!) 97.5 F (36.4 C)     Temp Source Oral     SpO2 100 %     Weight 192 lb (87.1 kg)     Height 5\' 8"  (1.727 m)     Head Circumference      Peak Flow      Pain Score 7     Pain Loc      Pain Education      Exclude from Growth Chart     Most recent vital signs: Vitals:   06/07/23 0907  BP: 131/75  Pulse: 60  Resp: 18  Temp: (!) 97.5 F (36.4 C)  SpO2: 100%     General: Awake, no distress.  Alert, talkative, answers questions appropriately. CV:  Good peripheral perfusion.  Resp:  Normal effort.  Abd:  No distention.  Other:  Multiple superficial abrasions noted to the left forehead without active bleeding.  Small puncture like laceration noted to the left cheek laterally and multiple superficial abrasions.  There is also a small 0.3 cm laceration to the bridge of the nose without active bleeding.  No active nasal bleeding at this time.  Moderate soft tissue edema and ecchymosis noted left periorbital area at this time.  Conjunctival clear.  Right anterior knee with superficial abrasions but no active bleeding and no foreign body is present.  Moderate tenderness to light palpation  anteriorly.  Patient is able to flex and extend extremity fully and stand and bear weight.  She was ambulatory while in the emergency department with little assistance.  ED Results / Procedures / Treatments   Labs (all labs ordered are listed, but only abnormal results are displayed) Labs Reviewed - No data to display    RADIOLOGY Right knee x-ray images were reviewed by myself independent of the radiologist and was negative for fracture, dislocation or hardware failure.  CT head, maxillofacial and cervical spine per radiologist shows a mildly displaced nasal fracture.  No acute intracranial changes.  No acute bony abnormalities noted on cervical spine.  PROCEDURES:  Critical Care performed:   Procedures   MEDICATIONS ORDERED IN ED: Medications  Tdap (BOOSTRIX) injection 0.5 mL (0.5 mLs Intramuscular Given 06/07/23 0949)  HYDROcodone-acetaminophen (NORCO/VICODIN) 5-325 MG per tablet 1 tablet (1 tablet Oral Given 06/07/23 1115)     IMPRESSION / MDM / ASSESSMENT AND PLAN / ED COURSE  I reviewed the triage vital signs and the nursing notes.   Differential diagnosis includes, but is not  limited to, head injury, skull fracture, facial fracture, multiple abrasions, contusion right knee, fracture, location, foreign body with lacerations.  67 year old female presents to the ED with complaint of multiple abrasions, contusion, right knee pain after a fall that occurred this morning at 7:15 AM.  CT maxillofacial, head, cervical spine were reassuring with only a minimally displaced nasal fracture present.  Multiple abrasions to the face were cleaned and Steri-Strip was applied to the small laceration to the bridge of her nose.  Patient was made aware that these may become infected and watch for signs of infection.  An antibiotic was sent to the pharmacy along with something for pain.  She is instructed to apply ice to your face to help with swelling.  She and family member aware to return to the  emergency department should she develop any worsening of her symptoms in regards to her injury.      Patient's presentation is most consistent with acute illness / injury with system symptoms.  FINAL CLINICAL IMPRESSION(S) / ED DIAGNOSES   Final diagnoses:  Closed fracture of nasal bone, initial encounter  Contusion of right knee, initial encounter  Contusion of face, initial encounter  Multiple abrasions  Fall, initial encounter     Rx / DC Orders   ED Discharge Orders          Ordered    HYDROcodone-acetaminophen (NORCO/VICODIN) 5-325 MG tablet  Every 6 hours PRN        06/07/23 1141    cephALEXin (KEFLEX) 500 MG capsule  3 times daily        06/07/23 1141    ondansetron (ZOFRAN-ODT) 4 MG disintegrating tablet  Every 8 hours PRN        06/07/23 1141             Note:  This document was prepared using Dragon voice recognition software and may include unintentional dictation errors.   Tommi Rumps, PA-C 06/07/23 1219    Jene Every, MD 06/07/23 [phone]

## 2023-06-07 NOTE — ED Triage Notes (Signed)
Pt reports she got tripped up around her dogs around 0715 and fell. Pt has facial abrasions and rt knee abrasion. Pt denies LOC. No blood thinners.

## 2023-06-07 NOTE — ED Notes (Signed)
See triage notes. Patient was attempting to walk her dogs when she became tangled in the leashes and fell to the ground. Patient has scrapes to the right knee and the left side of her face. The patient's left eye is bruised and swollen. No blood thinners.

## 2023-06-07 NOTE — Discharge Instructions (Signed)
Return to the emergency department if any severe worsening of your symptoms such as nausea, vomiting, dizziness or visual changes.  Your CT scan today is negative for head injury.  You do have a fracture of your nose and will need to see [facility] if you have any problems breathing after the swelling in your face goes down which may take 2 to 3 weeks.  A prescription for hydrocodone and cephalexin was sent to the pharmacy.  The cephalexin is to help prevent infection to the multiple abrasions to your face.  Also clean these areas with mild soap and water daily and allowed to dry completely.  Hydrocodone is if needed for pain and also contains Tylenol with this.  A prescription for Zofran was sent in case there is any nausea.

## 2023-06-08 ENCOUNTER — Telehealth: Payer: Self-pay

## 2023-06-08 NOTE — Transitions of Care (Post Inpatient/ED Visit) (Signed)
   06/08/2023  Name: Angela Moses MRN: [id number] DOB: 08/05/1956  Today's TOC FU Call Status: Today's TOC FU Call Status:: Successful TOC FU Call Completed TOC FU Call Complete Date: 06/08/23 Patient's Name and Date of Birth confirmed.  Transition Care Management Follow-up Telephone Call Date of Discharge: 06/07/23 Discharge Facility: [facility]) Type of Discharge: Emergency Department Reason for ED Visit: Other: (fall with broken nasal bone) How have you been since you were released from the hospital?: Worse (eye has swollen shut) Any questions or concerns?: No  Items Reviewed: Did you receive and understand the discharge instructions provided?: Yes Medications obtained,verified, and reconciled?: Yes (Medications Reviewed) Any new allergies since your discharge?: No Dietary orders reviewed?: NA Do you have support at home?: Yes People in Home: spouse  Medications Reviewed Today: Medications Reviewed Today     Reviewed by Barb Merino, LPN (Licensed Practical Nurse) on 06/08/23 at 1117  Med List Status: <None>   Medication Order Taking? Sig Documenting Provider Last Dose Status Informant  atorvastatin (LIPITOR) 20 MG tablet [id number] No TAKE 1 TABLET BY MOUTH ONCE A DAY Copland, Spencer, MD Taking Active   cephALEXin (KEFLEX) 500 MG capsule [id number]  Take 1 capsule (500 mg total) by mouth 3 (three) times daily. Tommi Rumps, PA-C  Active   HYDROcodone-acetaminophen (NORCO/VICODIN) 5-325 MG tablet [id number]  Take 1 tablet by mouth every 6 (six) hours as needed. Tommi Rumps, PA-C  Active   Multiple Vitamins-Minerals (MULTIVITAMIN WITH MINERALS) tablet [id number] No Take 1 tablet by mouth as needed. Takes 3 TIMES A WEEK [provider] Taking Active   ondansetron (ZOFRAN-ODT) 4 MG disintegrating tablet [id number]  Take 1 tablet (4 mg total) by mouth every 8 (eight) hours as needed for nausea or vomiting. Tommi Rumps, PA-C   Active   triamcinolone cream (KENALOG) 0.1 % [id number] No as needed. [provider] Taking Active             Home Care and Equipment/Supplies: Were Home Health Services Ordered?: NA Any new equipment or medical supplies ordered?: NA  Functional Questionnaire: Do you need assistance with bathing/showering or dressing?: No Do you need assistance with meal preparation?: No Do you need assistance with eating?: No Do you have difficulty maintaining continence: No Do you need assistance with getting out of bed/getting out of a chair/moving?: No Do you have difficulty managing or taking your medications?: No  Follow up appointments reviewed: PCP Follow-up appointment confirmed?: Yes Date of PCP follow-up appointment?: 06/14/23 Follow-up Provider: [facility] Follow-up appointment confirmed?: NA Do you need transportation to your follow-up appointment?: No Do you understand care options if your condition(s) worsen?: Yes-patient verbalized understanding    SIGNATURE [name] LPN [facility] AWV Team Direct dial:  [phone]

## 2023-06-08 NOTE — Transitions of Care (Post Inpatient/ED Visit) (Signed)
   06/08/2023  Name: Angela Moses MRN: [id number] DOB: 03-Dec-1955  Today's TOC FU Call Status: Today's TOC FU Call Status:: Unsuccessful Call (1st Attempt) Unsuccessful Call (1st Attempt) Date: 06/08/23  Attempted to reach the patient regarding the most recent Inpatient/ED visit.  Follow Up Plan: Additional outreach attempts will be made to reach the patient to complete the Transitions of Care (Post Inpatient/ED visit) call.   Signature [name] LPN [facility] AWV Team Direct dial:  [phone]

## 2023-06-14 ENCOUNTER — Inpatient Hospital Stay: Payer: 59 | Admitting: Family Medicine

## 2023-06-15 ENCOUNTER — Ambulatory Visit: Payer: 59 | Admitting: Family Medicine

## 2023-06-15 ENCOUNTER — Encounter: Payer: Self-pay | Admitting: Family Medicine

## 2023-06-15 VITALS — BP 90/60 | HR 66 | Temp 97.9°F | Ht 68.0 in | Wt 192.0 lb

## 2023-06-15 DIAGNOSIS — S8001XD Contusion of right knee, subsequent encounter: Secondary | ICD-10-CM | POA: Diagnosis not present

## 2023-06-15 DIAGNOSIS — S060X0D Concussion without loss of consciousness, subsequent encounter: Secondary | ICD-10-CM | POA: Diagnosis not present

## 2023-06-15 DIAGNOSIS — Z23 Encounter for immunization: Secondary | ICD-10-CM | POA: Diagnosis not present

## 2023-06-15 DIAGNOSIS — W19XXXD Unspecified fall, subsequent encounter: Secondary | ICD-10-CM

## 2023-06-15 DIAGNOSIS — S022XXD Fracture of nasal bones, subsequent encounter for fracture with routine healing: Secondary | ICD-10-CM | POA: Diagnosis not present

## 2023-06-15 NOTE — Progress Notes (Signed)
Patient ID: Angela Moses, female    DOB: [birth year], 67 y.o.   MRN: [id number]  This visit was conducted in person.  BP 90/60 (BP Location: Left Arm, Patient Position: Sitting, Cuff Size: Normal)   Pulse 66   Temp 97.9 F (36.6 C) (Temporal)   Ht 5\' 8"  (1.727 m)   Wt 192 lb (87.1 kg)   SpO2 97%   BMI 29.19 kg/m    CC:  Chief Complaint  Patient presents with   Hospitalization Follow-up    Subjective:   HPI: Angela Moses is a 67 y.o. female patient of Dr. Cyndie Chime  ( PCP currently out on leave) presenting on 06/15/2023 for Hospitalization Follow-up  Seen on June 07, 2023 at [facility] emergency department following fall resulting in closed fracture of nasal bone, contusion of right knee, contusion of face and multiple abrasions. She reported that she tripped over her dogs running around her, no preceding symptoms.  Right knee x-ray images were reviewed by myself independent of the radiologist and was negative for fracture, dislocation or hardware failure.   CT head, maxillofacial and cervical spine per radiologist shows a mildly displaced nasal fracture.  No acute intracranial changes.  No acute bony abnormalities noted on cervical spine.  In the ED she was given Tdap booster, treated for pain with hydrocodone acetaminophen.  She was placed on prophylactic antibiotics.  Recommended ice to face for swelling.  She is not on blood thinners.    Today she reports She has been icing her face.  Swelling has improved, contusion improving on face.  Swelling in knee and abrasion healing well.    Pain improving significantly... no longer requiring pain med.. used tylenol primarily.  She has noted  few seconds of dizziness when  getting up and lying flat... has been ongoing whole time since fall.   No headache.   No mood change.  No new neuro change.. no balance issues.   Has been doing brain rest.     Relevant past medical, surgical, family and social history reviewed and  updated as indicated. Interim medical history since our last visit reviewed. Allergies and medications reviewed and updated. Outpatient Medications Prior to Visit  Medication Sig Dispense Refill   atorvastatin (LIPITOR) 20 MG tablet TAKE 1 TABLET BY MOUTH ONCE A DAY 90 tablet 3   triamcinolone cream (KENALOG) 0.1 % as needed.     cephALEXin (KEFLEX) 500 MG capsule Take 1 capsule (500 mg total) by mouth 3 (three) times daily. 21 capsule 0   HYDROcodone-acetaminophen (NORCO/VICODIN) 5-325 MG tablet Take 1 tablet by mouth every 6 (six) hours as needed. 15 tablet 0   Multiple Vitamins-Minerals (MULTIVITAMIN WITH MINERALS) tablet Take 1 tablet by mouth as needed. Takes 3 TIMES A WEEK     ondansetron (ZOFRAN-ODT) 4 MG disintegrating tablet Take 1 tablet (4 mg total) by mouth every 8 (eight) hours as needed for nausea or vomiting. 10 tablet 0   No facility-administered medications prior to visit.     Per HPI unless specifically indicated in ROS section below Review of Systems Objective:  BP 90/60 (BP Location: Left Arm, Patient Position: Sitting, Cuff Size: Normal)   Pulse 66   Temp 97.9 F (36.6 C) (Temporal)   Ht 5\' 8"  (1.727 m)   Wt 192 lb (87.1 kg)   SpO2 97%   BMI 29.19 kg/m   Wt Readings from Last 3 Encounters:  06/15/23 192 lb (87.1 kg)  06/07/23 192 lb (87.1 kg)  11/09/22 202 lb 2 oz (91.7 kg)      Physical Exam    Results for orders placed or performed in visit on 08/18/22  TSH  Result Value Ref Range   TSH 2.47 0.35 - 5.50 uIU/mL  Hemoglobin A1c  Result Value Ref Range   Hgb A1c MFr Bld 6.2 4.6 - 6.5 %  CBC with Differential/Platelet  Result Value Ref Range   WBC 6.6 4.0 - 10.5 K/uL   RBC 4.94 3.87 - 5.11 Mil/uL   Hemoglobin 14.3 12.0 - 15.0 g/dL   HCT 16.1 09.6 - 04.5 %   MCV 88.8 78.0 - 100.0 fl   MCHC 32.6 30.0 - 36.0 g/dL   RDW 40.9 81.1 - 91.4 %   Platelets 209.0 150.0 - 400.0 K/uL   Neutrophils Relative % 59.3 43.0 - 77.0 %   Lymphocytes Relative 26.5 12.0  - 46.0 %   Monocytes Relative 8.5 3.0 - 12.0 %   Eosinophils Relative 5.2 (H) 0.0 - 5.0 %   Basophils Relative 0.5 0.0 - 3.0 %   Neutro Abs 3.9 1.4 - 7.7 K/uL   Lymphs Abs 1.7 0.7 - 4.0 K/uL   Monocytes Absolute 0.6 0.1 - 1.0 K/uL   Eosinophils Absolute 0.3 0.0 - 0.7 K/uL   Basophils Absolute 0.0 0.0 - 0.1 K/uL  Basic metabolic panel  Result Value Ref Range   Sodium 144 135 - 145 mEq/L   Potassium 4.7 3.5 - 5.1 mEq/L   Chloride 108 96 - 112 mEq/L   CO2 31 19 - 32 mEq/L   Glucose, Bld 98 70 - 99 mg/dL   BUN 13 6 - 23 mg/dL   Creatinine, Ser 7.82 0.40 - 1.20 mg/dL   GFR 95.62 (L) >13.08 mL/min   Calcium 9.1 8.4 - 10.5 mg/dL  Hepatic function panel  Result Value Ref Range   Total Bilirubin 0.6 0.2 - 1.2 mg/dL   Bilirubin, Direct 0.1 0.0 - 0.3 mg/dL   Alkaline Phosphatase 95 39 - 117 U/L   AST 17 0 - 37 U/L   ALT 18 0 - 35 U/L   Total Protein 6.4 6.0 - 8.3 g/dL   Albumin 4.0 3.5 - 5.2 g/dL  Lipid panel  Result Value Ref Range   Cholesterol 171 0 - 200 mg/dL   Triglycerides 657.8 (H) 0.0 - 149.0 mg/dL   HDL 46.96 >29.52 mg/dL   VLDL 84.1 0.0 - 32.4 mg/dL   LDL Cholesterol 82 0 - 99 mg/dL   Total CHOL/HDL Ratio 3    NonHDL 113.29     Assessment and Plan  Closed fracture of nasal bone with routine healing, subsequent encounter  Contusion of right knee, subsequent encounter  Fall, accidental, subsequent encounter  Concussion without loss of consciousness, subsequent encounter  No preceding symptoms for fall, accidental due to tripping. Healing right knee abrasion. Nasal fracture now with decreased swelling and minimal pain.  No clear indication for ear nose and throat referral. Patient with some symptoms of mild concussion, recommended total brain rest and increasing brain activity and physical activity gradually as tolerated. Reviewed signs and symptoms of subacute intracranial hemorrhage for patient to be aware of.  No current red flags.  Return and ER precautions  provided.  No follow-ups on file.   Angela Nora, MD

## 2023-07-11 LAB — HM MAMMOGRAPHY

## 2023-07-12 ENCOUNTER — Encounter: Payer: Self-pay | Admitting: Family Medicine

## 2023-08-30 ENCOUNTER — Other Ambulatory Visit: Payer: Self-pay | Admitting: Family Medicine

## 2023-08-30 NOTE — Telephone Encounter (Signed)
Patient has been scheduled

## 2023-08-30 NOTE — Telephone Encounter (Signed)
Please schedule CPE with fasting labs prior with Dr. Copland.  

## 2023-10-05 ENCOUNTER — Telehealth: Payer: Self-pay | Admitting: *Deleted

## 2023-10-05 DIAGNOSIS — Z79899 Other long term (current) drug therapy: Secondary | ICD-10-CM

## 2023-10-05 DIAGNOSIS — E782 Mixed hyperlipidemia: Secondary | ICD-10-CM

## 2023-10-05 DIAGNOSIS — Z131 Encounter for screening for diabetes mellitus: Secondary | ICD-10-CM

## 2023-10-05 NOTE — Telephone Encounter (Signed)
-----   Message from Vincenza Hews sent at 10/05/2023  3:18 PM EST ----- Regarding: Lab Wed 10/18/23 Hello,  Patient is coming in for CPE labs on Wednesday 10/18/23. Can we get orders please.   Thanks

## 2023-10-18 ENCOUNTER — Other Ambulatory Visit (INDEPENDENT_AMBULATORY_CARE_PROVIDER_SITE_OTHER): Payer: Medicare Other

## 2023-10-18 DIAGNOSIS — Z79899 Other long term (current) drug therapy: Secondary | ICD-10-CM | POA: Diagnosis not present

## 2023-10-18 DIAGNOSIS — Z131 Encounter for screening for diabetes mellitus: Secondary | ICD-10-CM | POA: Diagnosis not present

## 2023-10-18 DIAGNOSIS — E782 Mixed hyperlipidemia: Secondary | ICD-10-CM | POA: Diagnosis not present

## 2023-10-18 LAB — CBC WITH DIFFERENTIAL/PLATELET
Basophils Absolute: 0 10*3/uL (ref 0.0–0.1)
Basophils Relative: 0.6 % (ref 0.0–3.0)
Eosinophils Absolute: 0.3 10*3/uL (ref 0.0–0.7)
Eosinophils Relative: 4.2 % (ref 0.0–5.0)
HCT: 36.8 % (ref 36.0–46.0)
Hemoglobin: 11.7 g/dL — ABNORMAL LOW (ref 12.0–15.0)
Lymphocytes Relative: 22.1 % (ref 12.0–46.0)
Lymphs Abs: 1.5 10*3/uL (ref 0.7–4.0)
MCHC: 31.7 g/dL (ref 30.0–36.0)
MCV: 83.1 fL (ref 78.0–100.0)
Monocytes Absolute: 0.6 10*3/uL (ref 0.1–1.0)
Monocytes Relative: 8.5 % (ref 3.0–12.0)
Neutro Abs: 4.4 10*3/uL (ref 1.4–7.7)
Neutrophils Relative %: 64.6 % (ref 43.0–77.0)
Platelets: 279 10*3/uL (ref 150.0–400.0)
RBC: 4.42 Mil/uL (ref 3.87–5.11)
RDW: 15.5 % (ref 11.5–15.5)
WBC: 6.8 10*3/uL (ref 4.0–10.5)

## 2023-10-18 LAB — HEPATIC FUNCTION PANEL
ALT: 17 U/L (ref 0–35)
AST: 15 U/L (ref 0–37)
Albumin: 4 g/dL (ref 3.5–5.2)
Alkaline Phosphatase: 97 U/L (ref 39–117)
Bilirubin, Direct: 0.1 mg/dL (ref 0.0–0.3)
Total Bilirubin: 0.4 mg/dL (ref 0.2–1.2)
Total Protein: 6.3 g/dL (ref 6.0–8.3)

## 2023-10-18 LAB — LIPID PANEL
Cholesterol: 200 mg/dL (ref 0–200)
HDL: 71.8 mg/dL (ref >39.00)
LDL Cholesterol: 106 mg/dL — ABNORMAL HIGH (ref 0–99)
NonHDL: 128.61
Total CHOL/HDL Ratio: 3
Triglycerides: 115 mg/dL (ref 0.0–149.0)
VLDL: 23 mg/dL (ref 0.0–40.0)

## 2023-10-18 LAB — BASIC METABOLIC PANEL
BUN: 19 mg/dL (ref 6–23)
CO2: 29 meq/L (ref 19–32)
Calcium: 8.9 mg/dL (ref 8.4–10.5)
Chloride: 107 meq/L (ref 96–112)
Creatinine, Ser: 0.92 mg/dL (ref 0.40–1.20)
GFR: 65.47 mL/min (ref >60.00)
Glucose, Bld: 89 mg/dL (ref 70–99)
Potassium: 4.4 meq/L (ref 3.5–5.1)
Sodium: 140 meq/L (ref 135–145)

## 2023-10-18 LAB — TSH: TSH: 2.31 u[IU]/mL (ref 0.35–5.50)

## 2023-10-18 LAB — HEMOGLOBIN A1C: Hgb A1c MFr Bld: 6.3 % (ref 4.6–6.5)

## 2023-10-24 NOTE — Progress Notes (Signed)
 Angela Moses T. Angela Fishburn, MD, CAQ Sports Medicine [facility] at [facility] [address] [place], [address]  Phone: [phone]  FAX: [phone]  Angela Moses - 68 y.o. female  MRN [id number]  Date of Birth: 08/15/1956  Date: 10/25/2023  PCP: Scherrie Curt, MD  Referral: Scherrie Curt, MD  Chief Complaint  Patient presents with   Welcome to Medicare   Patient Care Team: Scherrie Curt, MD as PCP - General Subjective:   Angela Moses is a 68 y.o. pleasant patient who presents for a welcome to Medicare exam.  Health Maintenance Summary Reviewed and updated, unless pt declines services.  Tobacco History Reviewed. Non-smoker Alcohol: No concerns, no excessive use Exercise Habits: Some activity, rec at least 30 mins 5 times a week, regular farm work STD concerns: none Drug Use: None Birth control method: n/a Menses regular: n/a Lumps or breast concerns: no Breast Cancer Family History: no  Prevnar-20 -she will hold on this for now DEXA -she is think she has had this from gynecology Covid booster Hysterectomy  Major fall earlier in the year, broken nose, concussion, diffuse soft tissue injury  Wt Readings from Last 3 Encounters:  10/25/23 189 lb 4 oz (85.8 kg)  06/15/23 192 lb (87.1 kg)  06/07/23 192 lb (87.1 kg)    She has lost over 10 pounds  Health Maintenance  Topic Date Due   COVID-19 Vaccine (3 - Pfizer risk series) 04/02/2020   DEXA SCAN  Never done   Pneumonia Vaccine 84+ Years old (1 of 1 - PCV) 10/24/2024 (Originally 07/27/2023)   Medicare Annual Wellness (AWV)  10/24/2024   MAMMOGRAM  07/10/2025   Colonoscopy  12/03/2031   DTaP/Tdap/Td (5 - Td or Tdap) 06/06/2033   INFLUENZA VACCINE  Completed   Hepatitis C Screening  Completed   HIV Screening  Completed   Zoster Vaccines- Shingrix  Completed   HPV VACCINES  Aged Out   Immunization History  Administered Date(s) Administered   Influenza Whole 07/10/2010,  10/01/2010   Influenza, Seasonal, Injecte, Preservative Fre 06/15/2023   Influenza,inj,Quad PF,6+ Mos 08/17/2015, 08/23/2017, 07/29/2019, 08/17/2020, 08/23/2021, 08/25/2022   Influenza-Unspecified 07/10/2018   PFIZER(Purple Top)SARS-COV-2 Vaccination 02/13/2020, 03/05/2020   Td 03/10/2006, 10/01/2010   Tdap 08/23/2021, 06/07/2023   Zoster Recombinant(Shingrix) 08/23/2021, 11/24/2021    Patient Active Problem List   Diagnosis Date Noted   Mixed hyperlipidemia 06/24/2009    Priority: Medium    Psoriasis 03/11/2015    Priority: Low   CERVICALGIA 12/27/2010    Past Medical History:  Diagnosis Date   DVT (deep venous thrombosis) (HCC)    BLOOD CLOT RIGHT CALF AFTER KNEE REPLACEMENT   HLD (hyperlipidemia)    Psoriasis 03/11/2015    Past Surgical History:  Procedure Laterality Date   ABDOMINAL HYSTERECTOMY  10/11/1995   EXCISION HAGLUND'S DEFORMITY WITH ACHILLES TENDON REPAIR Right 08/08/2013   Procedure: RIGHT EXCISION HAGLUND'S DEFORMITY  WITH ACHILLES DEBRIDEMENT AND RECONSTRUCTION;  Surgeon: Amada Backer, MD;  Location: [facility];  Service: Orthopedics;  Laterality: Right;   GASTROC RECESSION EXTREMITY Right 08/08/2013   Procedure: GASTROCNEMIUS RECESSION ;  Surgeon: Amada Backer, MD;  Location: [facility];  Service: Orthopedics;  Laterality: Right;   KNEE CLOSED REDUCTION Left 10/30/2017   Procedure: CLOSED MANIPULATION KNEE;  Surgeon: Liliane Rei, MD;  Location: [facility];  Service: Orthopedics;  Laterality: Left;   TONSILLECTOMY     TOTAL KNEE ARTHROPLASTY Left 09/11/2017   Procedure: LEFT TOTAL KNEE ARTHROPLASTY;  Surgeon:  Liliane Rei, MD;  Location: [facility];  Service: Orthopedics;  Laterality: Left;   TOTAL KNEE ARTHROPLASTY Right    [id number]   TYMPANIC MEMBRANE REPAIR     TYMPANOSTOMY TUBE PLACEMENT     lt    WISDOM TOOTH EXTRACTION      Family History  Problem Relation Age of Onset   Dementia Mother    Diabetes Mother     Parkinsonism Father    Macular degeneration Father    Breast cancer Maternal Grandmother        cause of death   Heart attack Maternal Grandfather        cause of death   Breast cancer Paternal Grandmother        cause of death   Heart attack Paternal Grandfather        cause of death   Colon cancer Neg Hx    Colon polyps Neg Hx    Esophageal cancer Neg Hx    Rectal cancer Neg Hx    Stomach cancer Neg Hx     Social History   Social History Narrative   Not on file    Past Medical History, Surgical History, Social History, Family History, Problem List, Medications, and Allergies have been reviewed and updated if relevant.  Review of Systems: Pertinent positives are listed above.  Otherwise, a full 14 point review of systems has been done in full and it is negative except where it is noted positive.  Objective:   BP 100/60 (BP Location: Left Arm, Patient Position: Sitting, Cuff Size: Large)   Pulse 77   Temp 97.7 F (36.5 C) (Temporal)   Ht 5' 7.5" (1.715 m)   Wt 189 lb 4 oz (85.8 kg)   SpO2 97%   BMI 29.20 kg/m     11/09/2022    2:08 PM  Fall Risk  Falls in the past year? 0  Was there an injury with Fall? 0  Fall Risk Category Calculator 0  Patient at Risk for Falls Due to No Fall Risks  Fall risk Follow up Falls evaluation completed   Ideal Body Weight: Weight in (lb) to have BMI = 25: 161.7 Hearing Screening  Method: Audiometry   500Hz  1000Hz  2000Hz  4000Hz   Right ear 20 20 20 20   Left ear 20 0 20 20   Vision Screening   Right eye Left eye Both eyes  Without correction 20/20 20/13 20/15   With correction         11/09/2022    2:09 PM 08/25/2022    9:53 AM 08/23/2021   10:12 AM 08/17/2020    2:32 PM 07/29/2019    2:51 PM  Depression screen PHQ 2/9  Decreased Interest 0 0 0 0 0  Down, Depressed, Hopeless 0 0 0 0 0  PHQ - 2 Score 0 0 0 0 0     GEN: well developed, well nourished, no acute distress Eyes: conjunctiva and lids normal, PERRLA, EOMI ENT:  TM clear, nares clear, oral exam WNL Neck: supple, no lymphadenopathy, no thyromegaly, no JVD Pulm: clear to auscultation and percussion, respiratory effort normal CV: regular rate and rhythm, S1-S2, no murmur, rub or gallop, no bruits Chest: no scars, masses, no lumps BREAST: no lumps, no axillary LAD, no nipple discharge GI: soft, non-tender; no hepatosplenomegaly, masses; active bowel sounds all quadrants GU: Normal external female genitalia. Cervix appears intact without lesions or irritation. Vaginal canal normal without ulceration or lesion. Cervix NT to exam. Ovaries neither enlarged nor tender. (Chaperoned  examination by female staff) Lymph: no cervical, axillary or inguinal adenopathy MSK: gait normal, muscle tone and strength WNL, no joint swelling, effusions, discoloration, crepitus  SKIN: clear, good turgor, color WNL, no rashes, lesions, or ulcerations Neuro: normal mental status, normal strength, sensation, and motion Psych: alert; oriented to person, place and time, normally interactive and not anxious or depressed in appearance.  All labs reviewed with patient.  Results for orders placed or performed in visit on 10/18/23  Hepatic Function Panel   Collection Time: 10/18/23  8:16 AM  Result Value Ref Range   Total Bilirubin 0.4 0.2 - 1.2 mg/dL   Bilirubin, Direct 0.1 0.0 - 0.3 mg/dL   Alkaline Phosphatase 97 39 - 117 U/L   AST 15 0 - 37 U/L   ALT 17 0 - 35 U/L   Total Protein 6.3 6.0 - 8.3 g/dL   Albumin 4.0 3.5 - 5.2 g/dL  Basic metabolic panel   Collection Time: 10/18/23  8:16 AM  Result Value Ref Range   Sodium 140 135 - 145 mEq/L   Potassium 4.4 3.5 - 5.1 mEq/L   Chloride 107 96 - 112 mEq/L   CO2 29 19 - 32 mEq/L   Glucose, Bld 89 70 - 99 mg/dL   BUN 19 6 - 23 mg/dL   Creatinine, Ser 1.61 0.40 - 1.20 mg/dL   GFR 09.60 >45.40 mL/min   Calcium  8.9 8.4 - 10.5 mg/dL  TSH   Collection Time: 10/18/23  8:16 AM  Result Value Ref Range   TSH 2.31 0.35 - 5.50 uIU/mL   CBC with Differential/Platelet   Collection Time: 10/18/23  8:16 AM  Result Value Ref Range   WBC 6.8 4.0 - 10.5 K/uL   RBC 4.42 3.87 - 5.11 Mil/uL   Hemoglobin 11.7 (L) 12.0 - 15.0 g/dL   HCT 98.1 19.1 - 47.8 %   MCV 83.1 78.0 - 100.0 fl   MCHC 31.7 30.0 - 36.0 g/dL   RDW 29.5 62.1 - 30.8 %   Platelets 279.0 150.0 - 400.0 K/uL   Neutrophils Relative % 64.6 43.0 - 77.0 %   Lymphocytes Relative 22.1 12.0 - 46.0 %   Monocytes Relative 8.5 3.0 - 12.0 %   Eosinophils Relative 4.2 0.0 - 5.0 %   Basophils Relative 0.6 0.0 - 3.0 %   Neutro Abs 4.4 1.4 - 7.7 K/uL   Lymphs Abs 1.5 0.7 - 4.0 K/uL   Monocytes Absolute 0.6 0.1 - 1.0 K/uL   Eosinophils Absolute 0.3 0.0 - 0.7 K/uL   Basophils Absolute 0.0 0.0 - 0.1 K/uL  Lipid panel   Collection Time: 10/18/23  8:16 AM  Result Value Ref Range   Cholesterol 200 0 - 200 mg/dL   Triglycerides 657.8 0.0 - 149.0 mg/dL   HDL 46.96 >29.52 mg/dL   VLDL 84.1 0.0 - 32.4 mg/dL   LDL Cholesterol 401 (H) 0 - 99 mg/dL   Total CHOL/HDL Ratio 3    NonHDL 128.61   Hemoglobin A1c   Collection Time: 10/18/23  8:16 AM  Result Value Ref Range   Hgb A1c MFr Bld 6.3 4.6 - 6.5 %    No results found.  Assessment and Plan:     ICD-10-CM   1. Healthcare maintenance  Z00.00      Mild normochromic anemia.  Likely iron deficiency.  Recent colonoscopy that was reassuring. No bleeding in urine, black tarry stool, or bright red blood per rectum.  She is going to think about getting her  vaccination at a later time.  Patient Instructions  Vitamin D, 2,000 units a day  Calcium  800 mg a day  Iron Sulfate 325 mg each day   Health Maintenance Exam: The patient's preventative maintenance and recommended screening tests for an annual wellness exam were reviewed in full today. Brought up to date unless services declined.  Counselled on the importance of diet, exercise, and its role in overall health and mortality. The patient's FH and SH was reviewed,  including their home life, tobacco status, and drug and alcohol status.  Follow-up in 1 year for physical exam or additional follow-up below.  I have personally reviewed the Medicare Annual Wellness questionnaire and have noted 1. The patient's medical and social history 2. Their use of alcohol, tobacco or illicit drugs 3. Their current medications and supplements 4. The patient's functional ability including ADL's, fall risks, home safety risks and hearing or visual             impairment. 5. Diet and physical activities 6. Evidence for depression or mood disorders 7. Reviewed Updated provider list, see scanned forms and [facility] Snapshot.  8. Reviewed whether or not the patient has HCPOA or living will, and discussed what this means with the patient.  Recommended she bring in a copy for his chart in [facility].  The patients weight, height, BMI and visual acuity have been recorded in the chart I have made referrals, counseling and provided education to the patient based review of the above and I have provided the pt with a written personalized care plan for preventive services.  I have provided the patient with a copy of your personalized plan for preventive services. Instructed to take the time to review along with their updated medication list.  Disposition: No follow-ups on file.  No future appointments.   No orders of the defined types were placed in this encounter.  There are no discontinued medications. No orders of the defined types were placed in this encounter.   Signed,  Ranny Bye. Danyia Borunda, MD   Allergies as of 10/25/2023       Reactions   Latex Swelling        Medication List        Accurate as of October 25, 2023  3:45 PM. If you have any questions, ask your nurse or doctor.          atorvastatin  20 MG tablet Commonly known as: LIPITOR TAKE ONE TABLET BY MOUTH ONCE A DAY   triamcinolone  cream 0.1 % Commonly known as: KENALOG  as needed.

## 2023-10-25 ENCOUNTER — Encounter: Payer: Self-pay | Admitting: Family Medicine

## 2023-10-25 ENCOUNTER — Ambulatory Visit: Payer: Medicare Other | Admitting: Family Medicine

## 2023-10-25 VITALS — BP 100/60 | HR 77 | Temp 97.7°F | Ht 67.5 in | Wt 189.2 lb

## 2023-10-25 DIAGNOSIS — Z0000 Encounter for general adult medical examination without abnormal findings: Secondary | ICD-10-CM

## 2023-10-25 NOTE — Patient Instructions (Addendum)
 Vitamin D, 2,000 units a day  Calcium  800 mg a day  Iron Sulfate 325 mg each day

## 2023-11-16 ENCOUNTER — Telehealth: Payer: Self-pay | Admitting: Family Medicine

## 2023-11-16 NOTE — Telephone Encounter (Signed)
 Her Tdap is up to date - no need for vaccination  Immunization History  Administered Date(s) Administered   Influenza Whole 07/10/2010, 10/01/2010   Influenza, Seasonal, Injecte, Preservative Fre 06/15/2023   Influenza,inj,Quad PF,6+ Mos 08/17/2015, 08/23/2017, 07/29/2019, 08/17/2020, 08/23/2021, 08/25/2022   Influenza-Unspecified 07/10/2018   PFIZER(Purple Top)SARS-COV-2 Vaccination 02/13/2020, 03/05/2020   Td 03/10/2006, 10/01/2010   Tdap 08/23/2021, 06/07/2023   Zoster Recombinant(Shingrix) 08/23/2021, 11/24/2021

## 2023-11-16 NOTE — Telephone Encounter (Signed)
Angela Moses notified as instructed by telephone.  Patient states understanding.

## 2023-11-16 NOTE — Telephone Encounter (Signed)
 Copied from CRM [id number]. Topic: General - Other >> Nov 16, 2023  8:41 AM Robinson H wrote: Reason for CRM: Patient is inquiring if she should get the whooping cough vaccine and booster for grandparents. Will be a grandparent next year and wants to know is she should get it and if so can you get it without the tetanus part. Patient states she does not have voicemail if you could twice if no answer the first time.  Angela Moses [phone]

## 2023-11-25 ENCOUNTER — Other Ambulatory Visit: Payer: Self-pay | Admitting: Family Medicine

## 2024-03-22 ENCOUNTER — Encounter: Payer: Self-pay | Admitting: Primary Care

## 2024-03-22 ENCOUNTER — Ambulatory Visit (INDEPENDENT_AMBULATORY_CARE_PROVIDER_SITE_OTHER): Admitting: Primary Care

## 2024-03-22 ENCOUNTER — Ambulatory Visit: Payer: Self-pay | Admitting: Primary Care

## 2024-03-22 VITALS — BP 124/80 | HR 65 | Temp 97.4°F | Ht 67.5 in | Wt 191.0 lb

## 2024-03-22 DIAGNOSIS — J069 Acute upper respiratory infection, unspecified: Secondary | ICD-10-CM | POA: Diagnosis not present

## 2024-03-22 LAB — POC COVID19 BINAXNOW: SARS Coronavirus 2 Ag: NEGATIVE

## 2024-03-22 MED ORDER — BENZONATATE 200 MG PO CAPS: 200.0000 mg | ORAL_CAPSULE | Freq: Three times a day (TID) | ORAL | 0 refills | Status: DC | PRN

## 2024-03-22 NOTE — Patient Instructions (Signed)
 Start Benzonatate capsules for cough. Take 1 capsule by mouth three times daily as needed for cough.  Please contact us  early next week if your symptoms are worse.  It was a pleasure meeting you!

## 2024-03-22 NOTE — Progress Notes (Signed)
 Subjective:    Patient ID: Angela Moses, female    DOB: September 02, 1956, 68 y.o.   MRN: [id number]  HPI  Angela Moses is a very pleasant 68 y.o. female patient of Dr. Geralyn Knee without a significant medical history who presents today to discuss acute cough.  Symptom onset 4 days ago with scratchy throat, postnasal drip. She then developed chest congestion, cough, headaches, chills, body ache, and nasal congestion. Today she's feeling worse.  She denies fevers, sick contacts. She's been taking Tylenol  and Mucinex  with some improvement.      Review of Systems  Constitutional:  Positive for chills and fatigue.  HENT:  Positive for congestion and postnasal drip.   Respiratory:  Positive for cough.   Neurological:  Positive for headaches.         Past Medical History:  Diagnosis Date   DVT (deep venous thrombosis) (HCC)    BLOOD CLOT RIGHT CALF AFTER KNEE REPLACEMENT   HLD (hyperlipidemia)    Psoriasis 03/11/2015    Social History   Socioeconomic History   Marital status: Married    Spouse name: Not on file   Number of children: Not on file   Years of education: Not on file   Highest education level: Not on file  Occupational History   Not on file  Tobacco Use   Smoking status: Never   Smokeless tobacco: Never  Vaping Use   Vaping status: Never Used  Substance and Sexual Activity   Alcohol use: Yes    Comment: occasional   Drug use: No   Sexual activity: Yes  Other Topics Concern   Not on file  Social History Narrative   Not on file   Social Drivers of Health   Financial Resource Strain: Not on file  Food Insecurity: Low Risk  (11/16/2023)   Received from [facility]   Hunger Vital Sign    Within the past 12 months, you worried that your food would run out before you got money to buy more: Never true    Within the past 12 months, the food you bought just didn't last and you didn't have money to get more. : Never true  Transportation Needs: No  Transportation Needs (11/16/2023)   Received from [facility]    In the past 12 months, has lack of reliable transportation kept you from medical appointments, meetings, work or from getting things needed for daily living? : No  Physical Activity: Not on file  Stress: Not on file  Social Connections: Not on file  Intimate Partner Violence: Not on file    Past Surgical History:  Procedure Laterality Date   ABDOMINAL HYSTERECTOMY  10/11/1995   EXCISION HAGLUND'S DEFORMITY WITH ACHILLES TENDON REPAIR Right 08/08/2013   Procedure: RIGHT EXCISION HAGLUND'S DEFORMITY  WITH ACHILLES DEBRIDEMENT AND RECONSTRUCTION;  Surgeon: Amada Backer, MD;  Location: [facility];  Service: Orthopedics;  Laterality: Right;   GASTROC RECESSION EXTREMITY Right 08/08/2013   Procedure: GASTROCNEMIUS RECESSION ;  Surgeon: Amada Backer, MD;  Location: [facility];  Service: Orthopedics;  Laterality: Right;   KNEE CLOSED REDUCTION Left 10/30/2017   Procedure: CLOSED MANIPULATION KNEE;  Surgeon: Liliane Rei, MD;  Location: [facility];  Service: Orthopedics;  Laterality: Left;   TONSILLECTOMY     TOTAL KNEE ARTHROPLASTY Left 09/11/2017   Procedure: LEFT TOTAL KNEE ARTHROPLASTY;  Surgeon: Liliane Rei, MD;  Location: [facility];  Service: Orthopedics;  Laterality: Left;   TOTAL KNEE ARTHROPLASTY  Right    [id number]   TYMPANIC MEMBRANE REPAIR     TYMPANOSTOMY TUBE PLACEMENT     lt     Family History  Problem Relation Age of Onset   Dementia Mother    Diabetes Mother    Parkinsonism Father    Macular degeneration Father    Breast cancer Maternal Grandmother        cause of death   Heart attack Maternal Grandfather        cause of death   Breast cancer Paternal Grandmother        cause of death   Heart attack Paternal Grandfather        cause of death   Colon cancer Neg Hx    Colon polyps Neg Hx    Esophageal cancer Neg Hx    Rectal cancer Neg Hx    Stomach cancer Neg  Hx     Allergies  Allergen Reactions   Latex Swelling    Current Outpatient Medications on File Prior to Visit  Medication Sig Dispense Refill   atorvastatin  (LIPITOR) 20 MG tablet TAKE ONE TABLET BY MOUTH ONCE A DAY 100 tablet 2   triamcinolone  cream (KENALOG ) 0.1 % as needed.     No current facility-administered medications on file prior to visit.    BP 124/80   Pulse 65   Temp (!) 97.4 F (36.3 C) (Temporal)   Ht 5' 7.5 (1.715 m)   Wt 191 lb (86.6 kg)   SpO2 99%   BMI 29.47 kg/m  Objective:   Physical Exam Constitutional:      Appearance: She is ill-appearing.  HENT:     Right Ear: Tympanic membrane and ear canal normal.     Left Ear: Tympanic membrane and ear canal normal.     Nose: No mucosal edema.     Right Sinus: No maxillary sinus tenderness or frontal sinus tenderness.     Left Sinus: No maxillary sinus tenderness or frontal sinus tenderness.     Mouth/Throat:     Mouth: Mucous membranes are moist.   Eyes:     Conjunctiva/sclera: Conjunctivae normal.    Cardiovascular:     Rate and Rhythm: Normal rate and regular rhythm.  Pulmonary:     Effort: Pulmonary effort is normal.     Breath sounds: Examination of the right-upper field reveals rhonchi. Examination of the left-upper field reveals rhonchi. Rhonchi present. No wheezing.   Musculoskeletal:     Cervical back: Neck supple.   Skin:    General: Skin is warm and dry.           Assessment & Plan:  Viral URI with cough Assessment & Plan: Symptoms and presentation today consistent with viral etiology. We discussed that with viral symptoms people tend to feel worse before they get better.  She should be feeling better by Monday next week, but if she is feeling worse at that time then she has been instructed to contact us . Would recommend antibiotic course at that time.   Rapid Covid-19 test negative today. Start Benzonatate capsules for cough. Take 1 capsule by mouth three times daily as  needed for cough.  She will update early next week.  Orders: -     Benzonatate; Take 1 capsule (200 mg total) by mouth 3 (three) times daily as needed for cough.  Dispense: 15 capsule; Refill: 0        [name], NP

## 2024-03-22 NOTE — Assessment & Plan Note (Signed)
 Symptoms and presentation today consistent with viral etiology. We discussed that with viral symptoms people tend to feel worse before they get better.  She should be feeling better by Monday next week, but if she is feeling worse at that time then she has been instructed to contact us . Would recommend antibiotic course at that time.   Rapid Covid-19 test negative today. Start Benzonatate capsules for cough. Take 1 capsule by mouth three times daily as needed for cough.  She will update early next week.

## 2024-03-22 NOTE — Addendum Note (Signed)
 Addended by: Kyle Pho on: 03/22/2024 09:26 AM   Modules accepted: Orders

## 2024-08-12 LAB — HM MAMMOGRAPHY

## 2024-08-13 ENCOUNTER — Ambulatory Visit (INDEPENDENT_AMBULATORY_CARE_PROVIDER_SITE_OTHER)

## 2024-08-13 ENCOUNTER — Encounter: Payer: Self-pay | Admitting: Family Medicine

## 2024-08-13 DIAGNOSIS — Z23 Encounter for immunization: Secondary | ICD-10-CM | POA: Diagnosis not present

## 2024-08-22 ENCOUNTER — Ambulatory Visit: Admitting: Family Medicine

## 2024-08-25 NOTE — Progress Notes (Unsigned)
     Angela Moses T. Angela Hoeg, MD, CAQ Sports Medicine [facility] [address] [place], [address]  Phone: [phone]  FAX: [phone]  Angela Moses - 68 y.o. female  MRN [id number]  Date of Birth: [birth year]  Date: 08/26/2024  PCP: Watt Mirza, MD  Referral: Watt Mirza, MD  No chief complaint on file.  Subjective:   Angela Moses is a 68 y.o. very pleasant female patient with There is no height or weight on file to calculate BMI. who presents with the following:  Discussed the use of AI scribe software for clinical note transcription with the patient, who gave verbal consent to proceed.  I have known Angela Moses for many years, and she presents today with ongoing neck pain. History of Present Illness     Review of Systems is noted in the HPI, as appropriate  Objective:   There were no vitals taken for this visit.  GEN: No acute distress; alert,appropriate. PULM: Breathing comfortably in no respiratory distress PSYCH: Normally interactive.   Laboratory and Imaging Data:  Assessment and Plan:   No diagnosis found. Assessment & Plan   Medication Management during today's office visit: No orders of the defined types were placed in this encounter.  There are no discontinued medications.  Orders placed today for conditions managed today: No orders of the defined types were placed in this encounter.   Disposition: No follow-ups on file.  [employer] speech-to-text software was used for transcription in this dictation.  Possible transcriptional errors can occur using Animal nutritionist.   Signed,  Mirza DASEN. Shelbey Spindler, MD   Outpatient Encounter Medications as of 08/26/2024  Medication Sig   atorvastatin  (LIPITOR) 20 MG tablet TAKE ONE TABLET BY MOUTH ONCE A DAY   benzonatate  (TESSALON ) 200 MG capsule Take 1 capsule (200 mg total) by mouth 3 (three) times daily as needed for cough.   triamcinolone  cream  (KENALOG ) 0.1 % as needed.   No facility-administered encounter medications on file as of 08/26/2024.

## 2024-08-26 ENCOUNTER — Ambulatory Visit (INDEPENDENT_AMBULATORY_CARE_PROVIDER_SITE_OTHER): Admitting: Family Medicine

## 2024-08-26 ENCOUNTER — Encounter: Payer: Self-pay | Admitting: Family Medicine

## 2024-08-26 VITALS — BP 100/62 | HR 69 | Temp 97.7°F | Ht 67.5 in | Wt 191.2 lb

## 2024-08-26 DIAGNOSIS — Z96652 Presence of left artificial knee joint: Secondary | ICD-10-CM

## 2024-08-26 DIAGNOSIS — H9312 Tinnitus, left ear: Secondary | ICD-10-CM

## 2024-08-26 DIAGNOSIS — G8929 Other chronic pain: Secondary | ICD-10-CM | POA: Diagnosis not present

## 2024-08-26 DIAGNOSIS — M542 Cervicalgia: Secondary | ICD-10-CM

## 2024-08-26 DIAGNOSIS — H8111 Benign paroxysmal vertigo, right ear: Secondary | ICD-10-CM

## 2024-09-23 ENCOUNTER — Other Ambulatory Visit: Payer: Self-pay | Admitting: Family Medicine

## 2024-10-07 ENCOUNTER — Encounter: Payer: Self-pay | Admitting: Family Medicine

## 2024-10-07 ENCOUNTER — Ambulatory Visit (INDEPENDENT_AMBULATORY_CARE_PROVIDER_SITE_OTHER): Admitting: Family Medicine

## 2024-10-07 VITALS — BP 90/68 | HR 84 | Temp 99.1°F | Ht 67.5 in | Wt 188.0 lb

## 2024-10-07 DIAGNOSIS — R062 Wheezing: Secondary | ICD-10-CM

## 2024-10-07 DIAGNOSIS — J208 Acute bronchitis due to other specified organisms: Secondary | ICD-10-CM

## 2024-10-07 MED ORDER — PREDNISONE 20 MG PO TABS: ORAL_TABLET | ORAL | 0 refills | Status: DC

## 2024-10-07 MED ORDER — DOXYCYCLINE HYCLATE 100 MG PO TABS: 100.0000 mg | ORAL_TABLET | Freq: Two times a day (BID) | ORAL | 0 refills | Status: DC

## 2024-10-07 MED ORDER — HYDROCODONE BIT-HOMATROP MBR 5-1.5 MG/5ML PO SOLN: 5.0000 mL | Freq: Every evening | ORAL | 0 refills | Status: DC | PRN

## 2024-10-07 NOTE — Progress Notes (Signed)
 "    Ceri Mayer T. Tara Wich, MD, CAQ Sports Medicine [facility] at [facility] [address] [place], [address]  Phone: [phone]  FAX: [phone]  Angela Moses - 68 y.o. female  MRN [id number]  Date of Birth: [birth year]  Date: 10/07/2024  PCP: Watt Mirza, MD  Referral: Watt Mirza, MD  Chief Complaint  Patient presents with   Cough    X 2 weeks   Nasal Congestion        Subjective:   Angela Moses is a 68 y.o. very pleasant female patient with Body mass index is 29.01 kg/m. who presents with the following:  Discussed the use of AI scribe software for clinical note transcription with the patient, who gave verbal consent to proceed.   History of Present Illness Angela Moses is a 68 year old female who presents with a two-week history of cough and congestion.  She has been experiencing cough and congestion for the past two weeks. Her head feels congested.  No headaches or sore throat, although she initially had a scratchy throat that improved with activity and hydration. No earaches, but she notes a sensation of fluid behind her ear.  She has a history of migraines, with the last episode occurring the Friday before her current symptoms began. She experienced a headache that morning, anticipated it would be severe, and vomited once. She managed the migraine with medication and rest, resolving by the following Monday when her cold symptoms began.  No nausea, vomiting, diarrhea, or shortness of breath. She has not exerted herself much in the past two weeks, although she continues [place] and [place]. She experiences coughing fits, particularly at night, which have been severe enough to disrupt activities.  She has a history of bronchitis but denies any history of asthma or other lung problems. She has not taken a COVID or flu test recently, although she received a flu shot earlier.    Review of Systems is noted in  the HPI, as appropriate  Objective:   BP 90/68   Pulse 84   Temp 99.1 F (37.3 C) (Temporal)   Ht 5' 7.5 (1.715 m)   Wt 188 lb (85.3 kg)   SpO2 95%   BMI 29.01 kg/m    Gen: WDWN, NAD. Globally Non-toxic HEENT: Throat clear, w/o exudate, R TM clear, L TM - good landmarks, No fluid present. rhinnorhea.  MMM Frontal sinuses: NT Max sinuses: NT NECK: Anterior cervical  LAD is absent CV: RRR, No M/G/R, cap refill <2 sec PULM: Breathing comfortably in no respiratory distress.  Diffuse wheezing without crackles Laboratory and Imaging Data:  Assessment and Plan:     ICD-10-CM   1. Acute bronchitis due to other specified organisms  J20.8     2. Wheezing  R06.2      Assessment & Plan Acute bronchitis Wheezing and congestion for two weeks. Oxygen saturation at 95%. Likely progression from a cold.  Cannot exclude COVID-19 or influenza -at 2 weeks, testing would likely be inaccurate. - Prescribed doxycycline  100 mg, one tablet twice a day for ten days. - Prescribed prednisone 20 mg, two tablets for four days, then one tablet for four days. - Prescribed hycodan cough syrup, one teaspoon at bedtime.  Medication Management during today's office visit: Meds ordered this encounter  Medications   doxycycline  (VIBRA -TABS) 100 MG tablet    Sig: Take 1 tablet (100 mg total) by mouth 2 (two) times daily.  Dispense:  20 tablet    Refill:  0   predniSONE (DELTASONE) 20 MG tablet    Sig: 2 tabs po for 4 days, then 1 tab po for 4 days    Dispense:  12 tablet    Refill:  0   HYDROcodone  bit-homatropine (HYCODAN) 5-1.5 MG/5ML syrup    Sig: Take 5 mLs by mouth at bedtime as needed for cough.    Dispense:  80 mL    Refill:  0   There are no discontinued medications.  Orders placed today for conditions managed today: No orders of the defined types were placed in this encounter.   Disposition: No follow-ups on file.  [employer] speech-to-text software was used for  transcription in this dictation.  Possible transcriptional errors can occur using Animal nutritionist.   Signed,  Jacques DASEN. Cherisse Carrell, MD   Outpatient Encounter Medications as of 10/07/2024  Medication Sig   atorvastatin  (LIPITOR) 20 MG tablet TAKE ONE TABLET BY MOUTH ONCE A DAY   doxycycline  (VIBRA -TABS) 100 MG tablet Take 1 tablet (100 mg total) by mouth 2 (two) times daily.   HYDROcodone  bit-homatropine (HYCODAN) 5-1.5 MG/5ML syrup Take 5 mLs by mouth at bedtime as needed for cough.   Multiple Vitamin (MULTIVITAMIN WITH MINERALS) TABS tablet Take 1 tablet by mouth daily.   predniSONE (DELTASONE) 20 MG tablet 2 tabs po for 4 days, then 1 tab po for 4 days   triamcinolone  cream (KENALOG ) 0.1 % as needed.   No facility-administered encounter medications on file as of 10/07/2024.   "

## 2024-10-14 ENCOUNTER — Telehealth: Payer: Self-pay | Admitting: *Deleted

## 2024-10-14 DIAGNOSIS — Z131 Encounter for screening for diabetes mellitus: Secondary | ICD-10-CM

## 2024-10-14 DIAGNOSIS — E782 Mixed hyperlipidemia: Secondary | ICD-10-CM

## 2024-10-14 DIAGNOSIS — Z79899 Other long term (current) drug therapy: Secondary | ICD-10-CM

## 2024-10-14 NOTE — Telephone Encounter (Signed)
-----   Message from Veva JINNY Ferrari sent at 10/11/2024  8:52 AM EST ----- Regarding: Lab orders for Mon, 1.12.26 Patient is scheduled for CPX labs, please order future labs, Thanks , Veva

## 2024-10-21 ENCOUNTER — Other Ambulatory Visit (INDEPENDENT_AMBULATORY_CARE_PROVIDER_SITE_OTHER)

## 2024-10-21 DIAGNOSIS — Z131 Encounter for screening for diabetes mellitus: Secondary | ICD-10-CM | POA: Diagnosis not present

## 2024-10-21 DIAGNOSIS — E782 Mixed hyperlipidemia: Secondary | ICD-10-CM | POA: Diagnosis not present

## 2024-10-21 DIAGNOSIS — Z79899 Other long term (current) drug therapy: Secondary | ICD-10-CM

## 2024-10-21 LAB — HEMOGLOBIN A1C: Hgb A1c MFr Bld: 6.1 % (ref 4.6–6.5)

## 2024-10-21 LAB — HEPATIC FUNCTION PANEL
ALT: 22 U/L (ref 3–35)
AST: 18 U/L (ref 5–37)
Albumin: 3.9 g/dL (ref 3.5–5.2)
Alkaline Phosphatase: 85 U/L (ref 39–117)
Bilirubin, Direct: 0.1 mg/dL (ref 0.1–0.3)
Total Bilirubin: 0.7 mg/dL (ref 0.2–1.2)
Total Protein: 6.5 g/dL (ref 6.0–8.3)

## 2024-10-21 LAB — CBC WITH DIFFERENTIAL/PLATELET
Basophils Absolute: 0 K/uL (ref 0.0–0.1)
Basophils Relative: 0.4 % (ref 0.0–3.0)
Eosinophils Absolute: 0.3 K/uL (ref 0.0–0.7)
Eosinophils Relative: 3.1 % (ref 0.0–5.0)
HCT: 41.7 % (ref 36.0–46.0)
Hemoglobin: 13.6 g/dL (ref 12.0–15.0)
Lymphocytes Relative: 12.1 % (ref 12.0–46.0)
Lymphs Abs: 1.1 K/uL (ref 0.7–4.0)
MCHC: 32.5 g/dL (ref 30.0–36.0)
MCV: 83.3 fl (ref 78.0–100.0)
Monocytes Absolute: 0.5 K/uL (ref 0.1–1.0)
Monocytes Relative: 5.8 % (ref 3.0–12.0)
Neutro Abs: 6.9 K/uL (ref 1.4–7.7)
Neutrophils Relative %: 78.6 % — ABNORMAL HIGH (ref 43.0–77.0)
Platelets: 225 K/uL (ref 150.0–400.0)
RBC: 5.01 Mil/uL (ref 3.87–5.11)
RDW: 21.9 % — ABNORMAL HIGH (ref 11.5–15.5)
WBC: 8.8 K/uL (ref 4.0–10.5)

## 2024-10-21 LAB — LIPID PANEL
Cholesterol: 161 mg/dL (ref 28–200)
HDL: 59.5 mg/dL
LDL Cholesterol: 72 mg/dL (ref 10–99)
NonHDL: 101.04
Total CHOL/HDL Ratio: 3
Triglycerides: 146 mg/dL (ref 10.0–149.0)
VLDL: 29.2 mg/dL (ref 0.0–40.0)

## 2024-10-21 LAB — BASIC METABOLIC PANEL WITH GFR
BUN: 12 mg/dL (ref 6–23)
CO2: 30 meq/L (ref 19–32)
Calcium: 9 mg/dL (ref 8.4–10.5)
Chloride: 106 meq/L (ref 96–112)
Creatinine, Ser: 0.99 mg/dL (ref 0.40–1.20)
GFR: 59.53 mL/min — ABNORMAL LOW
Glucose, Bld: 100 mg/dL — ABNORMAL HIGH (ref 70–99)
Potassium: 4.2 meq/L (ref 3.5–5.1)
Sodium: 142 meq/L (ref 135–145)

## 2024-10-21 LAB — TSH: TSH: 1.34 u[IU]/mL (ref 0.35–5.50)

## 2024-10-27 NOTE — Progress Notes (Unsigned)
 "    Angela Mood T. Taraann Olthoff, MD, CAQ Sports Medicine [facility] at [facility] [address] [place], [address]  Phone: [phone]  FAX: [phone]  Angela Moses - 69 y.o. female  MRN [id number]  Date of Birth: [birth year]  Date: 10/28/2024  PCP: Watt Mirza, MD  Referral: Watt Mirza, MD  No chief complaint on file.  Patient Care Team: Watt Mirza, MD as PCP - General Subjective:   Angela Moses is a 70 y.o. pleasant patient who presents for a medicare wellness examination:  Health Maintenance Summary Reviewed and updated, unless pt declines services.  Tobacco History Reviewed. Non-smoker Alcohol: No concerns, no excessive use Exercise Habits: Some activity, rec at least 30 mins 5 times a week STD concerns: none Drug Use: None Birth control method: n/a Menses regular: n/a Lumps or breast concerns: no Breast Cancer Family History: no  Discussed the use of AI scribe software for clinical note transcription with the patient, who gave verbal consent to proceed.  History of Present Illness   I generally healthy 69 year old patient.  Prevnar 20 COVID booster Bone density  Health Maintenance  Topic Date Due   Pneumococcal Vaccine: 50+ Years (1 of 1 - PCV) Never done   COVID-19 Vaccine (3 - Pfizer risk series) 04/02/2020   Bone Density Scan  Never done   Medicare Annual Wellness (AWV)  10/24/2024   Mammogram  08/12/2026   Colonoscopy  12/03/2031   DTaP/Tdap/Td (5 - Td or Tdap) 06/06/2033   Influenza Vaccine  Completed   Hepatitis C Screening  Completed   Zoster Vaccines- Shingrix  Completed   Meningococcal B Vaccine  Aged Out   Immunization History  Administered Date(s) Administered   INFLUENZA, HIGH DOSE SEASONAL PF 08/13/2024   Influenza Whole 07/10/2010, 10/01/2010   Influenza, Seasonal, Injecte, Preservative Fre 06/15/2023   Influenza,inj,Quad PF,6+ Mos 08/17/2015, 08/23/2017, 07/29/2019, 08/17/2020,  08/23/2021, 08/25/2022   Influenza-Unspecified 07/10/2018   PFIZER(Purple Top)SARS-COV-2 Vaccination 02/13/2020, 03/05/2020   Td 03/10/2006, 10/01/2010   Tdap 08/23/2021, 06/07/2023   Zoster Recombinant(Shingrix) 08/23/2021, 11/24/2021    Patient Active Problem List   Diagnosis Date Noted   Mixed hyperlipidemia 06/24/2009    Priority: Medium    Psoriasis 03/11/2015    Priority: Low   CERVICALGIA 12/27/2010    Past Medical History:  Diagnosis Date   DVT (deep venous thrombosis) (HCC)    BLOOD CLOT RIGHT CALF AFTER KNEE REPLACEMENT   HLD (hyperlipidemia)    Psoriasis 03/11/2015    Past Surgical History:  Procedure Laterality Date   ABDOMINAL HYSTERECTOMY  10/11/1995   EXCISION HAGLUND'S DEFORMITY WITH ACHILLES TENDON REPAIR Right 08/08/2013   Procedure: RIGHT EXCISION HAGLUND'S DEFORMITY  WITH ACHILLES DEBRIDEMENT AND RECONSTRUCTION;  Surgeon: Norleen Armor, MD;  Location: [facility];  Service: Orthopedics;  Laterality: Right;   GASTROC RECESSION EXTREMITY Right 08/08/2013   Procedure: GASTROCNEMIUS RECESSION ;  Surgeon: Norleen Armor, MD;  Location: [facility];  Service: Orthopedics;  Laterality: Right;   KNEE CLOSED REDUCTION Left 10/30/2017   Procedure: CLOSED MANIPULATION KNEE;  Surgeon: Melodi Lerner, MD;  Location: [facility];  Service: Orthopedics;  Laterality: Left;   TONSILLECTOMY     TOTAL KNEE ARTHROPLASTY Left 09/11/2017   Procedure: LEFT TOTAL KNEE ARTHROPLASTY;  Surgeon: Melodi Lerner, MD;  Location: [facility];  Service: Orthopedics;  Laterality: Left;   TOTAL KNEE ARTHROPLASTY Right    [id number]   TYMPANIC MEMBRANE REPAIR     TYMPANOSTOMY TUBE PLACEMENT  lt     Family History  Problem Relation Age of Onset   Dementia Mother    Diabetes Mother    Parkinsonism Father    Macular degeneration Father    Breast cancer Maternal Grandmother        cause of death   Heart attack Maternal Grandfather        cause of death   Breast cancer  Paternal Grandmother        cause of death   Heart attack Paternal Grandfather        cause of death   Colon cancer Neg Hx    Colon polyps Neg Hx    Esophageal cancer Neg Hx    Rectal cancer Neg Hx    Stomach cancer Neg Hx     Social History   Social History Narrative   Not on file    Past Medical History, Surgical History, Social History, Family History, Problem List, Medications, and Allergies have been reviewed and updated if relevant.  Review of Systems: Pertinent positives are listed above.  Otherwise, a full 14 point review of systems has been done in full and it is negative except where it is noted positive.  Objective:   There were no vitals taken for this visit.    11/09/2022    2:08 PM 03/22/2024    9:07 AM  Fall Risk  Falls in the past year? 0 0  Was there an injury with Fall? 0  0   Fall Risk Category Calculator 0 0  Patient at Risk for Falls Due to No Fall Risks No Fall Risks  Fall risk Follow up Falls evaluation completed Falls evaluation completed     Data saved with a previous flowsheet row definition   Ideal Body Weight:   No results found.    03/22/2024    9:07 AM 11/09/2022    2:09 PM 08/25/2022    9:53 AM 08/23/2021   10:12 AM 08/17/2020    2:32 PM  Depression screen PHQ 2/9  Decreased Interest 0 0 0 0 0  Down, Depressed, Hopeless 0 0 0 0 0  PHQ - 2 Score 0 0 0 0 0     GEN: well developed, well nourished, no acute distress Eyes: conjunctiva and lids normal, PERRLA, EOMI ENT: TM clear, nares clear, oral exam WNL Neck: supple, no lymphadenopathy, no thyromegaly, no JVD Pulm: clear to auscultation and percussion, respiratory effort normal CV: regular rate and rhythm, S1-S2, no murmur, rub or gallop, no bruits Chest: no scars, masses, no lumps BREAST: breast exam declined GI: soft, non-tender; no hepatosplenomegaly, masses; active bowel sounds all quadrants GU: GU exam declined Lymph: no cervical, axillary or inguinal adenopathy MSK: gait  normal, muscle tone and strength WNL, no joint swelling, effusions, discoloration, crepitus  SKIN: clear, good turgor, color WNL, no rashes, lesions, or ulcerations Neuro: normal mental status, normal strength, sensation, and motion Psych: alert; oriented to person, place and time, normally interactive and not anxious or depressed in appearance.   All labs reviewed with patient.  Lipids: Lab Results  Component Value Date   CHOL 161 10/21/2024   Lab Results  Component Value Date   HDL 59.50 10/21/2024   Lab Results  Component Value Date   LDLCALC 72 10/21/2024   Lab Results  Component Value Date   TRIG 146.0 10/21/2024   Lab Results  Component Value Date   CHOLHDL 3 10/21/2024   CBC:    Latest Ref Rng & Units 10/21/2024  9:04 AM 10/18/2023    8:16 AM 08/18/2022    8:00 AM  CBC  WBC 4.0 - 10.5 K/uL 8.8  6.8  6.6   Hemoglobin 12.0 - 15.0 g/dL 86.3  88.2  85.6   Hematocrit 36.0 - 46.0 % 41.7  36.8  43.9   Platelets 150.0 - 400.0 K/uL 225.0  279.0  209.0     Basic Metabolic Panel:    Component Value Date/Time   NA 142 10/21/2024 0904   K 4.2 10/21/2024 0904   CL 106 10/21/2024 0904   CO2 30 10/21/2024 0904   BUN 12 10/21/2024 0904   CREATININE 0.99 10/21/2024 0904   GLUCOSE 100 (H) 10/21/2024 0904   CALCIUM  9.0 10/21/2024 0904      Latest Ref Rng & Units 10/21/2024    9:04 AM 10/18/2023    8:16 AM 08/18/2022    8:00 AM  Hepatic Function  Total Protein 6.0 - 8.3 g/dL 6.5  6.3  6.4   Albumin 3.5 - 5.2 g/dL 3.9  4.0  4.0   AST 5 - 37 U/L 18  15  17    ALT 3 - 35 U/L 22  17  18    Alk Phosphatase 39 - 117 U/L 85  97  95   Total Bilirubin 0.2 - 1.2 mg/dL 0.7  0.4  0.6   Bilirubin, Direct 0.1 - 0.3 mg/dL 0.1  0.1  0.1     Lab Results  Component Value Date   HGBA1C 6.1 10/21/2024   Lab Results  Component Value Date   TSH 1.34 10/21/2024    No results found.  Assessment and Plan:     ICD-10-CM   1. Healthcare maintenance  Z00.00       Assessment and  Plan Assessment & Plan      Health Maintenance Exam: The patient's preventative maintenance and recommended screening tests for an annual wellness exam were reviewed in full today. Brought up to date unless services declined.  Counselled on the importance of diet, exercise, and its role in overall health and mortality. The patient's FH and SH was reviewed, including their home life, tobacco status, and drug and alcohol status.  Follow-up in 1 year for physical exam or additional follow-up below.  I have personally reviewed the Medicare Annual Wellness questionnaire and have noted 1. The patient's medical and social history 2. Their use of alcohol, tobacco or illicit drugs 3. Their current medications and supplements 4. The patient's functional ability including ADL's, fall risks, home safety risks and hearing or visual             impairment. 5. Diet and physical activities 6. Evidence for depression or mood disorders 7. Reviewed Updated provider list, see scanned forms and [facility] Snapshot.  8. Reviewed whether or not the patient has HCPOA or living will, and discussed what this means with the patient.  Recommended she bring in a copy for his chart in [facility].  The patients weight, height, BMI and visual acuity have been recorded in the chart I have made referrals, counseling and provided education to the patient based review of the above and I have provided the pt with a written personalized care plan for preventive services.  I have provided the patient with a copy of your personalized plan for preventive services. Instructed to take the time to review along with their updated medication list.  Disposition: No follow-ups on file.  Future Appointments  Date Time Provider Department Center  10/28/2024  9:40 AM  Watt Mirza, MD [facility] [id number]  11/26/2024  8:30 AM Tobie Eldora NOVAK, MD [facility] None    No orders of the defined types were placed in this encounter.  There are no  discontinued medications. No orders of the defined types were placed in this encounter.   Signed,  Mirza DASEN. Ramesh Moan, MD   Allergies as of 10/28/2024       Reactions   Latex Swelling        Medication List        Accurate as of October 27, 2024  9:50 AM. If you have any questions, ask your nurse or doctor.          atorvastatin  20 MG tablet Commonly known as: LIPITOR TAKE ONE TABLET BY MOUTH ONCE A DAY   doxycycline  100 MG tablet Commonly known as: VIBRA -TABS Take 1 tablet (100 mg total) by mouth 2 (two) times daily.   HYDROcodone  bit-homatropine 5-1.5 MG/5ML syrup Commonly known as: HYCODAN Take 5 mLs by mouth at bedtime as needed for cough.   multivitamin with minerals Tabs tablet Take 1 tablet by mouth daily.   predniSONE  20 MG tablet Commonly known as: DELTASONE  2 tabs po for 4 days, then 1 tab po for 4 days   triamcinolone  cream 0.1 % Commonly known as: KENALOG  as needed.        "

## 2024-10-28 ENCOUNTER — Encounter: Admitting: Family Medicine

## 2024-10-28 DIAGNOSIS — Z0000 Encounter for general adult medical examination without abnormal findings: Secondary | ICD-10-CM

## 2024-10-29 NOTE — Progress Notes (Unsigned)
 "    Angela Rowzee T. Jsaon Yoo, MD, CAQ Sports Medicine [facility] [address] [place], [address]  Phone: [phone]  FAX: [phone]  Angela Moses - 69 y.o. female  MRN [id number]  Date of Birth: 1955/11/22  Date: 10/30/2024  PCP: Watt Mirza, MD  Referral: Watt Mirza, MD  No chief complaint on file.  Patient Care Team: Watt Mirza, MD as PCP - General Subjective:   Angela Moses is a 69 y.o. pleasant patient who presents for a medicare wellness examination:  Health Maintenance Summary Reviewed and updated, unless pt declines services.  Tobacco History Reviewed. Non-smoker Alcohol: No concerns, no excessive use Exercise Habits: Some activity, rec at least 30 mins 5 times a week STD concerns: none Drug Use: None Birth control method: n/a Menses regular: n/a Lumps or breast concerns: no Breast Cancer Family History: no  Discussed the use of AI scribe software for clinical note transcription with the patient, who gave verbal consent to proceed.  History of Present Illness   I generally healthy 69 year old patient.  Prevnar 20 COVID booster Bone density  Health Maintenance  Topic Date Due   Pneumococcal Vaccine: 50+ Years (1 of 1 - PCV) Never done   COVID-19 Vaccine (3 - Pfizer risk series) 04/02/2020   Bone Density Scan  Never done   Medicare Annual Wellness (AWV)  10/24/2024   Mammogram  08/12/2026   Colonoscopy  12/03/2031   DTaP/Tdap/Td (5 - Td or Tdap) 06/06/2033   Influenza Vaccine  Completed   Hepatitis C Screening  Completed   Zoster Vaccines- Shingrix  Completed   Meningococcal B Vaccine  Aged Out   Immunization History  Administered Date(s) Administered   INFLUENZA, HIGH DOSE SEASONAL PF 08/13/2024   Influenza Whole 07/10/2010, 10/01/2010   Influenza, Seasonal, Injecte, Preservative Fre 06/15/2023   Influenza,inj,Quad PF,6+ Mos 08/17/2015, 08/23/2017, 07/29/2019, 08/17/2020,  08/23/2021, 08/25/2022   Influenza-Unspecified 07/10/2018   PFIZER(Purple Top)SARS-COV-2 Vaccination 02/13/2020, 03/05/2020   Td 03/10/2006, 10/01/2010   Tdap 08/23/2021, 06/07/2023   Zoster Recombinant(Shingrix) 08/23/2021, 11/24/2021    Patient Active Problem List   Diagnosis Date Noted   Mixed hyperlipidemia 06/24/2009    Priority: Medium    Psoriasis 03/11/2015    Priority: Low   CERVICALGIA 12/27/2010    Past Medical History:  Diagnosis Date   DVT (deep venous thrombosis) (HCC)    BLOOD CLOT RIGHT CALF AFTER KNEE REPLACEMENT   HLD (hyperlipidemia)    Psoriasis 03/11/2015    Past Surgical History:  Procedure Laterality Date   ABDOMINAL HYSTERECTOMY  10/11/1995   EXCISION HAGLUND'S DEFORMITY WITH ACHILLES TENDON REPAIR Right 08/08/2013   Procedure: RIGHT EXCISION HAGLUND'S DEFORMITY  WITH ACHILLES DEBRIDEMENT AND RECONSTRUCTION;  Surgeon: Norleen Armor, MD;  Location: [facility];  Service: Orthopedics;  Laterality: Right;   GASTROC RECESSION EXTREMITY Right 08/08/2013   Procedure: GASTROCNEMIUS RECESSION ;  Surgeon: Norleen Armor, MD;  Location: [facility];  Service: Orthopedics;  Laterality: Right;   KNEE CLOSED REDUCTION Left 10/30/2017   Procedure: CLOSED MANIPULATION KNEE;  Surgeon: Melodi Lerner, MD;  Location: [facility];  Service: Orthopedics;  Laterality: Left;   TONSILLECTOMY     TOTAL KNEE ARTHROPLASTY Left 09/11/2017   Procedure: LEFT TOTAL KNEE ARTHROPLASTY;  Surgeon: Melodi Lerner, MD;  Location: [facility];  Service: Orthopedics;  Laterality: Left;   TOTAL KNEE ARTHROPLASTY Right    [id number]   TYMPANIC MEMBRANE REPAIR     TYMPANOSTOMY TUBE PLACEMENT  lt     Family History  Problem Relation Age of Onset   Dementia Mother    Diabetes Mother    Parkinsonism Father    Macular degeneration Father    Breast cancer Maternal Grandmother        cause of death   Heart attack Maternal Grandfather        cause of death   Breast cancer  Paternal Grandmother        cause of death   Heart attack Paternal Grandfather        cause of death   Colon cancer Neg Hx    Colon polyps Neg Hx    Esophageal cancer Neg Hx    Rectal cancer Neg Hx    Stomach cancer Neg Hx     Social History   Social History Narrative   Not on file    Past Medical History, Surgical History, Social History, Family History, Problem List, Medications, and Allergies have been reviewed and updated if relevant.  Review of Systems: Pertinent positives are listed above.  Otherwise, a full 14 point review of systems has been done in full and it is negative except where it is noted positive.  Objective:   There were no vitals taken for this visit.    11/09/2022    2:08 PM 03/22/2024    9:07 AM  Fall Risk  Falls in the past year? 0 0  Was there an injury with Fall? 0  0   Fall Risk Category Calculator 0 0  Patient at Risk for Falls Due to No Fall Risks No Fall Risks  Fall risk Follow up Falls evaluation completed Falls evaluation completed     Data saved with a previous flowsheet row definition   Ideal Body Weight:   No results found.    03/22/2024    9:07 AM 11/09/2022    2:09 PM 08/25/2022    9:53 AM 08/23/2021   10:12 AM 08/17/2020    2:32 PM  Depression screen PHQ 2/9  Decreased Interest 0 0 0 0 0  Down, Depressed, Hopeless 0 0 0 0 0  PHQ - 2 Score 0 0 0 0 0     GEN: well developed, well nourished, no acute distress Eyes: conjunctiva and lids normal, PERRLA, EOMI ENT: TM clear, nares clear, oral exam WNL Neck: supple, no lymphadenopathy, no thyromegaly, no JVD Pulm: clear to auscultation and percussion, respiratory effort normal CV: regular rate and rhythm, S1-S2, no murmur, rub or gallop, no bruits Chest: no scars, masses, no lumps BREAST: breast exam declined GI: soft, non-tender; no hepatosplenomegaly, masses; active bowel sounds all quadrants GU: GU exam declined Lymph: no cervical, axillary or inguinal adenopathy MSK: gait  normal, muscle tone and strength WNL, no joint swelling, effusions, discoloration, crepitus  SKIN: clear, good turgor, color WNL, no rashes, lesions, or ulcerations Neuro: normal mental status, normal strength, sensation, and motion Psych: alert; oriented to person, place and time, normally interactive and not anxious or depressed in appearance.   All labs reviewed with patient.  Lipids: Lab Results  Component Value Date   CHOL 161 10/21/2024   Lab Results  Component Value Date   HDL 59.50 10/21/2024   Lab Results  Component Value Date   LDLCALC 72 10/21/2024   Lab Results  Component Value Date   TRIG 146.0 10/21/2024   Lab Results  Component Value Date   CHOLHDL 3 10/21/2024   CBC:    Latest Ref Rng & Units 10/21/2024  9:04 AM 10/18/2023    8:16 AM 08/18/2022    8:00 AM  CBC  WBC 4.0 - 10.5 K/uL 8.8  6.8  6.6   Hemoglobin 12.0 - 15.0 g/dL 86.3  88.2  85.6   Hematocrit 36.0 - 46.0 % 41.7  36.8  43.9   Platelets 150.0 - 400.0 K/uL 225.0  279.0  209.0     Basic Metabolic Panel:    Component Value Date/Time   NA 142 10/21/2024 0904   K 4.2 10/21/2024 0904   CL 106 10/21/2024 0904   CO2 30 10/21/2024 0904   BUN 12 10/21/2024 0904   CREATININE 0.99 10/21/2024 0904   GLUCOSE 100 (H) 10/21/2024 0904   CALCIUM  9.0 10/21/2024 0904      Latest Ref Rng & Units 10/21/2024    9:04 AM 10/18/2023    8:16 AM 08/18/2022    8:00 AM  Hepatic Function  Total Protein 6.0 - 8.3 g/dL 6.5  6.3  6.4   Albumin 3.5 - 5.2 g/dL 3.9  4.0  4.0   AST 5 - 37 U/L 18  15  17    ALT 3 - 35 U/L 22  17  18    Alk Phosphatase 39 - 117 U/L 85  97  95   Total Bilirubin 0.2 - 1.2 mg/dL 0.7  0.4  0.6   Bilirubin, Direct 0.1 - 0.3 mg/dL 0.1  0.1  0.1     Lab Results  Component Value Date   HGBA1C 6.1 10/21/2024   Lab Results  Component Value Date   TSH 1.34 10/21/2024    No results found.  Assessment and Plan:   No diagnosis found.   Assessment and Plan Assessment &  Plan      Health Maintenance Exam: The patient's preventative maintenance and recommended screening tests for an annual wellness exam were reviewed in full today. Brought up to date unless services declined.  Counselled on the importance of diet, exercise, and its role in overall health and mortality. The patient's FH and SH was reviewed, including their home life, tobacco status, and drug and alcohol status.  Follow-up in 1 year for physical exam or additional follow-up below.  I have personally reviewed the Medicare Annual Wellness questionnaire and have noted 1. The patient's medical and social history 2. Their use of alcohol, tobacco or illicit drugs 3. Their current medications and supplements 4. The patient's functional ability including ADL's, fall risks, home safety risks and hearing or visual             impairment. 5. Diet and physical activities 6. Evidence for depression or mood disorders 7. Reviewed Updated provider list, see scanned forms and [facility] Snapshot.  8. Reviewed whether or not the patient has HCPOA or living will, and discussed what this means with the patient.  Recommended she bring in a copy for his chart in [facility].  The patients weight, height, BMI and visual acuity have been recorded in the chart I have made referrals, counseling and provided education to the patient based review of the above and I have provided the pt with a written personalized care plan for preventive services.  I have provided the patient with a copy of your personalized plan for preventive services. Instructed to take the time to review along with their updated medication list.  Disposition: No follow-ups on file.  Future Appointments  Date Time Provider Department Center  10/30/2024 11:20 AM Watt Mirza, MD [facility] [id number]  11/26/2024  8:30 AM Tobie,  Eldora NOVAK, MD [facility] None    No orders of the defined types were placed in this encounter.  There are no discontinued  medications. No orders of the defined types were placed in this encounter.   Signed,  Jacques DASEN. Jessee Newnam, MD   Allergies as of 10/30/2024       Reactions   Latex Swelling        Medication List        Accurate as of October 29, 2024  9:39 AM. If you have any questions, ask your nurse or doctor.          atorvastatin  20 MG tablet Commonly known as: LIPITOR TAKE ONE TABLET BY MOUTH ONCE A DAY   doxycycline  100 MG tablet Commonly known as: VIBRA -TABS Take 1 tablet (100 mg total) by mouth 2 (two) times daily.   HYDROcodone  bit-homatropine 5-1.5 MG/5ML syrup Commonly known as: HYCODAN Take 5 mLs by mouth at bedtime as needed for cough.   multivitamin with minerals Tabs tablet Take 1 tablet by mouth daily.   predniSONE  20 MG tablet Commonly known as: DELTASONE  2 tabs po for 4 days, then 1 tab po for 4 days   triamcinolone  cream 0.1 % Commonly known as: KENALOG  as needed.        "

## 2024-10-30 ENCOUNTER — Ambulatory Visit: Admitting: Family Medicine

## 2024-10-30 ENCOUNTER — Encounter: Payer: Self-pay | Admitting: Family Medicine

## 2024-10-30 VITALS — BP 90/60 | HR 67 | Temp 98.4°F | Ht 67.56 in | Wt 187.0 lb

## 2024-10-30 DIAGNOSIS — Z0000 Encounter for general adult medical examination without abnormal findings: Secondary | ICD-10-CM

## 2024-10-30 DIAGNOSIS — Z23 Encounter for immunization: Secondary | ICD-10-CM

## 2024-11-26 ENCOUNTER — Institutional Professional Consult (permissible substitution) (INDEPENDENT_AMBULATORY_CARE_PROVIDER_SITE_OTHER): Admitting: Otolaryngology

## 2025-04-16 ENCOUNTER — Institutional Professional Consult (permissible substitution) (INDEPENDENT_AMBULATORY_CARE_PROVIDER_SITE_OTHER): Admitting: Otolaryngology
